# Patient Record
Sex: Male | Born: 1955
Health system: Southern US, Community
[De-identification: ages and names within clinical notes are randomized; demographics above are authoritative.]

## PROBLEM LIST (undated history)

## (undated) DIAGNOSIS — M1990 Unspecified osteoarthritis, unspecified site: Secondary | ICD-10-CM

## (undated) DIAGNOSIS — I10 Essential (primary) hypertension: Secondary | ICD-10-CM

## (undated) DIAGNOSIS — N200 Calculus of kidney: Secondary | ICD-10-CM

## (undated) DIAGNOSIS — J449 Chronic obstructive pulmonary disease, unspecified: Secondary | ICD-10-CM

## (undated) DIAGNOSIS — G4730 Sleep apnea, unspecified: Secondary | ICD-10-CM

## (undated) DIAGNOSIS — E0590 Thyrotoxicosis, unspecified without thyrotoxic crisis or storm: Secondary | ICD-10-CM

## (undated) DIAGNOSIS — E785 Hyperlipidemia, unspecified: Secondary | ICD-10-CM

## (undated) DIAGNOSIS — E039 Hypothyroidism, unspecified: Secondary | ICD-10-CM

## (undated) DIAGNOSIS — F419 Anxiety disorder, unspecified: Secondary | ICD-10-CM

## (undated) DIAGNOSIS — B1910 Unspecified viral hepatitis B without hepatic coma: Secondary | ICD-10-CM

## (undated) DIAGNOSIS — B1920 Unspecified viral hepatitis C without hepatic coma: Secondary | ICD-10-CM

## (undated) DIAGNOSIS — R0600 Dyspnea, unspecified: Secondary | ICD-10-CM

## (undated) DIAGNOSIS — K219 Gastro-esophageal reflux disease without esophagitis: Secondary | ICD-10-CM

## (undated) DIAGNOSIS — Z87442 Personal history of urinary calculi: Secondary | ICD-10-CM

## (undated) DIAGNOSIS — T24001A Burn of unspecified degree of unspecified site of right lower limb, except ankle and foot, initial encounter: Secondary | ICD-10-CM

## (undated) DIAGNOSIS — G709 Myoneural disorder, unspecified: Secondary | ICD-10-CM

## (undated) DIAGNOSIS — E119 Type 2 diabetes mellitus without complications: Secondary | ICD-10-CM

## (undated) HISTORY — DX: Hyperlipidemia, unspecified: E78.5

## (undated) HISTORY — DX: Burn of unspecified degree of unspecified site of right lower limb, except ankle and foot, initial encounter: T24.001A

## (undated) HISTORY — DX: Sleep apnea, unspecified: G47.30

## (undated) HISTORY — DX: Gastro-esophageal reflux disease without esophagitis: K21.9

## (undated) HISTORY — DX: Unspecified viral hepatitis B without hepatic coma: B19.10

## (undated) HISTORY — DX: Unspecified osteoarthritis, unspecified site: M19.90

## (undated) HISTORY — DX: Unspecified viral hepatitis C without hepatic coma: B19.20

---

## 2002-08-02 ENCOUNTER — Inpatient Hospital Stay (HOSPITAL_COMMUNITY): Admission: EM | Admit: 2002-08-02 | Discharge: 2002-08-04 | Payer: Self-pay | Admitting: Emergency Medicine

## 2002-08-02 ENCOUNTER — Encounter: Payer: Self-pay | Admitting: *Deleted

## 2002-08-02 ENCOUNTER — Encounter: Payer: Self-pay | Admitting: Emergency Medicine

## 2002-11-03 ENCOUNTER — Emergency Department (HOSPITAL_COMMUNITY): Admission: EM | Admit: 2002-11-03 | Discharge: 2002-11-03 | Payer: Self-pay | Admitting: Emergency Medicine

## 2004-01-11 ENCOUNTER — Emergency Department (HOSPITAL_COMMUNITY): Admission: EM | Admit: 2004-01-11 | Discharge: 2004-01-11 | Payer: Self-pay | Admitting: Family Medicine

## 2004-02-03 ENCOUNTER — Emergency Department (HOSPITAL_COMMUNITY): Admission: EM | Admit: 2004-02-03 | Discharge: 2004-02-03 | Payer: Self-pay | Admitting: Family Medicine

## 2005-02-24 ENCOUNTER — Emergency Department (HOSPITAL_COMMUNITY): Admission: EM | Admit: 2005-02-24 | Discharge: 2005-02-24 | Payer: Self-pay | Admitting: Emergency Medicine

## 2005-07-16 ENCOUNTER — Emergency Department (HOSPITAL_COMMUNITY): Admission: EM | Admit: 2005-07-16 | Discharge: 2005-07-16 | Payer: Self-pay | Admitting: Family Medicine

## 2010-05-14 ENCOUNTER — Inpatient Hospital Stay (INDEPENDENT_AMBULATORY_CARE_PROVIDER_SITE_OTHER)
Admission: RE | Admit: 2010-05-14 | Discharge: 2010-05-14 | Disposition: A | Discharge status: Home / Self Care | Payer: Self-pay | Source: Ambulatory Visit | Attending: Emergency Medicine | Admitting: Emergency Medicine

## 2010-05-14 DIAGNOSIS — J40 Bronchitis, not specified as acute or chronic: Secondary | ICD-10-CM

## 2010-05-14 DIAGNOSIS — I10 Essential (primary) hypertension: Secondary | ICD-10-CM

## 2010-05-14 DIAGNOSIS — F17200 Nicotine dependence, unspecified, uncomplicated: Secondary | ICD-10-CM

## 2012-11-15 ENCOUNTER — Encounter (HOSPITAL_COMMUNITY): Payer: Self-pay | Admitting: Emergency Medicine

## 2012-11-15 ENCOUNTER — Emergency Department (HOSPITAL_COMMUNITY)
Admission: EM | Admit: 2012-11-15 | Discharge: 2012-11-15 | Disposition: A | Discharge status: Home / Self Care | Payer: 59 | Attending: Emergency Medicine | Admitting: Emergency Medicine

## 2012-11-15 ENCOUNTER — Emergency Department (HOSPITAL_COMMUNITY): Payer: 59

## 2012-11-15 DIAGNOSIS — R42 Dizziness and giddiness: Secondary | ICD-10-CM | POA: Insufficient documentation

## 2012-11-15 DIAGNOSIS — I10 Essential (primary) hypertension: Secondary | ICD-10-CM

## 2012-11-15 DIAGNOSIS — F17200 Nicotine dependence, unspecified, uncomplicated: Secondary | ICD-10-CM | POA: Insufficient documentation

## 2012-11-15 DIAGNOSIS — R110 Nausea: Secondary | ICD-10-CM | POA: Insufficient documentation

## 2012-11-15 LAB — PROTIME-INR: Prothrombin Time: 12.4 seconds (ref 11.6–15.2)

## 2012-11-15 LAB — CBC
Hemoglobin: 16.6 g/dL (ref 13.0–17.0)
MCH: 31.5 pg (ref 26.0–34.0)
MCHC: 36.2 g/dL — ABNORMAL HIGH (ref 30.0–36.0)
MCV: 87.1 fL (ref 78.0–100.0)
Platelets: 170 10*3/uL (ref 150–400)
RBC: 5.27 MIL/uL (ref 4.22–5.81)
WBC: 7.2 10*3/uL (ref 4.0–10.5)

## 2012-11-15 LAB — COMPREHENSIVE METABOLIC PANEL
ALT: 100 U/L — ABNORMAL HIGH (ref 0–53)
Alkaline Phosphatase: 63 U/L (ref 39–117)
BUN: 14 mg/dL (ref 6–23)
CO2: 23 mEq/L (ref 19–32)
Calcium: 9.1 mg/dL (ref 8.4–10.5)
GFR calc Af Amer: >90 mL/min (ref >90)
GFR calc non Af Amer: >90 mL/min (ref >90)
Glucose, Bld: 111 mg/dL — ABNORMAL HIGH (ref 70–99)
Potassium: 4.1 mEq/L (ref 3.5–5.1)
Total Protein: 7.7 g/dL (ref 6.0–8.3)

## 2012-11-15 LAB — POCT I-STAT, CHEM 8
Chloride: 106 mEq/L (ref 96–112)
Creatinine, Ser: 1.1 mg/dL (ref 0.50–1.35)
Glucose, Bld: 110 mg/dL — ABNORMAL HIGH (ref 70–99)
HCT: 51 % (ref 39.0–52.0)
Hemoglobin: 17.3 g/dL — ABNORMAL HIGH (ref 13.0–17.0)
Potassium: 4.1 mEq/L (ref 3.5–5.1)
TCO2: 24 mmol/L (ref 0–100)

## 2012-11-15 LAB — TROPONIN I: Troponin I: <0.3 ng/mL (ref <0.30)

## 2012-11-15 LAB — DIFFERENTIAL
Eosinophils Absolute: 0 10*3/uL (ref 0.0–0.7)
Eosinophils Relative: 1 % (ref 0–5)
Lymphocytes Relative: 31 % (ref 12–46)
Lymphs Abs: 2.2 10*3/uL (ref 0.7–4.0)
Monocytes Relative: 12 % (ref 3–12)

## 2012-11-15 MED ORDER — MECLIZINE HCL 25 MG PO TABS
25.0000 mg | ORAL_TABLET | Freq: Once | ORAL | Status: AC
  Administered 2012-11-15: 25 mg via ORAL
  Filled 2012-11-15: qty 1

## 2012-11-15 MED ORDER — MECLIZINE HCL 25 MG PO TABS: 25.0000 mg | ORAL_TABLET | Freq: Three times a day (TID) | ORAL | Status: DC | PRN

## 2012-11-15 NOTE — ED Notes (Signed)
Pt reports having dizziness "feels like room is spinning" since Friday. Reports bp 230/110 at home, bp 166/90 at triage. Denies any slurred speech, n/v, or weakness. ekg done at triage.

## 2012-11-15 NOTE — ED Notes (Signed)
Pt taken to CT.

## 2012-11-15 NOTE — ED Provider Notes (Signed)
CSN: [id number]     Arrival date & time 11/15/12  1732 History   First MD Initiated Contact with Patient 11/15/12 1803     Chief Complaint  Patient presents with  . Dizziness  . Hypertension   (Consider location/radiation/quality/duration/timing/severity/associated sxs/prior Treatment) HPI Comments: Patient is a 57 year old male otherwise healthy presents to the emergency department with complaints of dizziness that started this morning after he woke up. States that he feels as if the room is spinning and the sensation is associated with nausea. He has had this in the past however this episode is worse. His wife checked his blood pressure with a portable machine at home and got 230/110. She is very concerned about this and insisted he come to be evaluated. Patient denies to me he is having any chest pain or shortness of breath. He denies any headache. He reports his symptoms are worse with position and movement and are improved with lying still. He denies any ringing in the ears or hearing loss.  Patient is a 57 y.o. male presenting with hypertension. The history is provided by the patient.  Hypertension This is a new problem. The problem occurs constantly. The problem has been gradually worsening. Pertinent negatives include no chest pain. Nothing aggravates the symptoms. Nothing relieves the symptoms. He has tried nothing for the symptoms. The treatment provided no relief.    History reviewed. No pertinent past medical history. History reviewed. No pertinent past surgical history. History reviewed. No pertinent family history. History  Substance Use Topics  . Smoking status: Current Every Day Smoker    Types: Cigarettes  . Smokeless tobacco: Not on file  . Alcohol Use: Yes     Comment: occ    Review of Systems  Cardiovascular: Negative for chest pain.  All other systems reviewed and are negative.    Allergies  Chocolate  Home Medications  No current outpatient prescriptions  on file. BP 166/90  Pulse 94  Temp(Src) 98.1 F (36.7 C) (Oral)  Resp 18  SpO2 94% Physical Exam  Nursing note and vitals reviewed. Constitutional: He is oriented to person, place, and time. He appears well-developed and well-nourished. No distress.  HENT:  Head: Normocephalic and atraumatic.  Mouth/Throat: Oropharynx is clear and moist.  Eyes: EOM are normal. Pupils are equal, round, and reactive to light.  Neck: Normal range of motion.  Cardiovascular: Normal rate, regular rhythm and normal heart sounds.   No murmur heard. Pulmonary/Chest: Effort normal. No respiratory distress. He has no wheezes.  Abdominal: Soft. Bowel sounds are normal. He exhibits no distension. There is no tenderness.  Musculoskeletal: Normal range of motion. He exhibits no edema.  Lymphadenopathy:    He has no cervical adenopathy.  Neurological: He is alert and oriented to person, place, and time. No cranial nerve deficit. He exhibits normal muscle tone. Coordination normal.  Skin: Skin is warm and dry. He is not diaphoretic.    ED Course  Procedures (including critical care time) Labs Review Labs Reviewed  PROTIME-INR  APTT  CBC  DIFFERENTIAL  COMPREHENSIVE METABOLIC PANEL  TROPONIN I   Imaging Review No results found.  EKG Interpretation     Ventricular Rate:  91 PR Interval:  142 QRS Duration: 96 QT Interval:  364 QTC Calculation: 447 R Axis:   50 Text Interpretation:  Normal sinus rhythm Incomplete right bundle branch block Nonspecific ST abnormality Abnormal ECG            MDM  No diagnosis found. Patient is a  58 year old male presents with complaints of dizziness that he describes as a spinning sensation. His blood pressure than running high at home however I think this is more the product of him not feeling well rather than the cause of him not feeling well. Workup today reveals a normal head CT and laboratory studies are all essentially unremarkable. He is feeling better  with meclizine and I feel as though he is stable for discharge. His blood pressure when I reexamined him was 158/98 and I do not feel is in need of any emergent intervention. He will be discharged with meclizine and instructions to followup with his primary Dr. and keep a record of his blood pressures for him to see. He is advised to return if he develops any new or bothersome symptoms.    Geoffery Lyons, MD 11/15/12 2044

## 2012-11-15 NOTE — ED Notes (Signed)
Dr. Delo at bedside. 

## 2012-12-13 ENCOUNTER — Ambulatory Visit (HOSPITAL_COMMUNITY)
Admission: RE | Admit: 2012-12-13 | Discharge: 2012-12-13 | Disposition: A | Discharge status: Home / Self Care | Payer: 59 | Source: Ambulatory Visit | Attending: Internal Medicine | Admitting: Internal Medicine

## 2012-12-13 ENCOUNTER — Other Ambulatory Visit: Payer: Self-pay | Admitting: Internal Medicine

## 2012-12-13 DIAGNOSIS — J40 Bronchitis, not specified as acute or chronic: Secondary | ICD-10-CM

## 2012-12-13 DIAGNOSIS — R059 Cough, unspecified: Secondary | ICD-10-CM | POA: Insufficient documentation

## 2012-12-13 DIAGNOSIS — R05 Cough: Secondary | ICD-10-CM | POA: Insufficient documentation

## 2013-02-02 ENCOUNTER — Emergency Department (INDEPENDENT_AMBULATORY_CARE_PROVIDER_SITE_OTHER): Payer: 59

## 2013-02-02 ENCOUNTER — Encounter (HOSPITAL_COMMUNITY): Payer: Self-pay | Admitting: Emergency Medicine

## 2013-02-02 ENCOUNTER — Emergency Department (INDEPENDENT_AMBULATORY_CARE_PROVIDER_SITE_OTHER)
Admission: EM | Admit: 2013-02-02 | Discharge: 2013-02-02 | Disposition: A | Discharge status: Home / Self Care | Payer: BC Managed Care – PPO | Source: Home / Self Care | Attending: Family Medicine | Admitting: Family Medicine

## 2013-02-02 DIAGNOSIS — M659 Synovitis and tenosynovitis, unspecified: Secondary | ICD-10-CM

## 2013-02-02 DIAGNOSIS — M65839 Other synovitis and tenosynovitis, unspecified forearm: Secondary | ICD-10-CM

## 2013-02-02 HISTORY — DX: Essential (primary) hypertension: I10

## 2013-02-02 MED ORDER — PREDNISONE (PAK) 10 MG PO TABS: ORAL_TABLET | Freq: Every day | ORAL | Status: DC

## 2013-02-02 MED ORDER — HYDROCODONE-ACETAMINOPHEN 5-325 MG PO TABS: 1.0000 | ORAL_TABLET | Freq: Four times a day (QID) | ORAL | Status: DC | PRN

## 2013-02-02 NOTE — ED Provider Notes (Signed)
CSN: [id number]     Arrival date & time 02/02/13  1130 History   First MD Initiated Contact with Patient 02/02/13 1439     No chief complaint on file.  (Consider location/radiation/quality/duration/timing/severity/associated sxs/prior Treatment) Patient is a 57 y.o. male presenting with wrist pain. The history is provided by the patient.  Wrist Pain This is a new problem. The current episode started yesterday. The problem occurs constantly. The problem has been gradually worsening. Exacerbated by: movement, pressure. Nothing relieves the symptoms. He has tried nothing for the symptoms.   Gabriel Coleman is a 57 y.o. male who presents with left wrist pain that started yesterday. The pain is worse today with redness and swelling. The pain and redness extends to the hand. No know injury to the area, no history of gout.   No past medical history on file. No past surgical history on file. No family history on file. History  Substance Use Topics  . Smoking status: Current Every Day Smoker    Types: Cigarettes  . Smokeless tobacco: Not on file  . Alcohol Use: Yes     Comment: occ    Review of Systems Negative except as stated in HPI  Allergies  Chocolate  Home Medications   Current Outpatient Rx  Name  Route  Sig  Dispense  Refill  . meclizine (ANTIVERT) 25 MG tablet   Oral   Take 1 tablet (25 mg total) by mouth 3 (three) times daily as needed.   15 tablet   0    BP 145/90  Pulse 65  Temp(Src) 97.9 F (36.6 C) (Oral)  Resp 16  SpO2 98% Physical Exam  Nursing note and vitals reviewed. Constitutional: He is oriented to person, place, and time. He appears well-developed and well-nourished. No distress.  HENT:  Head: Normocephalic and atraumatic.  Eyes: Conjunctivae and EOM are normal.  Neck: Normal range of motion. Neck supple.  Cardiovascular: Normal rate.   Pulmonary/Chest: Effort normal.  Abdominal: Soft. There is no tenderness.  Musculoskeletal: Normal range of  motion.       Left wrist: He exhibits tenderness and swelling. He exhibits normal range of motion, no deformity and no laceration.       Arms: There is swelling, erythema, increased warmth and tenderness to the left wrist extending to the dorsum of the left hand. There is point tenderness as well to the radial aspect of the left wrist. Pain with range of motion. Radial pulse strong, adequate circulation, good touch sensation.  Neurological: He is alert and oriented to person, place, and time. No cranial nerve deficit.  Skin: Skin is warm and dry.  Psychiatric: He has a normal mood and affect. His behavior is normal.    ED Course  Procedures Dg Wrist Complete Left  02/02/2013   EXAM: LEFT WRIST - COMPLETE 3+ VIEW  COMPARISON:  None.  FINDINGS: Bony density is noted adjacent to the radial styloid and trapezium. This could represent a tiny fracture chip. It appears corticated, this is most likely old. Degenerative changes are present. No acute fracture or evidence of dislocation.  IMPRESSION: Degenerative changes. Old fracture arising from the radial styloid or effusion. No acute abnormality .   Electronically Signed   By: Maisie Fus  Register   On: 02/02/2013 15:08     MDM  Dr. Denyse Coleman in to examine the patient and review x-rays. Patient with swollen, tender, warm left wrist. Consider gout, arthritis, tenosynovitis. Will treat with steroids and pain management. Placed in thumb spica  splint. He is to follow up with Dr. Mina Coleman for further evaluation. I have reviewed this patient's vital signs, nurses notes, appropriate imaging and discussed findings and plan of care with the patient. He voices understanding.         [facility] Gabriel Och, NP 02/02/13 1540

## 2013-02-02 NOTE — ED Provider Notes (Signed)
Medical screening examination/treatment/procedure(s) were performed by a resident physician or non-physician practitioner and as the supervising physician I was immediately available for consultation/collaboration.  Evan Corey, MD    Evan S Corey, MD 02/02/13 2126 

## 2013-02-02 NOTE — ED Notes (Signed)
Wrist pain onset yesterday, no known injury.  Has taken advil for pain

## 2013-06-07 ENCOUNTER — Emergency Department (INDEPENDENT_AMBULATORY_CARE_PROVIDER_SITE_OTHER)
Admission: EM | Admit: 2013-06-07 | Discharge: 2013-06-07 | Disposition: A | Discharge status: Home / Self Care | Payer: BC Managed Care – PPO | Source: Home / Self Care | Attending: Emergency Medicine | Admitting: Emergency Medicine

## 2013-06-07 ENCOUNTER — Emergency Department (INDEPENDENT_AMBULATORY_CARE_PROVIDER_SITE_OTHER): Payer: BC Managed Care – PPO

## 2013-06-07 ENCOUNTER — Encounter (HOSPITAL_COMMUNITY): Payer: Self-pay | Admitting: Emergency Medicine

## 2013-06-07 DIAGNOSIS — R42 Dizziness and giddiness: Secondary | ICD-10-CM

## 2013-06-07 LAB — POCT I-STAT, CHEM 8
BUN: 13 mg/dL (ref 6–23)
CALCIUM ION: 1.23 mmol/L (ref 1.12–1.23)
Chloride: 106 mEq/L (ref 96–112)
Creatinine, Ser: 0.9 mg/dL (ref 0.50–1.35)
Glucose, Bld: 104 mg/dL — ABNORMAL HIGH (ref 70–99)
HEMATOCRIT: 52 % (ref 39.0–52.0)
Hemoglobin: 17.7 g/dL — ABNORMAL HIGH (ref 13.0–17.0)
POTASSIUM: 4.1 meq/L (ref 3.7–5.3)
Sodium: 139 mEq/L (ref 137–147)
TCO2: 22 mmol/L (ref 0–100)

## 2013-06-07 MED ORDER — MECLIZINE HCL 25 MG PO TABS: 25.0000 mg | ORAL_TABLET | Freq: Three times a day (TID) | ORAL | Status: DC | PRN

## 2013-06-07 NOTE — Discharge Instructions (Signed)

## 2013-06-07 NOTE — ED Notes (Signed)
C/o  Feeling dizzy with sitting, standing, and lying since Saturday.  Hx of vertigo.  Denies n/v

## 2013-06-07 NOTE — ED Provider Notes (Signed)
Chief Complaint   Chief Complaint  Patient presents with  . Dizziness    History of Present Illness   Gabriel Coleman is a 58 year old male who woke up this past Saturday which was 3 days ago with dizziness. He denies any true whirling vertigo or pre-syncope. He describes a dull headache, like "my head is in a vice." He felt off-balance and lightheaded. He denied any presyncope. The dizziness was worse if he would sit, stand, rollover, move, or cough. He checked his blood pressure at home and found it to be elevated as high as 230/116. He is on blood pressure medicines, namely lisinopril. He has a history of vertigo in the past. He feels tired and run down. There is no recent head trauma although he did hit his head about 6 years ago. He's short of breath with exertion, but he is a cigarette smoker. He's had some loose stools but no vomiting. He has coughed up some blood. He denies any fever, chills, diplopia, blurry vision, or hearing problems. He has had no chest pain, palpitations, or abdominal pain. He denies any paresthesias, weakness, or difficulty with speech, swelling, or ambulation.  Review of Systems   Other than noted above, the patient denies any of the following symptoms: Systemic:  No fever, chills, fatigue, or weight loss. Eye:  No blurred vision, visual change or diplopia. ENT:  No ear pain, tinnitus, hearing loss, nasal congestion, or rhinorrhea. Cardiac:  No chest pain, dyspnea, palpitations or syncope. Neuro:  No headache, paresthesias, weakness, trouble with speech, coordination or ambulation.  [place]   Past medical history, family history, social history, meds, and allergies were reviewed.  His only medications are aspirin and lisinopril for high blood pressure.  Physical Examination     Vital signs:  BP 149/104  Pulse 67  Temp(Src) 98.6 F (37 C) (Oral)  Resp 16  SpO2 97% Filed Vitals:   06/07/13 1349 Supine 06/07/13 1351 Sitting 06/07/13 1353 Standing  BP:  140/94 150/126 149/104  Pulse: 69 71 67  Temp: 98.6 F (37 C)    TempSrc: Oral    Resp: 16    SpO2: 97%     General:  Alert, oriented times 3, in no distress. Eye:  PERRL, full EOM, no nystagmus. ENT:  TMs and canals normal.  Nasal mucosa normal.  Pharynx clear. Neck:  No adenopathy, tenderness, or mass.  Thyroid normal.  No carotid bruit. Lungs:  Breath sounds clear and equal bilaterally.  No wheezes, rales or rhonchi. Heart:  Regular rhythm.  No gallops, murmers, or rubs. Neuro:  Alert and oriented times 3.  Cranial nerves intact.  No pronator drift.  Finger to nose normal.  No focal weakness.  Sensation intact to light touch.  Romberg's sign negative, gait normal.  Able to do tandem gait well.  Dix-Hallpike maneuver was negative.   Labs   Results for orders placed during the hospital encounter of 06/07/13  POCT I-STAT, CHEM 8      Result Value Ref Range   Sodium 139  137 - 147 mEq/L   Potassium 4.1  3.7 - 5.3 mEq/L   Chloride 106  96 - 112 mEq/L   BUN 13  6 - 23 mg/dL   Creatinine, Ser 0.90  0.50 - 1.35 mg/dL   Glucose, Bld 104 (*) 70 - 99 mg/dL   Calcium, Ion 1.23  1.12 - 1.23 mmol/L   TCO2 22  0 - 100 mmol/L   Hemoglobin 17.7 (*) 13.0 - 17.0  g/dL   HCT 52.0  39.0 - 52.0 %     EKG Results:  Date: 06/07/2013  Rate: 75  Rhythm: normal sinus rhythm  QRS Axis: normal  Intervals: normal  ST/T Wave abnormalities: normal  Conduction Disutrbances:none  Narrative Interpretation: Normal sinus rhythm, normal EKG.  Old EKG Reviewed: none available   Assessment   The encounter diagnosis was Dizziness.  Dizziness of uncertain cause. May be due to vertigo such as benign positional vertigo or acute labyrinthitis. Is no definite evidence of this. He's not pre-syncopal. There is no evidence of the CVA or cardiac cause for the dizziness. Will treat with Antivert, and have them follow up with his primary care physician for the elevated blood pressure.  Plan     1.  Meds:  The  following meds were prescribed:   Discharge Medication List as of 06/07/2013  3:16 PM    START taking these medications   Details  !! meclizine (ANTIVERT) 25 MG tablet Take 1 tablet (25 mg total) by mouth 3 (three) times daily as needed for dizziness., Starting 06/07/2013, Until Discontinued, Normal     !! - Potential duplicate medications found. Please discuss with provider.      2.  Patient Education/Counseling:  The patient was given appropriate handouts, self care instructions, and instructed in symptomatic relief.  Continue on with current meds and follow up with his primary care physician with regard to his blood pressure within the next few days.  3.  Follow up:  The patient was told to follow up here if no better in 3 to 4 days, or sooner if becoming worse in any way, and given some red flag symptoms such as new neurological symptoms, chest pain, or syncope which would prompt immediate return.       Harden Mo, MD 06/07/13 [phone]

## 2013-07-06 ENCOUNTER — Other Ambulatory Visit: Payer: Self-pay | Admitting: Nurse Practitioner

## 2013-07-06 DIAGNOSIS — C220 Liver cell carcinoma: Secondary | ICD-10-CM

## 2013-08-04 ENCOUNTER — Encounter (INDEPENDENT_AMBULATORY_CARE_PROVIDER_SITE_OTHER): Payer: Self-pay

## 2013-08-04 ENCOUNTER — Ambulatory Visit
Admission: RE | Admit: 2013-08-04 | Discharge: 2013-08-04 | Disposition: A | Discharge status: Home / Self Care | Payer: BC Managed Care – PPO | Source: Ambulatory Visit | Attending: Nurse Practitioner | Admitting: Nurse Practitioner

## 2013-08-04 DIAGNOSIS — C220 Liver cell carcinoma: Secondary | ICD-10-CM

## 2013-09-20 ENCOUNTER — Other Ambulatory Visit (HOSPITAL_COMMUNITY): Payer: Self-pay | Admitting: Internal Medicine

## 2013-09-20 ENCOUNTER — Ambulatory Visit (HOSPITAL_COMMUNITY)
Admission: RE | Admit: 2013-09-20 | Discharge: 2013-09-20 | Disposition: A | Discharge status: Home / Self Care | Payer: BC Managed Care – PPO | Source: Ambulatory Visit | Attending: Internal Medicine | Admitting: Internal Medicine

## 2013-09-20 DIAGNOSIS — J449 Chronic obstructive pulmonary disease, unspecified: Secondary | ICD-10-CM

## 2013-09-20 DIAGNOSIS — R0789 Other chest pain: Secondary | ICD-10-CM | POA: Diagnosis present

## 2013-09-21 ENCOUNTER — Other Ambulatory Visit (HOSPITAL_COMMUNITY): Payer: Self-pay | Admitting: Internal Medicine

## 2013-09-21 DIAGNOSIS — I739 Peripheral vascular disease, unspecified: Secondary | ICD-10-CM

## 2013-09-22 ENCOUNTER — Ambulatory Visit (HOSPITAL_COMMUNITY): Payer: BC Managed Care – PPO | Attending: Internal Medicine

## 2014-11-24 ENCOUNTER — Other Ambulatory Visit (HOSPITAL_COMMUNITY): Payer: Self-pay | Admitting: Nurse Practitioner

## 2014-11-24 DIAGNOSIS — B182 Chronic viral hepatitis C: Secondary | ICD-10-CM

## 2014-12-14 ENCOUNTER — Ambulatory Visit (HOSPITAL_COMMUNITY)
Admission: RE | Admit: 2014-12-14 | Discharge: 2014-12-14 | Disposition: A | Discharge status: Home / Self Care | Payer: BLUE CROSS/BLUE SHIELD | Source: Ambulatory Visit | Attending: Nurse Practitioner | Admitting: Nurse Practitioner

## 2014-12-14 DIAGNOSIS — B182 Chronic viral hepatitis C: Secondary | ICD-10-CM | POA: Insufficient documentation

## 2015-04-03 ENCOUNTER — Emergency Department (INDEPENDENT_AMBULATORY_CARE_PROVIDER_SITE_OTHER): Payer: BLUE CROSS/BLUE SHIELD

## 2015-04-03 ENCOUNTER — Encounter (HOSPITAL_COMMUNITY): Payer: Self-pay | Admitting: Emergency Medicine

## 2015-04-03 ENCOUNTER — Emergency Department (INDEPENDENT_AMBULATORY_CARE_PROVIDER_SITE_OTHER)
Admission: EM | Admit: 2015-04-03 | Discharge: 2015-04-03 | Disposition: A | Discharge status: Home / Self Care | Payer: BLUE CROSS/BLUE SHIELD | Source: Home / Self Care | Attending: Family Medicine | Admitting: Family Medicine

## 2015-04-03 DIAGNOSIS — J9801 Acute bronchospasm: Secondary | ICD-10-CM

## 2015-04-03 DIAGNOSIS — J441 Chronic obstructive pulmonary disease with (acute) exacerbation: Secondary | ICD-10-CM

## 2015-04-03 DIAGNOSIS — J111 Influenza due to unidentified influenza virus with other respiratory manifestations: Secondary | ICD-10-CM

## 2015-04-03 HISTORY — DX: Chronic obstructive pulmonary disease, unspecified: J44.9

## 2015-04-03 MED ORDER — IBUPROFEN 800 MG PO TABS
800.0000 mg | ORAL_TABLET | Freq: Once | ORAL | Status: AC
  Administered 2015-04-03: 800 mg via ORAL

## 2015-04-03 MED ORDER — IPRATROPIUM-ALBUTEROL 0.5-2.5 (3) MG/3ML IN SOLN
RESPIRATORY_TRACT | Status: AC
  Filled 2015-04-03: qty 3

## 2015-04-03 MED ORDER — IPRATROPIUM-ALBUTEROL 0.5-2.5 (3) MG/3ML IN SOLN
3.0000 mL | Freq: Once | RESPIRATORY_TRACT | Status: AC
  Administered 2015-04-03: 3 mL via RESPIRATORY_TRACT

## 2015-04-03 MED ORDER — PREDNISONE 20 MG PO TABS: ORAL_TABLET | ORAL | Status: DC

## 2015-04-03 MED ORDER — IBUPROFEN 800 MG PO TABS
ORAL_TABLET | ORAL | Status: AC
  Filled 2015-04-03: qty 1

## 2015-04-03 MED ORDER — ALBUTEROL SULFATE HFA 108 (90 BASE) MCG/ACT IN AERS: 2.0000 | INHALATION_SPRAY | RESPIRATORY_TRACT | Status: DC | PRN

## 2015-04-03 MED ORDER — ALBUTEROL SULFATE (2.5 MG/3ML) 0.083% IN NEBU
2.5000 mg | INHALATION_SOLUTION | Freq: Once | RESPIRATORY_TRACT | Status: AC
  Administered 2015-04-03: 2.5 mg via RESPIRATORY_TRACT

## 2015-04-03 MED ORDER — ALBUTEROL SULFATE (2.5 MG/3ML) 0.083% IN NEBU
INHALATION_SOLUTION | RESPIRATORY_TRACT | Status: AC
  Filled 2015-04-03: qty 3

## 2015-04-03 NOTE — ED Notes (Signed)
Onset Saturday night of chills, cough, fever.  Cough production has slowed, but frequently coughing.  Reports runny nose, not stuffy nose.  No ear pain.  Complains of sore throat.  Diarrhea episodes x 2

## 2015-04-03 NOTE — Discharge Instructions (Signed)
Influenza, Adult For nasal and head congestion may take Sudafed PE 10 mg every 4 hours as needed. Saline nasal spray used frequently. For drainage may use Allegra, Claritin or Zyrtec. If you need stronger medicine to stop drainage may take Chlor-Trimeton 2-4 mg every 4 hours. This may cause drowsiness. Ibuprofen 600 mg every 6 hours as needed for pain, discomfort or fever. Drink plenty of fluids and stay well-hydrated.  Influenza ("the flu") is a viral infection of the respiratory tract. It occurs more often in winter months because people spend more time in close contact with one another. Influenza can make you feel very sick. Influenza easily spreads from person to person (contagious). CAUSES  Influenza is caused by a virus that infects the respiratory tract. You can catch the virus by breathing in droplets from an infected person's cough or sneeze. You can also catch the virus by touching something that was recently contaminated with the virus and then touching your mouth, nose, or eyes. RISKS AND COMPLICATIONS You may be at risk for a more severe case of influenza if you smoke cigarettes, have diabetes, have chronic heart disease (such as heart failure) or lung disease (such as asthma), or if you have a weakened immune system. Elderly people and pregnant women are also at risk for more serious infections. The most common problem of influenza is a lung infection (pneumonia). Sometimes, this problem can require emergency medical care and may be life threatening. SIGNS AND SYMPTOMS  Symptoms typically last 4 to 10 days and may include:  Fever.  Chills.  Headache, body aches, and muscle aches.  Sore throat.  Chest discomfort and cough.  Poor appetite.  Weakness or feeling tired.  Dizziness.  Nausea or vomiting. DIAGNOSIS  Diagnosis of influenza is often made based on your history and a physical exam. A nose or throat swab test can be done to confirm the diagnosis. TREATMENT  In  mild cases, influenza goes away on its own. Treatment is directed at relieving symptoms. For more severe cases, your health care provider may prescribe antiviral medicines to shorten the sickness. Antibiotic medicines are not effective because the infection is caused by a virus, not by bacteria. HOME CARE INSTRUCTIONS  Take medicines only as directed by your health care provider.  Use a cool mist humidifier to make breathing easier.  Get plenty of rest until your temperature returns to normal. This usually takes 3 to 4 days.  Drink enough fluid to keep your urine clear or pale yellow.  Cover yourmouth and nosewhen coughing or sneezing,and wash your handswellto prevent thevirusfrom spreading.  Stay homefromwork orschool untilthe fever is gonefor at least 59full day. PREVENTION  An annual influenza vaccination (flu shot) is the best way to avoid getting influenza. An annual flu shot is now routinely recommended for all adults in the [place] IF:  You experiencechest pain, yourcough worsens,or you producemore mucus.  Youhave nausea,vomiting, ordiarrhea.  Your fever returns or gets worse. SEEK IMMEDIATE MEDICAL CARE IF:  You havetrouble breathing, you become short of breath,or your skin ornails becomebluish.  You have severe painor stiffnessin the neck.  You develop a sudden headache, or pain in the face or ear.  You have nausea or vomiting that you cannot control. MAKE SURE YOU:   Understand these instructions.  Will watch your condition.  Will get help right away if you are not doing well or get worse.   This information is not intended to replace advice given to you by  your health care provider. Make sure you discuss any questions you have with your health care provider.   Document Released: 01/19/2000 Document Revised: 02/11/2014 Document Reviewed: 04/22/2011 Elsevier Interactive Patient Education 2016 Elsevier  Inc[employer]     Bronchospasm, Adult Albuterol HFA inhaler 2 puffs every 4 to 6 hours for cough and wheeze Prednisone taper dose as directed. A bronchospasm is a spasm or tightening of the airways going into the lungs. During a bronchospasm breathing becomes more difficult because the airways get smaller. When this happens there can be coughing, a whistling sound when breathing (wheezing), and difficulty breathing. Bronchospasm is often associated with asthma, but not all patients who experience a bronchospasm have asthma. CAUSES  A bronchospasm is caused by inflammation or irritation of the airways. The inflammation or irritation may be triggered by:   Allergies (such as to animals, pollen, food, or mold). Allergens that cause bronchospasm may cause wheezing immediately after exposure or many hours later.   Infection. Viral infections are believed to be the most common cause of bronchospasm.   Exercise.   Irritants (such as pollution, cigarette smoke, strong odors, aerosol sprays, and paint fumes).   Weather changes. Winds increase molds and pollens in the air. Rain refreshes the air by washing irritants out. Cold air may cause inflammation.   Stress and emotional upset.  SIGNS AND SYMPTOMS   Wheezing.   Excessive nighttime coughing.   Frequent or severe coughing with a simple cold.   Chest tightness.   Shortness of breath.  DIAGNOSIS  Bronchospasm is usually diagnosed through a history and physical exam. Tests, such as chest X-rays, are sometimes done to look for other conditions. TREATMENT   Inhaled medicines can be given to open up your airways and help you breathe. The medicines can be given using either an inhaler or a nebulizer machine.  Corticosteroid medicines may be given for severe bronchospasm, usually when it is associated with asthma. HOME CARE INSTRUCTIONS   Always have a plan prepared for seeking medical care. Know when to call your health care provider  and local emergency services (911 in the [place] Know where you can access local emergency care.  Only take medicines as directed by your health care provider.  If you were prescribed an inhaler or nebulizer machine, ask your health care provider to explain how to use it correctly. Always use a spacer with your inhaler if you were given one.  It is necessary to remain calm during an attack. Try to relax and breathe more slowly.  Control your home environment in the following ways:   Change your heating and air conditioning filter at least once a month.   Limit your use of fireplaces and wood stoves.  Do not smoke and do not allow smoking in your home.   Avoid exposure to perfumes and fragrances.   Get rid of pests (such as roaches and mice) and their droppings.   Throw away plants if you see mold on them.   Keep your house clean and dust free.   Replace carpet with wood, tile, or vinyl flooring. Carpet can trap dander and dust.   Use allergy-proof pillows, mattress covers, and box spring covers.   Wash bed sheets and blankets every week in hot water and dry them in a dryer.   Use blankets that are made of polyester or cotton.   Wash hands frequently. SEEK MEDICAL CARE IF:   You have muscle aches.   You have chest pain.   The  sputum changes from clear or white to yellow, green, gray, or bloody.   The sputum you cough up gets thicker.   There are problems that may be related to the medicine you are given, such as a rash, itching, swelling, or trouble breathing.  SEEK IMMEDIATE MEDICAL CARE IF:   You have worsening wheezing and coughing even after taking your prescribed medicines.   You have increased difficulty breathing.   You develop severe chest pain. MAKE SURE YOU:   Understand these instructions.  Will watch your condition.  Will get help right away if you are not doing well or get worse.   This information is not intended to replace  advice given to you by your health care provider. Make sure you discuss any questions you have with your health care provider.   Document Released: 01/24/2003 Document Revised: 02/11/2014 Document Reviewed: 07/13/2012 Elsevier Interactive Patient Education 2016 Elsevier Inc.  Chronic Obstructive Pulmonary Disease Exacerbation Chronic obstructive pulmonary disease (COPD) is a common lung condition in which airflow from the lungs is limited. COPD is a general term that can be used to describe many different lung problems that limit airflow, including chronic bronchitis and emphysema. COPD exacerbations are episodes when breathing symptoms become much worse and require extra treatment. Without treatment, COPD exacerbations can be life threatening, and frequent COPD exacerbations can cause further damage to your lungs. CAUSES  Respiratory infections.  Exposure to smoke.  Exposure to air pollution, chemical fumes, or dust. Sometimes there is no apparent cause or trigger. RISK FACTORS  Smoking cigarettes.  Older age.  Frequent prior COPD exacerbations. SIGNS AND SYMPTOMS  Increased coughing.  Increased thick spit (sputum) production.  Increased wheezing.  Increased shortness of breath.  Rapid breathing.  Chest tightness. DIAGNOSIS Your medical history, a physical exam, and tests will help your health care provider make a diagnosis. Tests may include:  A chest X-ray.  Basic lab tests.  Sputum testing.  An arterial blood gas test. TREATMENT Depending on the severity of your COPD exacerbation, you may need to be admitted to a hospital for treatment. Some of the treatments commonly used to treat COPD exacerbations are:   Antibiotic medicines.  Bronchodilators. These are drugs that expand the air passages. They may be given with an inhaler or nebulizer. Spacer devices may be needed to help improve drug delivery.  Corticosteroid medicines.  Supplemental oxygen  therapy.  Airway clearing techniques, such as noninvasive ventilation (NIV) and positive expiratory pressure (PEP). These provide respiratory support through a mask or other noninvasive device. HOME CARE INSTRUCTIONS  Do not smoke. Quitting smoking is very important to prevent COPD from getting worse and exacerbations from happening as often.  Avoid exposure to all substances that irritate the airway, especially to tobacco smoke.  If you were prescribed an antibiotic medicine, finish it all even if you start to feel better.  Take all medicines as directed by your health care provider.It is important to use correct technique with inhaled medicines.  Drink enough fluids to keep your urine clear or pale yellow (unless you have a medical condition that requires fluid restriction).  Use a cool mist vaporizer. This makes it easier to clear your chest when you cough.  If you have a home nebulizer and oxygen, continue to use them as directed.  Maintain all necessary vaccinations to prevent infections.  Exercise regularly.  Eat a healthy diet.  Keep all follow-up appointments as directed by your health care provider. SEEK IMMEDIATE MEDICAL CARE IF:  You have worsening shortness of breath.  You have trouble talking.  You have severe chest pain.  You have blood in your sputum.  You have a fever.  You have weakness, vomit repeatedly, or faint.  You feel confused.  You continue to get worse. MAKE SURE YOU:  Understand these instructions.  Will watch your condition.  Will get help right away if you are not doing well or get worse.   This information is not intended to replace advice given to you by your health care provider. Make sure you discuss any questions you have with your health care provider.   Document Released: 11/18/2006 Document Revised: 02/11/2014 Document Reviewed: 09/25/2012 Elsevier Interactive Patient Education [employer].

## 2015-04-03 NOTE — ED Provider Notes (Signed)
CSN: [id number]     Arrival date & time 04/03/15  1522 History   First MD Initiated Contact with Patient 04/03/15 1653     Chief Complaint  Patient presents with  . Influenza   (Consider location/radiation/quality/duration/timing/severity/associated sxs/prior Treatment) HPI Comments: 60 year old male is wife states that he got sick Saturday which was 2 days ago. His complaining of mild sore throat, upper respiratory congestion, nasal stuffiness, runny nose, PND, cough and fever. Temperature currently 102.4. He states that he has a history of COPD and current smoking but has not noticed any increase in shortness of breath. Although he wheezes on a regular basis, according to his wife, he does not self treat with a nebulizer or inhaler. Does not have the meds.  Denies abdominal pain, vomiting or nausea. He has had 2 episodes of loose stools today. Onset rather sudden 48 h ago. Sx's progressive. Myalgias, low back pains, described as achy.    Past Medical History  Diagnosis Date  . Hypertension   . COPD (chronic obstructive pulmonary disease) ([place])    History reviewed. No pertinent past surgical history. No family history on file. Social History  Substance Use Topics  . Smoking status: Current Every Day Smoker    Types: Cigarettes  . Smokeless tobacco: None  . Alcohol Use: Yes     Comment: occ    Review of Systems  Constitutional: Positive for fever, activity change and fatigue. Negative for diaphoresis.  HENT: Positive for congestion, postnasal drip, rhinorrhea and sore throat. Negative for ear pain, facial swelling and trouble swallowing.   Eyes: Negative for pain, discharge and redness.  Respiratory: Positive for cough and wheezing. Negative for chest tightness and shortness of breath.   Cardiovascular: Negative.   Gastrointestinal: Positive for diarrhea. Negative for nausea, vomiting, abdominal pain and constipation.  Musculoskeletal: Positive for myalgias and back pain.  Negative for neck pain and neck stiffness.  Skin: Negative.   Neurological: Negative.   All other systems reviewed and are negative.   Allergies  Chocolate  Home Medications   Prior to Admission medications   Medication Sig Start Date End Date Taking? Authorizing Provider  Pseudoeph-Doxylamine-DM-APAP (NYQUIL PO) Take by mouth.   Yes Historical Provider, MD  albuterol (PROVENTIL HFA;VENTOLIN HFA) 108 (90 Base) MCG/ACT inhaler Inhale 2 puffs into the lungs every 4 (four) hours as needed for wheezing or shortness of breath. 04/03/15   Janne Napoleon, NP  aspirin 81 MG tablet Take 81 mg by mouth daily.    Historical Provider, MD  HYDROcodone-acetaminophen (NORCO/VICODIN) 5-325 MG per tablet Take 1 tablet by mouth every 6 (six) hours as needed for moderate pain. 02/02/13   Hope Bunnie Pion, NP  LISINOPRIL PO Take by mouth.    Historical Provider, MD  LYCOPENE PO Take by mouth.    Historical Provider, MD  meclizine (ANTIVERT) 25 MG tablet Take 1 tablet (25 mg total) by mouth 3 (three) times daily as needed. 11/15/12   Veryl Speak, MD  meclizine (ANTIVERT) 25 MG tablet Take 1 tablet (25 mg total) by mouth 3 (three) times daily as needed for dizziness. 06/07/13   Harden Mo, MD  predniSONE (DELTASONE) 20 MG tablet Take 3 tabs po on first day, 2 tabs second day, 2 tabs third day, 1 tab fourth day, 1 tab 5th day. Take with food. 04/03/15   Janne Napoleon, NP   Meds Ordered and Administered this Visit   Medications  ibuprofen (ADVIL,MOTRIN) tablet 800 mg (800 mg Oral Given 04/03/15 1707)  ipratropium-albuterol (DUONEB) 0.5-2.5 (3) MG/3ML nebulizer solution 3 mL (3 mLs Nebulization Given 04/03/15 1743)  albuterol (PROVENTIL) (2.5 MG/3ML) 0.083% nebulizer solution 2.5 mg (2.5 mg Nebulization Given 04/03/15 1743)    BP 103/66 mmHg  Pulse 86  Temp(Src) 102.4 F (39.1 C) (Oral)  Resp 16  SpO2 96% No data found.   Physical Exam  Constitutional: He is oriented to person, place, and time. He appears  well-developed and well-nourished. No distress.  HENT:  Mouth/Throat: No oropharyngeal exudate.  Bilateral TMs normal Oropharynx with minor erythema and clear PND.  Eyes: Conjunctivae and EOM are normal.  Neck: Normal range of motion. Neck supple.  Cardiovascular: Normal rate, regular rhythm and normal heart sounds.   Pulmonary/Chest: Effort normal. No respiratory distress. He has wheezes.  Prolonged expiratory phase Bilateral coarseness with forced expiration.  Musculoskeletal: Normal range of motion. He exhibits no edema.  Lymphadenopathy:    He has no cervical adenopathy.  Neurological: He is alert and oriented to person, place, and time.  Skin: Skin is warm and dry. No rash noted.  Psychiatric: He has a normal mood and affect.  Nursing note and vitals reviewed.   ED Course  Procedures (including critical care time)  Labs Review Labs Reviewed - No data to display  Imaging Review Dg Chest 2 View  04/03/2015  CLINICAL DATA:  Pt here with cough, chest pain, bodyaches, since Saturday night, no asthma, hx of chronic bronchitis, smoker, no surgeries, fever today here was 102.0 EXAM: CHEST  2 VIEW COMPARISON:  09/20/2013 FINDINGS: The heart size and mediastinal contours are within normal limits. Both lungs are clear. No pleural effusion or pneumothorax. The visualized skeletal structures are unremarkable. IMPRESSION: No active cardiopulmonary disease. Electronically Signed   By: Lajean Manes M.D.   On: 04/03/2015 17:42     Visual Acuity Review  Right Eye Distance:   Left Eye Distance:   Bilateral Distance:    Right Eye Near:   Left Eye Near:    Bilateral Near:         MDM   1. Influenza   2. COPD with exacerbation ([place])   3. Bronchospasm    post DuoNeb 2.5/5 mg patient states he is breathing better and feels better. Substantial decrease in wheezing and increase in air movement.  For nasal and head congestion may take Sudafed PE 10 mg every 4 hours as needed. Saline  nasal spray used frequently. For drainage may use Allegra, Claritin or Zyrtec. If you need stronger medicine to stop drainage may take Chlor-Trimeton 2-4 mg every 4 hours. This may cause drowsiness. Ibuprofen 600 mg every 6 hours as needed for pain, discomfort or fever. Drink plenty of fluids and stay well-hydrated. Albuterol HFA inhaler 2 puffs every 4 to 6 hours for cough and wheeze Prednisone taper dose as directed.    Janne Napoleon, NP 04/03/15 1807

## 2015-04-03 NOTE — ED Notes (Signed)
Patient transported to X-ray 

## 2016-01-04 ENCOUNTER — Ambulatory Visit (INDEPENDENT_AMBULATORY_CARE_PROVIDER_SITE_OTHER): Payer: BLUE CROSS/BLUE SHIELD | Admitting: Family

## 2016-01-04 ENCOUNTER — Encounter: Payer: Self-pay | Admitting: Family

## 2016-01-04 ENCOUNTER — Other Ambulatory Visit (INDEPENDENT_AMBULATORY_CARE_PROVIDER_SITE_OTHER): Payer: BLUE CROSS/BLUE SHIELD

## 2016-01-04 VITALS — BP 170/100 | HR 64 | Temp 98.5°F | Resp 16 | Ht 72.0 in | Wt 229.0 lb

## 2016-01-04 DIAGNOSIS — M109 Gout, unspecified: Secondary | ICD-10-CM | POA: Diagnosis not present

## 2016-01-04 DIAGNOSIS — I10 Essential (primary) hypertension: Secondary | ICD-10-CM | POA: Diagnosis not present

## 2016-01-04 DIAGNOSIS — N521 Erectile dysfunction due to diseases classified elsewhere: Secondary | ICD-10-CM | POA: Diagnosis not present

## 2016-01-04 LAB — COMPREHENSIVE METABOLIC PANEL
ALBUMIN: 4.4 g/dL (ref 3.5–5.2)
ALK PHOS: 73 U/L (ref 39–117)
ALT: 24 U/L (ref 0–53)
AST: 22 U/L (ref 0–37)
BILIRUBIN TOTAL: 0.6 mg/dL (ref 0.2–1.2)
BUN: 16 mg/dL (ref 6–23)
CO2: 28 mEq/L (ref 19–32)
Calcium: 10.4 mg/dL (ref 8.4–10.5)
Chloride: 104 mEq/L (ref 96–112)
Creatinine, Ser: 1.15 mg/dL (ref 0.40–1.50)
GFR: 83.44 mL/min (ref >60.00)
Glucose, Bld: 115 mg/dL — ABNORMAL HIGH (ref 70–99)
POTASSIUM: 4.6 meq/L (ref 3.5–5.1)
Sodium: 139 mEq/L (ref 135–145)
Total Protein: 8.1 g/dL (ref 6.0–8.3)

## 2016-01-04 LAB — HEMOGLOBIN A1C: HEMOGLOBIN A1C: 6.5 % (ref 4.6–6.5)

## 2016-01-04 LAB — URIC ACID: URIC ACID, SERUM: 7.3 mg/dL (ref 4.0–7.8)

## 2016-01-04 MED ORDER — SILDENAFIL CITRATE 20 MG PO TABS: 20.0000 mg | ORAL_TABLET | Freq: Three times a day (TID) | ORAL | 0 refills | Status: DC

## 2016-01-04 MED ORDER — SILDENAFIL CITRATE 20 MG PO TABS: 20.0000 mg | ORAL_TABLET | Freq: Every day | ORAL | 0 refills | Status: DC | PRN

## 2016-01-04 MED ORDER — AMLODIPINE BESYLATE 5 MG PO TABS: 5.0000 mg | ORAL_TABLET | Freq: Every day | ORAL | 3 refills | Status: DC

## 2016-01-04 NOTE — Assessment & Plan Note (Signed)
Blood pressure remains significantly elevated above goal 140/90 with current regimen. Continue current dosage of lisinopril. Start amlodipine. Encouraged to monitor blood pressures at home and follow low-sodium diet. Denies worse headache of life and no new symptoms of end organ damage noted upon exam today. Continue to monitor.

## 2016-01-04 NOTE — Assessment & Plan Note (Signed)
Continues to experience erectile dysfunction with difficulty maintaining an erection and softened erections that have been refractory to Cialis. This is most likely multifactorial including hypertension and hypertensive medications. Start sildenafil. If symptoms worsen or do not improve consider referral to urology for further assessment and treatment.

## 2016-01-04 NOTE — Assessment & Plan Note (Signed)
Questionable gout with occasional flares. Obtain uric acid. Considered daily medications including allopurinol if symptoms do not improve or uric acid levels remain high.

## 2016-01-04 NOTE — Progress Notes (Signed)
Subjective:    Patient ID: Gabriel Coleman, male    DOB: May 18, 1955, 60 y.o.   MRN: [id number]  Chief Complaint  Patient presents with  . Establish Care    diabetes check, personal issues    HPI:  Gabriel Coleman is a 60 y.o. male who  has a past medical history of COPD (chronic obstructive pulmonary disease) ([place]); Hepatitis B; Hepatitis C; and Hypertension. and presents today for an office visit to establish care.   1.) Hypertension - Currently maintained on lisinopril and reports taking the medications as prescribed and denies adverse side effects. Does not check his blood pressure at home on a regular basis. Denies any changes in vision, worst headache of life or symptoms of end organ damage.  2.) Erectile dysfunction - This is a new problem. Associated symptom of erectile dysfunction has been going on for a couple of years. Able to to obtain but not maintain an erection. Modifying factors include Cialis which did not help. Has not tried sildenafil.  3.) Gout - Continues to experience the associated symptom of gout flares that occur in his left great toe which he reports is frequently. May also occur in his left wrist and right knee. Indicates he is taking magnesium because he read that magnesium is good for gout.   Allergies  Allergen Reactions  . Chocolate Rash      Outpatient Medications Prior to Visit  Medication Sig Dispense Refill  . albuterol (PROVENTIL HFA;VENTOLIN HFA) 108 (90 Base) MCG/ACT inhaler Inhale 2 puffs into the lungs every 4 (four) hours as needed for wheezing or shortness of breath. 1 Inhaler 0  . aspirin 81 MG tablet Take 81 mg by mouth daily.    Marland Kitchen HYDROcodone-acetaminophen (NORCO/VICODIN) 5-325 MG per tablet Take 1 tablet by mouth every 6 (six) hours as needed for moderate pain. 20 tablet 0  . LISINOPRIL PO Take by mouth.    Marland Kitchen LYCOPENE PO Take by mouth.    . meclizine (ANTIVERT) 25 MG tablet Take 1 tablet (25 mg total) by mouth 3 (three) times daily as  needed. 15 tablet 0  . meclizine (ANTIVERT) 25 MG tablet Take 1 tablet (25 mg total) by mouth 3 (three) times daily as needed for dizziness. 30 tablet 0  . predniSONE (DELTASONE) 20 MG tablet Take 3 tabs po on first day, 2 tabs second day, 2 tabs third day, 1 tab fourth day, 1 tab 5th day. Take with food. 9 tablet 0  . Pseudoeph-Doxylamine-DM-APAP (NYQUIL PO) Take by mouth.     No facility-administered medications prior to visit.      Past Medical History:  Diagnosis Date  . COPD (chronic obstructive pulmonary disease) ([place])   . Hepatitis B   . Hepatitis C   . Hypertension     History reviewed. No pertinent surgical history.   Family History  Problem Relation Age of Onset  . Diabetes Mother   . Heart disease Mother   . Alcohol abuse Father   . Esophageal cancer Father     Social History   Social History  . Marital status: Married    Spouse name: N/A  . Number of children: 0  . Years of education: 58   Occupational History  . Finisher    Social History Main Topics  . Smoking status: Former Smoker    Packs/day: 1.00    Years: 43.00    Types: Cigarettes  . Smokeless tobacco: Never Used  . Alcohol use Yes  Comment: occasional   . Drug use: No  . Sexual activity: Yes   Other Topics Concern  . Not on file   Social History Narrative  . No narrative on file      Review of Systems  Constitutional: Negative for chills and fever.  Eyes:       Negative for changes in vision  Respiratory: Negative for cough, chest tightness and wheezing.   Cardiovascular: Negative for chest pain, palpitations and leg swelling.  Musculoskeletal: Negative for arthralgias.  Neurological: Negative for dizziness, weakness and light-headedness.       Objective:    BP (!) 170/100 (BP Location: Left Arm, Patient Position: Sitting, Cuff Size: Large)   Pulse 64   Temp 98.5 F (36.9 C) (Oral)   Resp 16   Ht 6' (1.829 m)   Wt 229 lb (103.9 kg)   SpO2 93%   BMI 31.06 kg/m    Nursing note and vital signs reviewed.  Physical Exam  Constitutional: He is oriented to person, place, and time. He appears well-developed and well-nourished. No distress.  Cardiovascular: Normal rate, regular rhythm, normal heart sounds and intact distal pulses.   Pulmonary/Chest: Effort normal and breath sounds normal.  Neurological: He is alert and oriented to person, place, and time.  Skin: Skin is warm and dry.  Psychiatric: He has a normal mood and affect. His behavior is normal. Judgment and thought content normal.       Assessment & Plan:   Problem List Items Addressed This Visit      Cardiovascular and Mediastinum   Essential hypertension    Blood pressure remains significantly elevated above goal 140/90 with current regimen. Continue current dosage of lisinopril. Start amlodipine. Encouraged to monitor blood pressures at home and follow low-sodium diet. Denies worse headache of life and no new symptoms of end organ damage noted upon exam today. Continue to monitor.      Relevant Medications   lisinopril (PRINIVIL,ZESTRIL) 10 MG tablet   amLODipine (NORVASC) 5 MG tablet   sildenafil (REVATIO) 20 MG tablet   Other Relevant Orders   Comprehensive metabolic panel (Completed)   Hemoglobin A1c (Completed)     Musculoskeletal and Integument   Gout involving toe of left foot - Primary    Questionable gout with occasional flares. Obtain uric acid. Considered daily medications including allopurinol if symptoms do not improve or uric acid levels remain high.      Relevant Orders   Uric acid (Completed)     Other   Erectile dysfunction due to diseases classified elsewhere    Continues to experience erectile dysfunction with difficulty maintaining an erection and softened erections that have been refractory to Cialis. This is most likely multifactorial including hypertension and hypertensive medications. Start sildenafil. If symptoms worsen or do not improve consider referral  to urology for further assessment and treatment.      Relevant Medications   sildenafil (REVATIO) 20 MG tablet       I have discontinued Mr. Mcglothlin's meclizine, LYCOPENE PO, HYDROcodone-acetaminophen, LISINOPRIL PO, meclizine, Pseudoeph-Doxylamine-DM-APAP (NYQUIL PO), predniSONE, and dextromethorphan-guaiFENesin. I have also changed his sildenafil. Additionally, I am having him start on amLODipine. Lastly, I am having him maintain his aspirin, albuterol, lisinopril, triamcinolone, Calcium Carbonate Antacid (TUMS CHEWY BITES PO), and Magnesium.   Meds ordered this encounter  Medications  . lisinopril (PRINIVIL,ZESTRIL) 10 MG tablet    Sig: Take 10 mg by mouth daily. Take 1/2 a tablet daily  . DISCONTD: dextromethorphan-guaiFENesin (MUCINEX DM) 30-600 MG  12hr tablet    Sig: Take 1 tablet by mouth daily.  Marland Kitchen triamcinolone (KENALOG) 0.1 % paste    Sig: Use as directed 1 application in the mouth or throat 3 (three) times daily.  . Calcium Carbonate Antacid (TUMS CHEWY BITES PO)    Sig: Take by mouth.  . Magnesium 250 MG TABS    Sig: Take by mouth.  Marland Kitchen amLODipine (NORVASC) 5 MG tablet    Sig: Take 1 tablet (5 mg total) by mouth daily.    Dispense:  90 tablet    Refill:  3    Order Specific Question:   Supervising Provider    Answer:   Pricilla Holm A [id number]  . DISCONTD: sildenafil (REVATIO) 20 MG tablet    Sig: Take 1 tablet (20 mg total) by mouth 3 (three) times daily.    Dispense:  10 tablet    Refill:  0    Order Specific Question:   Supervising Provider    Answer:   Pricilla Holm A [id number]  . sildenafil (REVATIO) 20 MG tablet    Sig: Take 1-5 tablets (20-100 mg total) by mouth daily as needed.    Dispense:  50 tablet    Refill:  0    Order Specific Question:   Supervising Provider    Answer:   Pricilla Holm A [id number]     Follow-up: Return in about 1 month (around 02/03/2016), or if symptoms worsen or fail to improve.  Mauricio Po, FNP

## 2016-01-04 NOTE — Patient Instructions (Signed)
Thank you for choosing [facility].  SUMMARY AND INSTRUCTIONS:  Please continue to take the lisinopril and start taking the amlodipine daily.  Monitor you blood pressure at home.  Start the sildenafil and you will receive a call from [facility].  Follow a low sodium diet.  Medication:  Your prescription(s) have been submitted to your pharmacy or been printed and provided for you. Please take as directed and contact our office if you believe you are having problem(s) with the medication(s) or have any questions.  Labs:  Please stop by the lab on the lower level of the building for your blood work. Your results will be released to [place] (or called to you) after review, usually within 72 hours after test completion. If any changes need to be made, you will be notified at that same time.  1.) The lab is open from 7:30am to 5:30 pm Monday-Friday 2.) No appointment is necessary 3.) Fasting (if needed) is 6-8 hours after food and drink; black coffee and water are okay   Follow up:  If your symptoms worsen or fail to improve, please contact our office for further instruction, or in case of emergency go directly to the emergency room at the closest medical facility.   DASH Eating Plan DASH stands for "Dietary Approaches to Stop Hypertension." The DASH eating plan is a healthy eating plan that has been shown to reduce high blood pressure (hypertension). Additional health benefits may include reducing the risk of type 2 diabetes mellitus, heart disease, and stroke. The DASH eating plan may also help with weight loss. What do I need to know about the DASH eating plan? For the DASH eating plan, you will follow these general guidelines:  Choose foods with less than 150 milligrams of sodium per serving (as listed on the food label).  Use salt-free seasonings or herbs instead of table salt or sea salt.  Check with your health care provider or pharmacist before using salt  substitutes.  Eat lower-sodium products. These are often labeled as "low-sodium" or "no salt added."  Eat fresh foods. Avoid eating a lot of canned foods.  Eat more vegetables, fruits, and low-fat dairy products.  Choose whole grains. Look for the word "whole" as the first word in the ingredient list.  Choose fish and skinless chicken or [place] more often than red meat. Limit fish, poultry, and meat to 6 oz (170 g) each day.  Limit sweets, desserts, sugars, and sugary drinks.  Choose heart-healthy fats.  Eat more home-cooked food and less restaurant, buffet, and fast food.  Limit fried foods.  Do not fry foods. Cook foods using methods such as baking, boiling, grilling, and broiling instead.  When eating at a restaurant, ask that your food be prepared with less salt, or no salt if possible. What foods can I eat? Seek help from a dietitian for individual calorie needs. Grains  Whole grain or whole wheat bread. Brown rice. Whole grain or whole wheat pasta. Quinoa, bulgur, and whole grain cereals. Low-sodium cereals. Corn or whole wheat flour tortillas. Whole grain cornbread. Whole grain crackers. Low-sodium crackers. Vegetables  Fresh or frozen vegetables (raw, steamed, roasted, or grilled). Low-sodium or reduced-sodium tomato and vegetable juices. Low-sodium or reduced-sodium tomato sauce and paste. Low-sodium or reduced-sodium canned vegetables. Fruits  All fresh, canned (in natural juice), or frozen fruits. Meat and Other Protein Products  Ground beef (85% or leaner), grass-fed beef, or beef trimmed of fat. Skinless chicken or [place]. Ground chicken or [place]. Pork  trimmed of fat. All fish and seafood. Eggs. Dried beans, peas, or lentils. Unsalted nuts and seeds. Unsalted canned beans. Dairy  Low-fat dairy products, such as skim or 1% milk, 2% or reduced-fat cheeses, low-fat ricotta or cottage cheese, or plain low-fat yogurt. Low-sodium or reduced-sodium cheeses. Fats and Oils   Tub margarines without trans fats. Light or reduced-fat mayonnaise and salad dressings (reduced sodium). Avocado. Safflower, olive, or canola oils. Natural peanut or almond butter. Other  Unsalted popcorn and pretzels. The items listed above may not be a complete list of recommended foods or beverages. Contact your dietitian for more options.  What foods are not recommended? Grains  White bread. White pasta. White rice. Refined cornbread. Bagels and croissants. Crackers that contain trans fat. Vegetables  Creamed or fried vegetables. Vegetables in a cheese sauce. Regular canned vegetables. Regular canned tomato sauce and paste. Regular tomato and vegetable juices. Fruits  Canned fruit in light or heavy syrup. Fruit juice. Meat and Other Protein Products  Fatty cuts of meat. Ribs, chicken wings, bacon, sausage, bologna, salami, chitterlings, fatback, hot dogs, bratwurst, and packaged luncheon meats. Salted nuts and seeds. Canned beans with salt. Dairy  Whole or 2% milk, cream, half-and-half, and cream cheese. Whole-fat or sweetened yogurt. Full-fat cheeses or blue cheese. Nondairy creamers and whipped toppings. Processed cheese, cheese spreads, or cheese curds. Condiments  Onion and garlic salt, seasoned salt, table salt, and sea salt. Canned and packaged gravies. Worcestershire sauce. Tartar sauce. Barbecue sauce. Teriyaki sauce. Soy sauce, including reduced sodium. Steak sauce. Fish sauce. Oyster sauce. Cocktail sauce. Horseradish. Ketchup and mustard. Meat flavorings and tenderizers. Bouillon cubes. Hot sauce. Tabasco sauce. Marinades. Taco seasonings. Relishes. Fats and Oils  Butter, stick margarine, lard, shortening, ghee, and bacon fat. Coconut, palm kernel, or palm oils. Regular salad dressings. Other  Pickles and olives. Salted popcorn and pretzels. The items listed above may not be a complete list of foods and beverages to avoid. Contact your dietitian for more information.  Where can  I find more information? National Heart, Lung, and Blood Institute: travelstabloid.com This information is not intended to replace advice given to you by your health care provider. Make sure you discuss any questions you have with your health care provider. Document Released: 01/10/2011 Document Revised: 06/29/2015 Document Reviewed: 11/25/2012   Gout Gout is painful swelling that can occur in some of your joints. Gout is a type of arthritis. This condition is caused by having too much uric acid in your body. Uric acid is a chemical that forms when your body breaks down substances called purines. Purines are important for building body proteins. When your body has too much uric acid, sharp crystals can form and build up inside your joints. This causes pain and swelling. Gout attacks can happen quickly and be very painful (acute gout). Over time, the attacks can affect more joints and become more frequent (chronic gout). Gout can also cause uric acid to build up under your skin and inside your kidneys. What are the causes? This condition is caused by too much uric acid in your blood. This can occur because:  Your kidneys do not remove enough uric acid from your blood. This is the most common cause.  Your body makes too much uric acid. This can occur with some cancers and cancer treatments. It can also occur if your body is breaking down too many red blood cells (hemolytic anemia).  You eat too many foods that are high in purines. These foods include organ  meats and some seafood. Alcohol, especially beer, is also high in purines. A gout attack may be triggered by trauma or stress. What increases the risk? This condition is more likely to develop in people who:  Have a family history of gout.  Are male and middle-aged.  Are male and have gone through menopause.  Are obese.  Frequently drink alcohol, especially beer.  Are dehydrated.  Lose weight too  quickly.  Have an organ transplant.  Have lead poisoning.  Take certain medicines, including aspirin, cyclosporine, diuretics, levodopa, and niacin.  Have kidney disease or psoriasis. What are the signs or symptoms? An attack of acute gout happens quickly. It usually occurs in just one joint. The most common place is the big toe. Attacks often start at night. Other joints that may be affected include joints of the feet, ankle, knee, fingers, wrist, or elbow. Symptoms may include:  Severe pain.  Warmth.  Swelling.  Stiffness.  Tenderness. The affected joint may be very painful to touch.  Shiny, red, or purple skin.  Chills and fever. Chronic gout may cause symptoms more frequently. More joints may be involved. You may also have white or yellow lumps (tophi) on your hands or feet or in other areas near your joints. How is this diagnosed? This condition is diagnosed based on your symptoms, medical history, and physical exam. You may have tests, such as:  Blood tests to measure uric acid levels.  Removal of joint fluid with a needle (aspiration) to look for uric acid crystals.  X-rays to look for joint damage. How is this treated? Treatment for this condition has two phases: treating an acute attack and preventing future attacks. Acute gout treatment may include medicines to reduce pain and swelling, including:  NSAIDs.  Steroids. These are strong anti-inflammatory medicines that can be taken by mouth (orally) or injected into a joint.  Colchicine. This medicine relieves pain and swelling when it is taken soon after an attack. It can be given orally or through an IV tube. Preventive treatment may include:  Daily use of smaller doses of NSAIDs or colchicine.  Use of a medicine that reduces uric acid levels in your blood.  Changes to your diet. You may need to see a specialist about healthy eating (dietitian). Follow these instructions at home: During a Gout Attack  If  directed, apply ice to the affected area:  Put ice in a plastic bag.  Place a towel between your skin and the bag.  Leave the ice on for 20 minutes, 2-3 times a day.  Rest the joint as much as possible. If the affected joint is in your leg, you may be given crutches to use.  Raise (elevate) the affected joint above the level of your heart as often as possible.  Drink enough fluids to keep your urine clear or pale yellow.  Take over-the-counter and prescription medicines only as told by your health care provider.  Do not drive or operate heavy machinery while taking prescription pain medicine.  Follow instructions from your health care provider about eating or drinking restrictions.  Return to your normal activities as told by your health care provider. Ask your health care provider what activities are safe for you. Avoiding Future Gout Attacks  Follow a low-purine diet as told by your dietitian or health care provider. Avoid foods and drinks that are high in purines, including liver, kidney, anchovies, asparagus, herring, mushrooms, mussels, and beer.  Limit alcohol intake to no more than 1 drink  a day for nonpregnant women and 2 drinks a day for men. One drink equals 12 oz of beer, 5 oz of wine, or 1 oz of hard liquor.  Maintain a healthy weight or lose weight if you are overweight. If you want to lose weight, talk with your health care provider. It is important that you do not lose weight too quickly.  Start or maintain an exercise program as told by your health care provider.  Drink enough fluids to keep your urine clear or pale yellow.  Take over-the-counter and prescription medicines only as told by your health care provider.  Keep all follow-up visits as told by your health care provider. This is important. Contact a health care provider if:  You have another gout attack.  You continue to have symptoms of a gout attack after10 days of treatment.  You have side effects  from your medicines.  You have chills or a fever.  You have burning pain when you urinate.  You have pain in your lower back or belly. Get help right away if:  You have severe or uncontrolled pain.  You cannot urinate. This information is not intended to replace advice given to you by your health care provider. Make sure you discuss any questions you have with your health care provider. Document Released: 01/19/2000 Document Revised: 06/29/2015 Document Reviewed: 11/03/2014 Elsevier Interactive Patient Education  2017 [employer].  Chartered certified accountant Patient Education  [employer].

## 2016-01-16 ENCOUNTER — Telehealth: Payer: Self-pay | Admitting: General Practice

## 2016-01-16 NOTE — Telephone Encounter (Signed)
Pt called in and said that the med that was given to him is not working and would like someone to give him a call

## 2016-01-16 NOTE — Telephone Encounter (Signed)
Returned pts call. Was not there. Will try back later.

## 2016-02-19 ENCOUNTER — Telehealth: Payer: Self-pay | Admitting: Emergency Medicine

## 2016-02-19 DIAGNOSIS — N521 Erectile dysfunction due to diseases classified elsewhere: Secondary | ICD-10-CM

## 2016-02-19 NOTE — Telephone Encounter (Signed)
Pt stated the prescription for Gabriel Coleman is not working and wants to know what to try next. He is also asking for a referral to Urology. Pt states [name] knows why he needs this referral. Please advise thanks.

## 2016-02-20 NOTE — Telephone Encounter (Signed)
Referral sent 

## 2016-03-18 ENCOUNTER — Ambulatory Visit (INDEPENDENT_AMBULATORY_CARE_PROVIDER_SITE_OTHER): Payer: BLUE CROSS/BLUE SHIELD | Admitting: Family

## 2016-03-18 ENCOUNTER — Encounter: Payer: Self-pay | Admitting: Family

## 2016-03-18 VITALS — BP 124/80 | HR 81 | Temp 98.9°F | Resp 16 | Ht 72.0 in | Wt 233.0 lb

## 2016-03-18 DIAGNOSIS — H00011 Hordeolum externum right upper eyelid: Secondary | ICD-10-CM | POA: Diagnosis not present

## 2016-03-18 DIAGNOSIS — R200 Anesthesia of skin: Secondary | ICD-10-CM | POA: Insufficient documentation

## 2016-03-18 DIAGNOSIS — H00019 Hordeolum externum unspecified eye, unspecified eyelid: Secondary | ICD-10-CM | POA: Insufficient documentation

## 2016-03-18 MED ORDER — GABAPENTIN 300 MG PO CAPS: 300.0000 mg | ORAL_CAPSULE | Freq: Every day | ORAL | 0 refills | Status: DC
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
## 2016-03-18 NOTE — Assessment & Plan Note (Signed)
Symptoms and exam consistent with hordeolum of the right upper eyelid. Appears healing with no evidence of inflammation or cellulitis. Continue warm compresses and wash with soap. Follow-up if symptoms worsen or do not improve.
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
## 2016-03-18 NOTE — Patient Instructions (Addendum)
Thank you for choosing [facility].  SUMMARY AND INSTRUCTIONS:  Consider washing your eyes with a [place] baby soap that is tear free.   Medication:  Your prescription(s) have been submitted to your pharmacy or been printed and provided for you. Please take as directed and contact our office if you believe you are having problem(s) with the medication(s) or have any questions.  Follow up:  If your symptoms worsen or fail to improve, please contact our office for further instruction, or in case of emergency go directly to the emergency room at the closest medical facility.   Stye A stye is a bump on your eyelid caused by a bacterial infection. A stye can form inside the eyelid (internal stye) or outside the eyelid (external stye). An internal stye may be caused by an infected oil-producing gland inside your eyelid. An external stye may be caused by an infection at the base of your eyelash (hair follicle). Styes are very common. Anyone can get them at any age. They usually occur in just one eye, but you may have more than one in either eye. What are the causes? The infection is almost always caused by bacteria called Staphylococcus aureus. This is a common type of bacteria that lives on your skin. What increases the risk? You may be at higher risk for a stye if you have had one before. You may also be at higher risk if you have:  Diabetes.  Long-term illness.  Long-term eye redness.  A skin condition called seborrhea.  High fat levels in your blood (lipids). What are the signs or symptoms? Eyelid pain is the most common symptom of a stye. Internal styes are more painful than external styes. Other signs and symptoms may include:  Painful swelling of your eyelid.  A scratchy feeling in your eye.  Tearing and redness of your eye.  Pus draining from the stye. How is this diagnosed? Your health care provider may be able to diagnose a stye just by examining your  eye. The health care provider may also check to make sure:  You do not have a fever or other signs of a more serious infection.  The infection has not spread to other parts of your eye or areas around your eye. How is this treated? Most styes will clear up in a few days without treatment. In some cases, you may need to use antibiotic drops or ointment to prevent infection. Your health care provider may have to drain the stye surgically if your stye is:  Large.  Causing a lot of pain.  Interfering with your vision. This can be done using a thin blade or a needle. Follow these instructions at home:  Take medicines only as directed by your health care provider.  Apply a clean, warm compress to your eye for 10 minutes, 4 times a day.  Do not wear contact lenses or eye makeup until your stye has healed.  Do not try to pop or drain the stye. Contact a health care provider if:  You have chills or a fever.  Your stye does not go away after several days.  Your stye affects your vision.  Your eyeball becomes swollen, red, or painful. This information is not intended to replace advice given to you by your health care provider. Make sure you discuss any questions you have with your health care provider. Document Released: 10/31/2004 Document Revised: 09/17/2015 Document Reviewed: 05/07/2013 Elsevier Interactive Patient Education  2017 [employer].

## 2016-04-02 DIAGNOSIS — R350 Frequency of micturition: Secondary | ICD-10-CM | POA: Diagnosis not present

## 2016-04-02 DIAGNOSIS — N5201 Erectile dysfunction due to arterial insufficiency: Secondary | ICD-10-CM | POA: Diagnosis not present

## 2016-04-02 DIAGNOSIS — N401 Enlarged prostate with lower urinary tract symptoms: Secondary | ICD-10-CM | POA: Diagnosis not present

## 2016-04-14 ENCOUNTER — Other Ambulatory Visit: Payer: Self-pay | Admitting: Family

## 2016-04-14 DIAGNOSIS — R200 Anesthesia of skin: Secondary | ICD-10-CM

## 2016-05-01 DIAGNOSIS — N5201 Erectile dysfunction due to arterial insufficiency: Secondary | ICD-10-CM | POA: Diagnosis not present

## 2016-05-12 ENCOUNTER — Other Ambulatory Visit: Payer: Self-pay | Admitting: Family

## 2016-05-12 DIAGNOSIS — R200 Anesthesia of skin: Secondary | ICD-10-CM

## 2016-05-20 ENCOUNTER — Other Ambulatory Visit: Payer: Self-pay | Admitting: Family

## 2016-06-07 DIAGNOSIS — N401 Enlarged prostate with lower urinary tract symptoms: Secondary | ICD-10-CM | POA: Diagnosis not present

## 2016-06-07 DIAGNOSIS — R350 Frequency of micturition: Secondary | ICD-10-CM | POA: Diagnosis not present

## 2016-06-07 DIAGNOSIS — N5201 Erectile dysfunction due to arterial insufficiency: Secondary | ICD-10-CM | POA: Diagnosis not present

## 2016-07-11 ENCOUNTER — Other Ambulatory Visit: Payer: Self-pay | Admitting: Family

## 2016-07-16 DIAGNOSIS — N5201 Erectile dysfunction due to arterial insufficiency: Secondary | ICD-10-CM | POA: Diagnosis not present

## 2016-10-03 ENCOUNTER — Emergency Department (HOSPITAL_COMMUNITY): Payer: BLUE CROSS/BLUE SHIELD

## 2016-10-03 ENCOUNTER — Encounter (HOSPITAL_COMMUNITY): Payer: Self-pay

## 2016-10-03 ENCOUNTER — Emergency Department (HOSPITAL_COMMUNITY)
Admission: EM | Admit: 2016-10-03 | Discharge: 2016-10-03 | Disposition: A | Discharge status: Home / Self Care | Payer: BLUE CROSS/BLUE SHIELD | Attending: Emergency Medicine | Admitting: Emergency Medicine

## 2016-10-03 DIAGNOSIS — Z7982 Long term (current) use of aspirin: Secondary | ICD-10-CM | POA: Diagnosis not present

## 2016-10-03 DIAGNOSIS — J449 Chronic obstructive pulmonary disease, unspecified: Secondary | ICD-10-CM | POA: Diagnosis not present

## 2016-10-03 DIAGNOSIS — M542 Cervicalgia: Secondary | ICD-10-CM | POA: Diagnosis not present

## 2016-10-03 DIAGNOSIS — Z87891 Personal history of nicotine dependence: Secondary | ICD-10-CM | POA: Insufficient documentation

## 2016-10-03 DIAGNOSIS — E049 Nontoxic goiter, unspecified: Secondary | ICD-10-CM | POA: Diagnosis not present

## 2016-10-03 DIAGNOSIS — I10 Essential (primary) hypertension: Secondary | ICD-10-CM | POA: Insufficient documentation

## 2016-10-03 DIAGNOSIS — R070 Pain in throat: Secondary | ICD-10-CM | POA: Diagnosis not present

## 2016-10-03 DIAGNOSIS — Z79899 Other long term (current) drug therapy: Secondary | ICD-10-CM | POA: Insufficient documentation

## 2016-10-03 LAB — BASIC METABOLIC PANEL
Anion gap: 9 (ref 5–15)
BUN: 11 mg/dL (ref 6–20)
CO2: 24 mmol/L (ref 22–32)
Calcium: 10 mg/dL (ref 8.9–10.3)
Chloride: 105 mmol/L (ref 101–111)
Creatinine, Ser: 1.1 mg/dL (ref 0.61–1.24)
GFR calc Af Amer: >60 mL/min (ref >60)
GFR calc non Af Amer: >60 mL/min (ref >60)
Glucose, Bld: 92 mg/dL (ref 65–99)
Potassium: 4.5 mmol/L (ref 3.5–5.1)
Sodium: 138 mmol/L (ref 135–145)

## 2016-10-03 LAB — CBC WITH DIFFERENTIAL/PLATELET
Basophils Absolute: 0 10*3/uL (ref 0.0–0.1)
Basophils Relative: 0 %
Eosinophils Absolute: 0.2 10*3/uL (ref 0.0–0.7)
Eosinophils Relative: 3 %
HCT: 46.1 % (ref 39.0–52.0)
Hemoglobin: 16 g/dL (ref 13.0–17.0)
Lymphocytes Relative: 45 %
Lymphs Abs: 3.1 10*3/uL (ref 0.7–4.0)
MCH: 30.4 pg (ref 26.0–34.0)
MCHC: 34.7 g/dL (ref 30.0–36.0)
MCV: 87.6 fL (ref 78.0–100.0)
Monocytes Absolute: 0.7 10*3/uL (ref 0.1–1.0)
Monocytes Relative: 10 %
Neutro Abs: 2.9 10*3/uL (ref 1.7–7.7)
Neutrophils Relative %: 42 %
Platelets: 196 10*3/uL (ref 150–400)
RBC: 5.26 MIL/uL (ref 4.22–5.81)
RDW: 12.7 % (ref 11.5–15.5)
WBC: 6.9 10*3/uL (ref 4.0–10.5)

## 2016-10-03 MED ORDER — MORPHINE SULFATE (PF) 4 MG/ML IV SOLN
6.0000 mg | Freq: Once | INTRAVENOUS | Status: AC
  Administered 2016-10-03: 6 mg via INTRAVENOUS
  Filled 2016-10-03: qty 2

## 2016-10-03 MED ORDER — OXYCODONE-ACETAMINOPHEN 5-325 MG PO TABS: 1.0000 | ORAL_TABLET | ORAL | 0 refills | Status: DC | PRN

## 2016-10-03 MED ORDER — IOPAMIDOL (ISOVUE-370) INJECTION 76%
INTRAVENOUS | Status: AC
  Administered 2016-10-03: 50 mL via INTRAVENOUS
  Filled 2016-10-03: qty 50

## 2016-10-03 MED ORDER — SODIUM CHLORIDE 0.9 % IV SOLN
INTRAVENOUS | Status: DC
  Administered 2016-10-03: 12:00:00 via INTRAVENOUS

## 2016-10-03 MED ORDER — IBUPROFEN 400 MG PO TABS: 400.0000 mg | ORAL_TABLET | Freq: Four times a day (QID) | ORAL | 0 refills | Status: DC | PRN

## 2016-10-03 MED ORDER — KETOROLAC TROMETHAMINE 15 MG/ML IJ SOLN
15.0000 mg | Freq: Once | INTRAMUSCULAR | Status: AC
  Administered 2016-10-03: 15 mg via INTRAVENOUS
  Filled 2016-10-03: qty 1

## 2016-10-03 MED ORDER — MELOXICAM 15 MG PO TABS
15.0000 mg | ORAL_TABLET | Freq: Every day | ORAL | 0 refills | Status: DC | PRN
Start: 2016-10-03 — End: 2017-10-02

## 2016-10-03 NOTE — ED Notes (Signed)
Pt approved pt's discharge with oxygen saturation

## 2016-10-03 NOTE — ED Notes (Signed)
Pt taken off of oxygen to see how saturation stays while on RA.

## 2016-10-03 NOTE — ED Provider Notes (Signed)
[facility] DEPT Provider Note   CSN: [id number] Arrival date & time: 10/03/16  0929     History   Chief Complaint Chief Complaint  Patient presents with  . Sore Throat    HPI Gabriel Coleman is a 61 y.o. male.  HPI   [name] with acute onset of severe neck/throat pain. Constant since onset. Happened while turning his head to check blind spot and swallowing at the same time. Severe pain since then. Worse with some movements. No difficulty swallowing. No dyspnea, drooling or change in voice. No visual changes, tinnitus or other neuro complaints.    Past Medical History:  Diagnosis Date  . COPD (chronic obstructive pulmonary disease) ([place])   . Hepatitis B   . Hepatitis C   . Hypertension     Patient Active Problem List   Diagnosis Date Noted  . Hordeolum 03/18/2016  . Numbness of anterior thigh 03/18/2016  . Essential hypertension 01/04/2016  . Gout involving toe of left foot 01/04/2016  . Erectile dysfunction due to diseases classified elsewhere 01/04/2016    History reviewed. No pertinent surgical history.     Home Medications    Prior to Admission medications   Medication Sig Start Date End Date Taking? Authorizing Provider  albuterol (PROVENTIL HFA;VENTOLIN HFA) 108 (90 Base) MCG/ACT inhaler Inhale 2 puffs into the lungs every 4 (four) hours as needed for wheezing or shortness of breath. 04/03/15   Janne Napoleon, NP  amLODipine (NORVASC) 5 MG tablet Take 1 tablet (5 mg total) by mouth daily. 01/04/16   Golden Circle, FNP  aspirin 81 MG tablet Take 81 mg by mouth daily.    [provider]  Calcium Carbonate Antacid (TUMS CHEWY BITES PO) Take by mouth.    [provider]  gabapentin (NEURONTIN) 300 MG capsule TAKE 1 CAPSULE(300 MG) BY MOUTH AT BEDTIME 05/13/16   Golden Circle, FNP  lisinopril (PRINIVIL,ZESTRIL) 10 MG tablet Take 0.5 tablets (5 mg total) by mouth daily. Overdue for follow-up APPT MUST SEE MD FOR REFILLS 07/11/16   Golden Circle, FNP  Magnesium 250 MG TABS Take by mouth.    [provider]  sildenafil (REVATIO) 20 MG tablet Take 1-5 tablets (20-100 mg total) by mouth daily as needed. 01/04/16   Golden Circle, FNP    Family History Family History  Problem Relation Age of Onset  . Diabetes Mother   . Heart disease Mother   . Alcohol abuse Father   . Esophageal cancer Father     Social History Social History  Substance Use Topics  . Smoking status: Former Smoker    Packs/day: 1.00    Years: 43.00    Types: Cigarettes  . Smokeless tobacco: Never Used  . Alcohol use Yes     Comment: occasional      Allergies   Chocolate   Review of Systems Review of Systems  All systems reviewed and negative, other than as noted in HPI.  Physical Exam Updated Vital Signs BP (!) 144/90   Pulse 67   Temp 98.7 F (37.1 C) (Oral)   Resp 19   SpO2 91%   Physical Exam  Constitutional: He appears well-developed and well-nourished. No distress.  HENT:  Head: Normocephalic and atraumatic.  Mild TTP along L lateral neck and anteriorly. No nodes. Supple. Normal in appearance externally. Oropharynx clear. Normal sounds voice. Handling secretions. No stridor.   Eyes: Conjunctivae are normal. Right eye exhibits no discharge. Left eye exhibits no discharge.  Neck: Neck supple.  Cardiovascular: Normal rate, regular rhythm and normal heart sounds.  Exam reveals no gallop and no friction rub.   No murmur heard. Pulmonary/Chest: Effort normal and breath sounds normal. No respiratory distress.  Abdominal: Soft. He exhibits no distension. There is no tenderness.  Musculoskeletal: He exhibits no edema or tenderness.  Neurological: He is alert.  Skin: Skin is warm and dry.  Psychiatric: He has a normal mood and affect. His behavior is normal. Thought content normal.  Nursing note and vitals reviewed.    ED Treatments / Results  Labs (all labs ordered are listed, but only abnormal results are  displayed) Labs Reviewed  CBC WITH DIFFERENTIAL/PLATELET  BASIC METABOLIC PANEL    EKG  EKG Interpretation None       Radiology No results found.  Procedures Procedures (including critical care time)  Medications Ordered in ED Medications  0.9 %  sodium chloride infusion (not administered)  morphine 4 MG/ML injection 6 mg (not administered)     Initial Impression / Assessment and Plan / ED Course  I have reviewed the triage vital signs and the nursing notes.  Pertinent labs & imaging results that were available during my care of the patient were reviewed by me and considered in my medical decision making (see chart for details).      Acute onset of L neck pain while simultaneously turning head to R to check blind spot and swallowing. Carotid/vertebral dissection? Airway ok on exam. Will CTa.  Ct neg for explanatory pathology. Pulled muscle? Pt noted to be mildly hypoxic on RA. No distress though and denies dyspnea. Still no stridor or increased WOB.   Final Clinical Impressions(s) / ED Diagnoses   Final diagnoses:  Acute neck pain    New Prescriptions New Prescriptions   No medications on file     Virgel Manifold, MD 10/25/16 1008

## 2016-10-03 NOTE — ED Triage Notes (Signed)
Pt reports he was at work and felt a sudden "pop"  In his throat. Denies prior sore throat. Reports hx of chronic bronchitis. Airway intact. No uvular deviation noted. Speech is clear but states hs can feel pain constantly.

## 2016-10-03 NOTE — ED Notes (Signed)
MD made aware and to see patient.

## 2016-10-03 NOTE — ED Notes (Signed)
Pt continues to be gone to CT

## 2016-10-03 NOTE — ED Notes (Signed)
MD at the bedside  

## 2016-10-03 NOTE — ED Notes (Signed)
Pt taken to CT.

## 2016-10-03 NOTE — ED Notes (Signed)
MD Wilson Singer made aware of patient's complaints and reports he will assess shortly

## 2016-10-08 ENCOUNTER — Other Ambulatory Visit: Payer: Self-pay | Admitting: Family

## 2016-12-06 ENCOUNTER — Other Ambulatory Visit: Payer: Self-pay | Admitting: *Deleted

## 2016-12-06 MED ORDER — LISINOPRIL 10 MG PO TABS: 5.0000 mg | ORAL_TABLET | Freq: Every day | ORAL | 0 refills | Status: DC

## 2017-01-07 ENCOUNTER — Other Ambulatory Visit: Payer: Self-pay | Admitting: *Deleted

## 2017-01-07 DIAGNOSIS — I10 Essential (primary) hypertension: Secondary | ICD-10-CM

## 2017-03-24 ENCOUNTER — Ambulatory Visit (INDEPENDENT_AMBULATORY_CARE_PROVIDER_SITE_OTHER): Payer: BLUE CROSS/BLUE SHIELD | Admitting: Internal Medicine

## 2017-03-24 ENCOUNTER — Ambulatory Visit (INDEPENDENT_AMBULATORY_CARE_PROVIDER_SITE_OTHER)
Admission: RE | Admit: 2017-03-24 | Discharge: 2017-03-24 | Disposition: A | Discharge status: Home / Self Care | Payer: BLUE CROSS/BLUE SHIELD | Source: Ambulatory Visit | Attending: Internal Medicine | Admitting: Internal Medicine

## 2017-03-24 ENCOUNTER — Encounter: Payer: Self-pay | Admitting: Internal Medicine

## 2017-03-24 ENCOUNTER — Other Ambulatory Visit (INDEPENDENT_AMBULATORY_CARE_PROVIDER_SITE_OTHER): Payer: BLUE CROSS/BLUE SHIELD

## 2017-03-24 VITALS — BP 146/90 | HR 89 | Temp 98.0°F | Ht 72.0 in | Wt 241.0 lb

## 2017-03-24 DIAGNOSIS — Z0000 Encounter for general adult medical examination without abnormal findings: Secondary | ICD-10-CM | POA: Insufficient documentation

## 2017-03-24 DIAGNOSIS — T24001A Burn of unspecified degree of unspecified site of right lower limb, except ankle and foot, initial encounter: Secondary | ICD-10-CM

## 2017-03-24 DIAGNOSIS — M79671 Pain in right foot: Secondary | ICD-10-CM

## 2017-03-24 DIAGNOSIS — J42 Unspecified chronic bronchitis: Secondary | ICD-10-CM

## 2017-03-24 DIAGNOSIS — R221 Localized swelling, mass and lump, neck: Secondary | ICD-10-CM

## 2017-03-24 DIAGNOSIS — G4700 Insomnia, unspecified: Secondary | ICD-10-CM | POA: Diagnosis not present

## 2017-03-24 DIAGNOSIS — R739 Hyperglycemia, unspecified: Secondary | ICD-10-CM

## 2017-03-24 DIAGNOSIS — M79672 Pain in left foot: Secondary | ICD-10-CM

## 2017-03-24 DIAGNOSIS — E559 Vitamin D deficiency, unspecified: Secondary | ICD-10-CM | POA: Diagnosis not present

## 2017-03-24 DIAGNOSIS — E538 Deficiency of other specified B group vitamins: Secondary | ICD-10-CM

## 2017-03-24 DIAGNOSIS — J449 Chronic obstructive pulmonary disease, unspecified: Secondary | ICD-10-CM | POA: Insufficient documentation

## 2017-03-24 DIAGNOSIS — R200 Anesthesia of skin: Secondary | ICD-10-CM

## 2017-03-24 DIAGNOSIS — B1920 Unspecified viral hepatitis C without hepatic coma: Secondary | ICD-10-CM | POA: Insufficient documentation

## 2017-03-24 DIAGNOSIS — Z114 Encounter for screening for human immunodeficiency virus [HIV]: Secondary | ICD-10-CM

## 2017-03-24 DIAGNOSIS — Z0001 Encounter for general adult medical examination with abnormal findings: Secondary | ICD-10-CM | POA: Diagnosis not present

## 2017-03-24 DIAGNOSIS — I10 Essential (primary) hypertension: Secondary | ICD-10-CM

## 2017-03-24 DIAGNOSIS — N320 Bladder-neck obstruction: Secondary | ICD-10-CM | POA: Diagnosis not present

## 2017-03-24 DIAGNOSIS — B1910 Unspecified viral hepatitis B without hepatic coma: Secondary | ICD-10-CM | POA: Insufficient documentation

## 2017-03-24 DIAGNOSIS — R069 Unspecified abnormalities of breathing: Secondary | ICD-10-CM | POA: Diagnosis not present

## 2017-03-24 DIAGNOSIS — E119 Type 2 diabetes mellitus without complications: Secondary | ICD-10-CM | POA: Insufficient documentation

## 2017-03-24 HISTORY — DX: Burn of unspecified degree of unspecified site of right lower limb, except ankle and foot, initial encounter: T24.001A

## 2017-03-24 LAB — CBC WITH DIFFERENTIAL/PLATELET
Basophils Absolute: 0.1 10*3/uL (ref 0.0–0.1)
Basophils Relative: 0.7 % (ref 0.0–3.0)
EOS PCT: 2.1 % (ref 0.0–5.0)
Eosinophils Absolute: 0.2 10*3/uL (ref 0.0–0.7)
HCT: 45.3 % (ref 39.0–52.0)
Hemoglobin: 15.4 g/dL (ref 13.0–17.0)
LYMPHS ABS: 3.3 10*3/uL (ref 0.7–4.0)
Lymphocytes Relative: 41.7 % (ref 12.0–46.0)
MCHC: 34 g/dL (ref 30.0–36.0)
MCV: 88.2 fl (ref 78.0–100.0)
MONO ABS: 1 10*3/uL (ref 0.1–1.0)
Monocytes Relative: 12.1 % — ABNORMAL HIGH (ref 3.0–12.0)
NEUTROS PCT: 43.4 % (ref 43.0–77.0)
Neutro Abs: 3.4 10*3/uL (ref 1.4–7.7)
PLATELETS: 215 10*3/uL (ref 150.0–400.0)
RBC: 5.14 Mil/uL (ref 4.22–5.81)
RDW: 13.2 % (ref 11.5–15.5)
WBC: 7.9 10*3/uL (ref 4.0–10.5)

## 2017-03-24 LAB — URINALYSIS, ROUTINE W REFLEX MICROSCOPIC
Bilirubin Urine: NEGATIVE
KETONES UR: NEGATIVE
Leukocytes, UA: NEGATIVE
NITRITE: NEGATIVE
Total Protein, Urine: NEGATIVE
URINE GLUCOSE: NEGATIVE
Urobilinogen, UA: 0.2 (ref 0.0–1.0)
pH: 5.5 (ref 5.0–8.0)

## 2017-03-24 LAB — HEMOGLOBIN A1C: HEMOGLOBIN A1C: 7.5 % — AB (ref 4.6–6.5)

## 2017-03-24 MED ORDER — AMLODIPINE BESYLATE 5 MG PO TABS: 5.0000 mg | ORAL_TABLET | Freq: Every day | ORAL | 3 refills | Status: DC

## 2017-03-24 MED ORDER — LISINOPRIL 10 MG PO TABS: 5.0000 mg | ORAL_TABLET | Freq: Every day | ORAL | 3 refills | Status: DC

## 2017-03-24 MED ORDER — TRAZODONE HCL 50 MG PO TABS: 25.0000 mg | ORAL_TABLET | Freq: Every evening | ORAL | 1 refills | Status: DC | PRN

## 2017-03-24 MED ORDER — ALBUTEROL SULFATE HFA 108 (90 BASE) MCG/ACT IN AERS: 2.0000 | INHALATION_SPRAY | RESPIRATORY_TRACT | 11 refills | Status: DC | PRN

## 2017-03-24 MED ORDER — GABAPENTIN 300 MG PO CAPS: ORAL_CAPSULE | ORAL | 1 refills | Status: DC

## 2017-03-24 NOTE — Patient Instructions (Addendum)
Please take all new medication as prescribed - the trazodone for sleep  Please continue all other medications as before, and refills have been done if requested - all other meds restarted  Please have the pharmacy call with any other refills you may need.  Please continue your efforts at being more active, low cholesterol diet, and weight control.  You are otherwise up to date with prevention measures today.  Please keep your appointments with your specialists as you may have planned  You will be contacted regarding the referral for: Lung testing (pulmonary functions tests), pulmonary,  and CT scan of the neck  Please go to the XRAY Department in the Basement (go straight as you get off the elevator) for the x-ray testing  Please go to the LAB in the Basement (turn left off the elevator) for the tests to be done today  You will be contacted by phone if any changes need to be made immediately.  Otherwise, you will receive a letter about your results with an explanation, but please check with MyChart first.  Please remember to sign up for MyChart if you have not done so, as this will be important to you in the future with finding out test results, communicating by private email, and scheduling acute appointments online when needed.  Please return in 6 months, or sooner if needed
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
## 2017-03-24 NOTE — Assessment & Plan Note (Addendum)
No evidence for recent infection, is former smoker, will be for CT neck w CM  In addition to the time spent performing CPE, I spent an additional 40 minutes face to face,in which greater than 50% of this time was spent in counseling and coordination of care for patient's acute illness as documented, including the differential dx, treatment, further evaluation and other management of bilat neck mass, copd, bilat foot pain, right great toe pain, left wrist pain, abnormal breathing with sleep, HTN, hyperglycemia, insomnia, and chronic right upper leg pain and numbness

## 2017-03-24 NOTE — Assessment & Plan Note (Signed)
BP Readings from Last 3 Encounters:  03/24/17 (!) 146/90  10/03/16 (!) 131/96  03/18/16 124/80  to restart meds, f/u BP at home and next visit

## 2017-03-24 NOTE — Assessment & Plan Note (Signed)
C/w bilat plantar fasciitis, for wt loss, mobic prn; also same for left wrist and right great toe MTP DJD
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
## 2017-03-24 NOTE — Assessment & Plan Note (Addendum)
To cont inhaler prn, for PFT's to further assess

## 2017-03-24 NOTE — Assessment & Plan Note (Signed)

## 2017-03-24 NOTE — Assessment & Plan Note (Signed)
Per pt states wife says he stops breathing at night ; for pulm referral, work on wt loss

## 2017-03-24 NOTE — Assessment & Plan Note (Addendum)
Asympt, for a1c with labs today

## 2017-03-24 NOTE — Assessment & Plan Note (Signed)
[place] for trazodone qhs to help hold off early AM awakenings

## 2017-03-25 ENCOUNTER — Telehealth: Payer: Self-pay

## 2017-03-25 ENCOUNTER — Encounter: Payer: Self-pay | Admitting: Internal Medicine

## 2017-03-25 ENCOUNTER — Other Ambulatory Visit: Payer: Self-pay | Admitting: Internal Medicine

## 2017-03-25 DIAGNOSIS — R7989 Other specified abnormal findings of blood chemistry: Secondary | ICD-10-CM

## 2017-03-25 LAB — VITAMIN B12: Vitamin B-12: 611 pg/mL (ref 211–911)

## 2017-03-25 LAB — BASIC METABOLIC PANEL
BUN: 12 mg/dL (ref 6–23)
CALCIUM: 9.9 mg/dL (ref 8.4–10.5)
CHLORIDE: 104 meq/L (ref 96–112)
CO2: 30 meq/L (ref 19–32)
Creatinine, Ser: 1.03 mg/dL (ref 0.40–1.50)
GFR: 94.37 mL/min (ref >60.00)
GLUCOSE: 125 mg/dL — AB (ref 70–99)
POTASSIUM: 4.6 meq/L (ref 3.5–5.1)
SODIUM: 139 meq/L (ref 135–145)

## 2017-03-25 LAB — LIPID PANEL
CHOL/HDL RATIO: 6
Cholesterol: 220 mg/dL — ABNORMAL HIGH (ref 0–200)
HDL: 34.9 mg/dL — AB (ref >39.00)
NONHDL: 185.57
Triglycerides: 290 mg/dL — ABNORMAL HIGH (ref 0.0–149.0)
VLDL: 58 mg/dL — AB (ref 0.0–40.0)

## 2017-03-25 LAB — HEPATIC FUNCTION PANEL
ALT: 23 U/L (ref 0–53)
AST: 19 U/L (ref 0–37)
Albumin: 4.3 g/dL (ref 3.5–5.2)
Alkaline Phosphatase: 63 U/L (ref 39–117)
BILIRUBIN TOTAL: 0.4 mg/dL (ref 0.2–1.2)
Bilirubin, Direct: 0.1 mg/dL (ref 0.0–0.3)
Total Protein: 7.5 g/dL (ref 6.0–8.3)

## 2017-03-25 LAB — URIC ACID: Uric Acid, Serum: 5.6 mg/dL (ref 4.0–7.8)

## 2017-03-25 LAB — VITAMIN D 25 HYDROXY (VIT D DEFICIENCY, FRACTURES): VITD: 26.34 ng/mL — ABNORMAL LOW (ref 30.00–100.00)

## 2017-03-25 LAB — PSA: PSA: 1.61 ng/mL (ref 0.10–4.00)

## 2017-03-25 LAB — TSH: TSH: 0.12 u[IU]/mL — AB (ref 0.35–4.50)

## 2017-03-25 LAB — HIV ANTIBODY (ROUTINE TESTING W REFLEX): HIV 1&2 Ab, 4th Generation: NONREACTIVE

## 2017-03-25 LAB — LDL CHOLESTEROL, DIRECT: Direct LDL: 144 mg/dL

## 2017-03-25 MED ORDER — ROSUVASTATIN CALCIUM 20 MG PO TABS: 20.0000 mg | ORAL_TABLET | Freq: Every day | ORAL | 3 refills | Status: DC

## 2017-03-25 MED ORDER — VITAMIN D (ERGOCALCIFEROL) 1.25 MG (50000 UNIT) PO CAPS: 50000.0000 [IU] | ORAL_CAPSULE | ORAL | 0 refills | Status: DC

## 2017-03-25 MED ORDER — METFORMIN HCL ER 500 MG PO TB24: 500.0000 mg | ORAL_TABLET | Freq: Every day | ORAL | 3 refills | Status: DC

## 2017-03-25 NOTE — Telephone Encounter (Signed)
Pt's wife has been informed and expressed understanding. She will let the pt know when he gets off of work.

## 2017-03-25 NOTE — Telephone Encounter (Signed)
-----   Message from Biagio Borg, MD sent at 03/25/2017 12:29 PM EST ----- Letter sent, cont same tx except  he test results show that your current treatment is OK, except for several things, including low Vitamin D, mildly elevated A1c (sugar), moderately elevated LDL cholesterol, and a surprising low TSH, indicating a possible overactive thyroid.   At this time, we need to treat the Vit D with 50000 units weekly for 12 wks, then take 2000 units per day OTC after that (indefinitely).    Please also start metformin ER 500 mg per day for sugar.  Please also start generic for Crestor 20 mg per day for cholesterol.    We will also need to refer you to Endocrinology for possible overactive thyroid.    Shirron to please inform pt, I will do referral, and rx x 3

## 2017-04-04 ENCOUNTER — Encounter: Payer: Self-pay | Admitting: Internal Medicine

## 2017-04-04 ENCOUNTER — Ambulatory Visit (INDEPENDENT_AMBULATORY_CARE_PROVIDER_SITE_OTHER)
Admission: RE | Admit: 2017-04-04 | Discharge: 2017-04-04 | Disposition: A | Discharge status: Home / Self Care | Payer: BLUE CROSS/BLUE SHIELD | Source: Ambulatory Visit | Attending: Internal Medicine | Admitting: Internal Medicine

## 2017-04-04 DIAGNOSIS — R221 Localized swelling, mass and lump, neck: Secondary | ICD-10-CM

## 2017-04-04 MED ORDER — IOPAMIDOL (ISOVUE-300) INJECTION 61%
75.0000 mL | Freq: Once | INTRAVENOUS | Status: AC | PRN
  Administered 2017-04-04: 75 mL via INTRAVENOUS

## 2017-04-11 ENCOUNTER — Encounter: Payer: Self-pay | Admitting: Internal Medicine

## 2017-04-18 ENCOUNTER — Institutional Professional Consult (permissible substitution): Payer: BLUE CROSS/BLUE SHIELD | Admitting: Internal Medicine

## 2017-04-21 ENCOUNTER — Ambulatory Visit: Payer: BLUE CROSS/BLUE SHIELD | Admitting: Internal Medicine

## 2017-04-21 LAB — PULMONARY FUNCTION TEST
DL/VA % PRED: 54 %
DL/VA: 2.5 ml/min/mmHg/L
DLCO COR % PRED: 39 %
DLCO COR: 12.9 ml/min/mmHg
DLCO UNC % PRED: 40 %
DLCO unc: 13.18 ml/min/mmHg
FEF 25-75 PRE: 1.4 L/s
FEF 25-75 Post: 1.53 L/sec
FEF2575-%Change-Post: 9 %
FEF2575-%PRED-POST: 53 %
FEF2575-%Pred-Pre: 48 %
FEV1-%Change-Post: 4 %
FEV1-%Pred-Post: 68 %
FEV1-%Pred-Pre: 66 %
FEV1-PRE: 2.05 L
FEV1-Post: 2.13 L
FEV1FVC-%Change-Post: -2 %
FEV1FVC-%Pred-Pre: 89 %
FEV6-%CHANGE-POST: 6 %
FEV6-%PRED-POST: 78 %
FEV6-%Pred-Pre: 74 %
FEV6-POST: 3.03 L
FEV6-Pre: 2.85 L
FEV6FVC-%CHANGE-POST: 1 %
FEV6FVC-%PRED-POST: 102 %
FEV6FVC-%PRED-PRE: 101 %
FVC-%Change-Post: 6 %
FVC-%Pred-Post: 78 %
FVC-%Pred-Pre: 73 %
FVC-Post: 3.13 L
FVC-Pre: 2.92 L
POST FEV1/FVC RATIO: 68 %
Post FEV6/FVC ratio: 99 %
Pre FEV1/FVC ratio: 70 %
Pre FEV6/FVC Ratio: 98 %

## 2017-04-21 NOTE — Progress Notes (Signed)
Patient completed full PFT today. 

## 2017-04-23 ENCOUNTER — Other Ambulatory Visit: Payer: Self-pay | Admitting: Internal Medicine

## 2017-04-23 DIAGNOSIS — R942 Abnormal results of pulmonary function studies: Secondary | ICD-10-CM

## 2017-04-24 ENCOUNTER — Telehealth: Payer: Self-pay

## 2017-04-24 NOTE — Telephone Encounter (Signed)
Called pt, LVM.   CRM created.  

## 2017-04-24 NOTE — Telephone Encounter (Signed)
-----   Message from Biagio Borg, MD sent at 04/23/2017  5:41 PM EDT ----- Left message on MyChart, pt to cont same tx except  The test results show that your current treatment is OK, except the lung testing did show a severe "diffusion abnormality" The etiology for this is not clear, so we should refer you to Pulmonary for further evaluation.Redmond Baseman to please inform pt, I will do referral

## 2017-04-25 ENCOUNTER — Telehealth: Payer: Self-pay | Admitting: Internal Medicine

## 2017-04-25 NOTE — Telephone Encounter (Signed)
We do not treat this patient- he is a Dr. Jenny Reichmann pt.  It appears that a referral has been placed to our office based on PFT.    lmtcb for pt- will need to contact Dr. Jenny Reichmann for results.

## 2017-04-28 ENCOUNTER — Ambulatory Visit: Payer: BLUE CROSS/BLUE SHIELD

## 2017-04-28 NOTE — Telephone Encounter (Signed)
Spoke with patient's wife. She stated that the patient was currently at work. Advised her that he or she will need to contact Dr. Gwynn Burly office in regards to results. Per patient's chart, Dr. Gwynn Burly office attempted to call him last week but was unable to reach him.   Patient's wife verbalized understanding. Nothing else needed at time of call.

## 2017-04-30 ENCOUNTER — Ambulatory Visit (INDEPENDENT_AMBULATORY_CARE_PROVIDER_SITE_OTHER)
Admission: RE | Admit: 2017-04-30 | Discharge: 2017-04-30 | Disposition: A | Discharge status: Home / Self Care | Payer: BLUE CROSS/BLUE SHIELD | Source: Ambulatory Visit | Attending: Internal Medicine | Admitting: Internal Medicine

## 2017-04-30 ENCOUNTER — Other Ambulatory Visit: Payer: Self-pay | Admitting: Internal Medicine

## 2017-04-30 ENCOUNTER — Encounter: Payer: Self-pay | Admitting: Internal Medicine

## 2017-04-30 ENCOUNTER — Ambulatory Visit (INDEPENDENT_AMBULATORY_CARE_PROVIDER_SITE_OTHER): Payer: BLUE CROSS/BLUE SHIELD | Admitting: Internal Medicine

## 2017-04-30 VITALS — BP 138/76 | HR 81 | Temp 98.9°F | Ht 72.0 in | Wt 238.0 lb

## 2017-04-30 DIAGNOSIS — M79604 Pain in right leg: Secondary | ICD-10-CM | POA: Diagnosis not present

## 2017-04-30 DIAGNOSIS — M25561 Pain in right knee: Secondary | ICD-10-CM | POA: Diagnosis not present

## 2017-04-30 DIAGNOSIS — J42 Unspecified chronic bronchitis: Secondary | ICD-10-CM

## 2017-04-30 DIAGNOSIS — M25532 Pain in left wrist: Secondary | ICD-10-CM

## 2017-04-30 DIAGNOSIS — M1611 Unilateral primary osteoarthritis, right hip: Secondary | ICD-10-CM | POA: Diagnosis not present

## 2017-04-30 DIAGNOSIS — R200 Anesthesia of skin: Secondary | ICD-10-CM

## 2017-04-30 DIAGNOSIS — I10 Essential (primary) hypertension: Secondary | ICD-10-CM

## 2017-04-30 DIAGNOSIS — M25432 Effusion, left wrist: Secondary | ICD-10-CM | POA: Diagnosis not present

## 2017-04-30 DIAGNOSIS — R942 Abnormal results of pulmonary function studies: Secondary | ICD-10-CM

## 2017-04-30 DIAGNOSIS — M19032 Primary osteoarthritis, left wrist: Secondary | ICD-10-CM | POA: Diagnosis not present

## 2017-04-30 DIAGNOSIS — H1010 Acute atopic conjunctivitis, unspecified eye: Secondary | ICD-10-CM | POA: Insufficient documentation

## 2017-04-30 DIAGNOSIS — H1013 Acute atopic conjunctivitis, bilateral: Secondary | ICD-10-CM

## 2017-04-30 DIAGNOSIS — M79651 Pain in right thigh: Secondary | ICD-10-CM | POA: Diagnosis not present

## 2017-04-30 MED ORDER — BUDESONIDE-FORMOTEROL FUMARATE 160-4.5 MCG/ACT IN AERO: 2.0000 | INHALATION_SPRAY | Freq: Two times a day (BID) | RESPIRATORY_TRACT | 5 refills | Status: DC

## 2017-04-30 MED ORDER — GABAPENTIN 300 MG PO CAPS: ORAL_CAPSULE | ORAL | 1 refills | Status: DC

## 2017-04-30 MED ORDER — TRIAMCINOLONE ACETONIDE 55 MCG/ACT NA AERO: 2.0000 | INHALATION_SPRAY | Freq: Every day | NASAL | 12 refills | Status: DC

## 2017-04-30 MED ORDER — AZELASTINE HCL 0.05 % OP SOLN: 1.0000 [drp] | Freq: Two times a day (BID) | OPHTHALMIC | 12 refills | Status: DC

## 2017-04-30 MED ORDER — HYDROCODONE-ACETAMINOPHEN 7.5-325 MG PO TABS: 1.0000 | ORAL_TABLET | Freq: Four times a day (QID) | ORAL | 0 refills | Status: DC | PRN

## 2017-04-30 NOTE — Assessment & Plan Note (Signed)
stable overall by history and exam, recent data reviewed with pt, and pt to continue medical treatment as before,  to f/u any worsening symptoms or concerns BP Readings from Last 3 Encounters:  04/30/17 138/76  03/24/17 (!) 146/90  10/03/16 (!) 131/96

## 2017-04-30 NOTE — Assessment & Plan Note (Signed)
As above.

## 2017-04-30 NOTE — Patient Instructions (Addendum)
Please take all new medication as prescribed - the symbicort, and the pain medication  Please take all new medication as prescribed - the optivar eye drops and nasacort for allergies  OK to increase the gabapentin to three times per day  Please continue all other medications as before, and refills have been done if requested.  Please have the pharmacy call with any other refills you may need.  Please continue your efforts at being more active, low cholesterol diet, and weight control.  You are otherwise up to date with prevention measures today.  You will be contacted regarding the referral for: Neurology and Hand Surgury  Please keep your appointments with your specialists as you may have planned - Pulmonlary on [address]  If you do not need a sleep study per pulmonary, please ask for ENT referral for the snoring  Please go to the XRAY Department in the Basement (go straight as you get off the elevator) for the x-ray testing  You will be contacted by phone if any changes need to be made immediately.  Otherwise, you will receive a letter about your results with an explanation, but please check with MyChart first.  Please remember to sign up for MyChart if you have not done so, as this will be important to you in the future with finding out test results, communicating by private email, and scheduling acute appointments online when needed.

## 2017-04-30 NOTE — Assessment & Plan Note (Signed)
Ok to add symbicort asd, cont proair prn,  to f/u any worsening symptoms or concerns
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
## 2017-04-30 NOTE — Assessment & Plan Note (Addendum)
?   Meralgia paresthetica vs other, for plain films as ordered, increase gabapentin 300 tid, hydrocodone prn limited rx, refer neurology  Note:  Total time for pt hx, exam, review of record with pt in the room, determination of diagnoses and plan for further eval and tx is > 40 min, with over 50% spent in coordination and counseling of patient including the differential dx, tx, further evaluation and other management of leg pain and numbness, COPD, allergic conjunctivitis, abnormal PFTs, HTN and left wrist pain

## 2017-04-30 NOTE — Assessment & Plan Note (Signed)
Pt plans to f/u pulm apr 10

## 2017-04-30 NOTE — Assessment & Plan Note (Signed)
Most c/w localized wrist DJD related swelling/pain with use; for hand surgury referral

## 2017-04-30 NOTE — Assessment & Plan Note (Signed)
Mild to mod, for optivar and nasacort asd,  to f/u any worsening symptoms or concerns

## 2017-05-01 ENCOUNTER — Telehealth: Payer: Self-pay

## 2017-05-01 ENCOUNTER — Other Ambulatory Visit: Payer: Self-pay | Admitting: Internal Medicine

## 2017-05-01 DIAGNOSIS — I739 Peripheral vascular disease, unspecified: Secondary | ICD-10-CM

## 2017-05-01 NOTE — Telephone Encounter (Signed)
-----   Message from Biagio Borg, MD sent at 05/01/2017  8:49 AM EDT ----- Left message on MyChart, pt to cont same tx except  The test results show that your current treatment is OK, except the xray does show right hip arthritis, which could be causing some of your pain.  If the gabapentin is not helping, you may want to see orthopedic about the right hip.    Also, there was an incidental findings of calcifications in the arteries in the right hip area.  This could represent some artery blockage and slower blood flow.  Given your symptoms,  We should also check a circulation test (called arterial dopplers) to check the leg circulation as well  Shirron to please inform pt, I will do order for the arterial dopplers

## 2017-05-01 NOTE — Telephone Encounter (Signed)
Called pt. LVM  CRM created

## 2017-05-02 ENCOUNTER — Encounter: Payer: Self-pay | Admitting: Neurology

## 2017-05-06 NOTE — Telephone Encounter (Signed)
Called pt, gave wife the results.

## 2017-05-07 ENCOUNTER — Inpatient Hospital Stay (HOSPITAL_COMMUNITY): Admission: RE | Admit: 2017-05-07 | Payer: BLUE CROSS/BLUE SHIELD | Source: Ambulatory Visit

## 2017-05-09 ENCOUNTER — Ambulatory Visit (HOSPITAL_COMMUNITY)
Admission: RE | Admit: 2017-05-09 | Discharge: 2017-05-09 | Disposition: A | Discharge status: Home / Self Care | Payer: BLUE CROSS/BLUE SHIELD | Source: Ambulatory Visit | Attending: Cardiovascular Disease | Admitting: Cardiovascular Disease

## 2017-05-09 DIAGNOSIS — I739 Peripheral vascular disease, unspecified: Secondary | ICD-10-CM

## 2017-05-12 DIAGNOSIS — M25332 Other instability, left wrist: Secondary | ICD-10-CM | POA: Diagnosis not present

## 2017-05-12 DIAGNOSIS — M25532 Pain in left wrist: Secondary | ICD-10-CM | POA: Diagnosis not present

## 2017-05-14 ENCOUNTER — Encounter: Payer: Self-pay | Admitting: Internal Medicine

## 2017-05-14 ENCOUNTER — Ambulatory Visit (INDEPENDENT_AMBULATORY_CARE_PROVIDER_SITE_OTHER): Payer: BLUE CROSS/BLUE SHIELD | Admitting: Internal Medicine

## 2017-05-14 VITALS — BP 130/82 | HR 89 | Ht 72.0 in | Wt 239.0 lb

## 2017-05-14 DIAGNOSIS — I10 Essential (primary) hypertension: Secondary | ICD-10-CM

## 2017-05-14 DIAGNOSIS — J449 Chronic obstructive pulmonary disease, unspecified: Secondary | ICD-10-CM | POA: Diagnosis not present

## 2017-05-14 MED ORDER — BUDESONIDE-FORMOTEROL FUMARATE 80-4.5 MCG/ACT IN AERO: 2.0000 | INHALATION_SPRAY | Freq: Two times a day (BID) | RESPIRATORY_TRACT | 11 refills | Status: DC

## 2017-05-14 MED ORDER — LOSARTAN POTASSIUM 50 MG PO TABS: 50.0000 mg | ORAL_TABLET | Freq: Every day | ORAL | 11 refills | Status: DC

## 2017-05-14 MED ORDER — FAMOTIDINE 20 MG PO TABS: ORAL_TABLET | ORAL | 11 refills | Status: DC

## 2017-05-14 MED ORDER — BUDESONIDE-FORMOTEROL FUMARATE 80-4.5 MCG/ACT IN AERO: 2.0000 | INHALATION_SPRAY | Freq: Two times a day (BID) | RESPIRATORY_TRACT | 0 refills | Status: DC

## 2017-05-14 MED ORDER — METHYLPREDNISOLONE ACETATE 80 MG/ML IJ SUSP
120.0000 mg | Freq: Once | INTRAMUSCULAR | Status: AC
  Administered 2017-05-14: 120 mg via INTRAMUSCULAR

## 2017-05-14 NOTE — Progress Notes (Signed)
Subjective:     Patient ID: Gabriel Coleman, male   DOB: 29-Nov-1955,   MRN: [id number]  HPI  41 yobm from [place] with tendency to uri's as child but good ex tol/ able join [employer] and good healthy despite smoking assoc cough which improved p quit 2016 band got worse p stopped downhill since with cough and doe  Some better with saba but could not afford symbicort so referred to pulmonary clinic 05/14/2017 by Dr   Cathlean Cower   05/14/2017 1st [facility] office visit/ Darleen Moffitt   GOLD II copd/ on ACEi  Chief Complaint  Patient presents with  . Pulmonary Consult    Referred by Dr. Cathlean Cower.  Pt states that he was dxed with COPD 2 yrs ago. He has been coughing with brown sputum.    producing up to sev tsp of mucus each am variable severity, color, quantity s pattern with freq awakening  Can no longer sing due to poor voice quality  symbicort works the best, uses wife's many mornings not sure what strength Mostly sob when coughing    No obvious day to day or daytime variability or assoc excess/ purulent sputum or mucus plugs or hemoptysis or cp or chest tightness, subjective wheeze or overt sinus or hb symptoms. No unusual exposure hx or h/o childhood pna/ asthma or knowledge of premature birth.    Also denies any obvious fluctuation of symptoms with weather or environmental changes or other aggravating or alleviating factors except as outlined above   Current Allergies, Complete Past Medical History, Past Surgical History, Family History, and Social History were reviewed in [employer] [employer] record.  ROS  The following are not active complaints unless bolded Hoarseness, sore throat, dysphagia, dental problems, itching, sneezing,  nasal congestion or discharge of excess mucus or purulent secretions, ear ache,   fever, chills, sweats, unintended wt loss or wt gain, classically pleuritic or exertional cp,  orthopnea pnd or arm/hand swelling  or leg swelling,  presyncope, palpitations, abdominal pain, anorexia, nausea, vomiting, diarrhea  or change in bowel habits or change in bladder habits, change in stools or change in urine, dysuria, hematuria,  rash, arthralgias, visual complaints, headache, numbness, weakness or ataxia or problems with walking or coordination,  change in mood or  memory.        Current Meds - unable to verify, very poor on recall of meds  Medication Sig  . albuterol (PROVENTIL HFA;VENTOLIN HFA) 108 (90 Base) MCG/ACT inhaler Inhale 2 puffs into the lungs every 4 (four) hours as needed for wheezing or shortness of breath.  Marland Kitchen amLODipine (NORVASC) 5 MG tablet Take 1 tablet (5 mg total) by mouth daily.  Marland Kitchen aspirin 81 MG tablet Take 81 mg by mouth daily.  Marland Kitchen azelastine (OPTIVAR) 0.05 % ophthalmic solution Place 1 drop into both eyes 2 (two) times daily.  . Calcium Carbonate Antacid (TUMS CHEWY BITES PO) Take 1 tablet by mouth daily as needed (for indigestion/reflux).   . gabapentin (NEURONTIN) 300 MG capsule TAKE 1 CAPSULE(300 MG) BY MOUTH three times daily  . HYDROcodone-acetaminophen (NORCO) 7.5-325 MG tablet Take 1 tablet by mouth every 6 (six) hours as needed for moderate pain.  Marland Kitchen ibuprofen (ADVIL,MOTRIN) 400 MG tablet Take 1 tablet (400 mg total) by mouth every 6 (six) hours as needed.  . meloxicam (MOBIC) 15 MG tablet Take 1 tablet (15 mg total) by mouth daily as needed for pain.  . metFORMIN (GLUCOPHAGE-XR) 500 MG 24 hr tablet Take  1 tablet (500 mg total) by mouth daily with breakfast.  . rosuvastatin (CRESTOR) 20 MG tablet Take 1 tablet (20 mg total) by mouth daily.  . sildenafil (REVATIO) 20 MG tablet Take 1-5 tablets (20-100 mg total) by mouth daily as needed.  Marland Kitchen tetrahydrozoline-zinc (VISINE-AC) 0.05-0.25 % ophthalmic solution Place 1-2 drops into both eyes 3 (three) times daily as needed (for redness).  . traZODone (DESYREL) 50 MG tablet Take 0.5-1 tablets (25-50 mg total) by mouth at bedtime as needed for sleep.  . Vitamin D,  Ergocalciferol, (DRISDOL) 50000 units CAPS capsule Take 1 capsule (50,000 Units total) by mouth every 7 (seven) days.  .     . [  lisinopril (PRINIVIL,ZESTRIL) 10 MG tablet Take 0.5 tablets (5 mg total) by mouth daily.         Review of Systems     Objective:   Physical Exam    Amb hoarse bm harsh upper airway cough   Wt Readings from Last 3 Encounters:  05/14/17 239 lb (108.4 kg)  04/30/17 238 lb (108 kg)  03/24/17 241 lb (109.3 kg)     Vital signs reviewed - Note on arrival 02 sats  94% on RA   HEENT: nl dentition, turbinates bilaterally, and oropharynx. Nl external ear canals without cough reflex   NECK :  without JVD/Nodes/TM/ nl carotid upstrokes bilaterally   LUNGS: no acc muscle use,  Nl contour chest  With predominantly pseudowheeze/ resolves with plm  maneuvers   CV:  RRR  no s3 or murmur or increase in P2, and no edema   ABD:  Quite obese soft and nontender with nl inspiratory excursion in the supine position. No bruits or organomegaly appreciated, bowel sounds nl  MS:  Nl gait/ ext warm without deformities, calf tenderness, cyanosis or clubbing No obvious joint restrictions   SKIN: warm and dry without lesions    NEURO:  alert, approp, nl sensorium with  no motor or cerebellar deficits apparent.         I personally reviewed images and agree with radiology impression as follows:  CXR:   03/24/17 No acute cardiopulmonary disease.     Assessment:

## 2017-05-14 NOTE — Patient Instructions (Addendum)
Stop lisinopril and start losartan 50 mg daily instead   Plan A = Automatic = Symbicort 80  Take 2 puffs first thing in am and then another 2 puffs about 12 hours later.   Work on inhaler technique:  relax and gently blow all the way out then take a nice smooth deep breath back in, triggering the inhaler at same time you start breathing in.  Hold for up to 5 seconds if you can. Blow out thru nose. Rinse and gargle with water when done      Plan B = Backup Only use your albuterol as a rescue medication to be used if you can't catch your breath by resting or doing a relaxed purse lip breathing pattern.  - The less you use it, the better it will work when you need it. - Ok to use the inhaler up to 2 puffs  every 4 hours if you must but call for appointment if use goes up over your usual need - Don't leave home without it !!  (think of it like the spare tire for your car)   Until cough goes away I recommend you add pepcid 20 mg after supper   GERD (REFLUX)  is an extremely common cause of respiratory symptoms just like yours , many times with no obvious heartburn at all.    It can be treated with medication, but also with lifestyle changes including elevation of the head of your bed (ideally with 6 inch  bed blocks),  Smoking cessation, avoidance of late meals, excessive alcohol, and avoid fatty foods, chocolate, peppermint, colas, red wine, and acidic juices such as orange juice.  NO MINT OR MENTHOL PRODUCTS SO NO COUGH DROPS   USE SUGARLESS CANDY INSTEAD (Jolley ranchers or Stover's or Life Savers) or even ice chips will also do - the key is to swallow to prevent all throat clearing. NO OIL BASED VITAMINS - use powdered substitutes.  Please schedule a follow up office visit in 4 weeks, sooner if needed  with all medications /inhalers/ solutions in hand so we can verify exactly what you are taking. This includes all medications from all doctors and over the counters

## 2017-05-15 ENCOUNTER — Encounter: Payer: Self-pay | Admitting: Internal Medicine

## 2017-05-15 NOTE — Assessment & Plan Note (Addendum)
Changed ace to arb 05/14/2017 due to cough and copd symptoms out of proportion to severity of copd  In the best review of chronic cough to date ( NEJM 2016 375 1544-1551) ,  ACEi are now felt to cause cough in up to  20% of pts which is a 4 fold increase from previous reports and does not include the variety of non-specific complaints we see in pulmonary clinic in pts on ACEi but previously attributed to another dx like  Copd/asthma and  include PNDS, throat and chest congestion, "bronchitis", unexplained dyspnea and noct "strangling" sensations, and hoarseness, but also  atypical /refractory GERD symptoms like dysphagia and "bad heartburn"   The only way I know  to prove this is not an "ACEi Case" is a trial off ACEi x a minimum of 6 weeks then regroup.    Try losartan 50 mg daily then f/u with ov in 4 weeks to see if he's improving

## 2017-05-15 NOTE — Assessment & Plan Note (Addendum)
-   PFT's  04/21/17  FEV1 2.13 (68 % ) ratio 68  p 4 % improvement from saba p nothing prior to study with DLCO  40/34 % corrects to 54  % for alv volume   -05/14/2017  After extensive coaching inhaler device  effectiveness =    75% > try symbicort 80 2 bid   DDX of  difficult airways management almost all start with A and  include Adherence, Ace Inhibitors, Acid Reflux, Active Sinus Disease, Alpha 1 Antitripsin deficiency, Anxiety masquerading as Airways dz,  ABPA,  Allergy(esp in young), Aspiration (esp in elderly), Adverse effects of meds,  Active smokers, A bunch of PE's (a small clot burden can't cause this syndrome unless there is already severe underlying pulm or vascular dz with poor reserve) plus two Bs  = Bronchiectasis and Beta blocker use..and one C= CHF  Adherence is always the initial "prime suspect" and is a multilayered concern that requires a "trust but verify" approach in every patient - starting with knowing how to use medications, especially inhalers, correctly, keeping up with refills and understanding the fundamental difference between maintenance and prns vs those medications only taken for a very short course and then stopped and not refilled.  - see hfa teaching  - very shaky on details of care >> return with all meds in hand using a trust but verify approach to confirm accurate Medication  Reconciliation The principal here is that until we are certain that the  patients are doing what we've asked, it makes no sense to ask them to do more.    ACEi adverse effects at the  top of the usual list of suspects and the only way to rule it out is a trial off > see a/p    ? Acid reflux > add pepcid at hs plus diet/ hold off on ppi for now until off acei if not better    ? Active sinus dz > sinus ct next step if not better / continue nasal for now   ? Active smoking > denies, says all his problems worsened p he quit which is a red flag that this is not just the usual copd case    Total  time devoted to counseling  > 50 % of initial 60 min office visit:  review case with pt/ discussion of options/alternatives/ personally creating written customized instructions  in presence of pt  then going over those specific  Instructions directly with the pt including how to use all of the meds but in particular covering each new medication in detail and the difference between the maintenance= "automatic" meds and the prns using an action plan format for the latter (If this problem/symptom => do that organization reading Left to right).  Please see AVS from this visit for a full list of these instructions which I personally wrote for this pt and  are unique to this visit.

## 2017-06-04 ENCOUNTER — Other Ambulatory Visit: Payer: Self-pay | Admitting: Orthopedic Surgery

## 2017-06-04 DIAGNOSIS — S63512A Sprain of carpal joint of left wrist, initial encounter: Secondary | ICD-10-CM

## 2017-06-06 ENCOUNTER — Encounter: Payer: Self-pay | Admitting: Endocrinology

## 2017-06-06 ENCOUNTER — Ambulatory Visit (INDEPENDENT_AMBULATORY_CARE_PROVIDER_SITE_OTHER): Payer: BLUE CROSS/BLUE SHIELD | Admitting: Endocrinology

## 2017-06-06 DIAGNOSIS — E0590 Thyrotoxicosis, unspecified without thyrotoxic crisis or storm: Secondary | ICD-10-CM | POA: Diagnosis not present

## 2017-06-06 NOTE — Patient Instructions (Addendum)
blood tests are requested for you today.  We'll let you know about the results. If it is overactive again, I'll prescribe you a once a day medication for this. If ever you have fever while taking this, stop it and call us, even if the reason is obvious, because of the risk of a rare side-effect. Please come back for a follow-up appointment in 1 month.       Hyperthyroidism Hyperthyroidism is when the thyroid is too active (overactive). Your thyroid is a large gland that is located in your neck. The thyroid helps to control how your body uses food (metabolism). When your thyroid is overactive, it produces too much of a hormone called thyroxine. What are the causes? Causes of hyperthyroidism may include:  Graves disease. This is when your immune system attacks the thyroid gland. This is the most common cause.  Inflammation of the thyroid gland.  Tumor in the thyroid gland or somewhere else.  Excessive use of thyroid medicines, including: ? Prescription thyroid supplement. ? Herbal supplements that mimic thyroid hormones.  Solid or fluid-filled lumps within your thyroid gland (thyroid nodules).  Excessive ingestion of iodine.  What increases the risk?  Being male.  Having a family history of thyroid conditions. What are the signs or symptoms? Signs and symptoms of hyperthyroidism may include:  Nervousness.  Inability to tolerate heat.  Unexplained weight loss.  Diarrhea.  Change in the texture of hair or skin.  Heart skipping beats or making extra beats.  Rapid heart rate.  Loss of menstruation.  Shaky hands.  Fatigue.  Restlessness.  Increased appetite.  Sleep problems.  Enlarged thyroid gland or nodules.  How is this diagnosed? Diagnosis of hyperthyroidism may include:  Medical history and physical exam.  Blood tests.  Ultrasound tests.  How is this treated? Treatment may include:  Medicines to control your thyroid.  Surgery to remove  your thyroid.  Radiation therapy.  Follow these instructions at home:  Take medicines only as directed by your health care provider.  Do not use any tobacco products, including cigarettes, chewing tobacco, or electronic cigarettes. If you need help quitting, ask your health care provider.  Do not exercise or do physical activity until your health care provider approves.  Keep all follow-up appointments as directed by your health care provider. This is important. Contact a health care provider if:  Your symptoms do not get better with treatment.  You have fever.  You are taking thyroid replacement medicine and you: ? Have depression. ? Feel mentally and physically slow. ? Have weight gain. Get help right away if:  You have decreased alertness or a change in your awareness.  You have abdominal pain.  You feel dizzy.  You have a rapid heartbeat.  You have an irregular heartbeat. This information is not intended to replace advice given to you by your health care provider. Make sure you discuss any questions you have with your health care provider. Document Released: 01/21/2005 Document Revised: 06/22/2015 Document Reviewed: 06/08/2013 Elsevier Interactive Patient Education  2018 [employer].
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
## 2017-06-07 LAB — T4, FREE: FREE T4: 1.38 ng/dL (ref 0.82–1.77)

## 2017-06-07 LAB — TSH: TSH: 0.139 u[IU]/mL — ABNORMAL LOW (ref 0.450–4.500)

## 2017-06-07 MED ORDER — METHIMAZOLE 5 MG PO TABS: 5.0000 mg | ORAL_TABLET | Freq: Every day | ORAL | 5 refills | Status: DC

## 2017-06-11 ENCOUNTER — Encounter: Payer: Self-pay | Admitting: Internal Medicine

## 2017-06-11 ENCOUNTER — Ambulatory Visit (INDEPENDENT_AMBULATORY_CARE_PROVIDER_SITE_OTHER): Payer: BLUE CROSS/BLUE SHIELD | Admitting: Internal Medicine

## 2017-06-11 VITALS — BP 114/74 | HR 74 | Ht 72.0 in | Wt 236.0 lb

## 2017-06-11 DIAGNOSIS — J449 Chronic obstructive pulmonary disease, unspecified: Secondary | ICD-10-CM

## 2017-06-11 DIAGNOSIS — R05 Cough: Secondary | ICD-10-CM | POA: Diagnosis not present

## 2017-06-11 DIAGNOSIS — R058 Other specified cough: Secondary | ICD-10-CM

## 2017-06-11 MED ORDER — PREDNISONE 10 MG PO TABS: ORAL_TABLET | ORAL | 0 refills | Status: DC

## 2017-06-11 MED ORDER — PANTOPRAZOLE SODIUM 40 MG PO TBEC: 40.0000 mg | DELAYED_RELEASE_TABLET | Freq: Every day | ORAL | 2 refills | Status: DC

## 2017-06-11 MED ORDER — ACETAMINOPHEN-CODEINE #3 300-30 MG PO TABS
1.0000 | ORAL_TABLET | ORAL | 0 refills | Status: DC | PRN
Start: 2017-06-11 — End: 2017-08-22

## 2017-06-11 NOTE — Progress Notes (Signed)
Subjective:     Patient ID: Gabriel Coleman, male   DOB: 1955-08-28,   MRN: [id number]    Brief patient profile:  78 yobm from [place] with tendency to uri's as child but good ex tol/ able join [employer] and good healthy despite smoking assoc cough which improved p quit 2016 then  got worse p stopped downhill since with cough and doe  some better with saba but could not afford symbicort so referred to pulmonary clinic 05/14/2017 by Dr   Gabriel Coleman   History of Present Illness  05/14/2017 1st [facility] office visit/ Gabriel Coleman   GOLD II copd/ on ACEi  Chief Complaint  Patient presents with  . Pulmonary Consult    Referred by Dr. Cathlean Coleman.  Pt states that he was dxed with COPD 2 yrs ago. He has been coughing with brown sputum.    producing up to sev tsp of mucus each am variable severity, color, quantity s pattern with freq awakening  Can no longer sing due to poor voice quality  symbicort works the best, uses wife's many mornings not sure what strength Mostly sob when coughing  rec Stop lisinopril and start losartan 50 mg daily instead  Plan A = Automatic = Symbicort 80  Take 2 puffs first thing in am and then another 2 puffs about 12 hours later.  Work on inhaler technique:  relax and gently blow all the way out then take a nice smooth deep breath back in, triggering the inhaler at same time you start breathing in.  Hold for up to 5 seconds if you can. Blow out thru nose. Rinse and gargle with water when done Plan B = Backup Only use your albuterol as a rescue medication Until cough goes away I recommend you add pepcid 20 mg after supper  GERD (REFLUX) diet  Please schedule a follow up office visit in 4 weeks, sooner if needed  with all medications /inhalers/ solutions in hand so we can verify exactly what you are taking. This includes all medications from all doctors and over the counters    06/11/2017  f/u ov/Gabriel Coleman re:  GOLD II copd/ uacs off acei x 4 weeks - did not bring meds  as req Chief Complaint  Patient presents with  . Follow-up    "Overall feeling better but the cough is unchanged".  He is coughing up large amounts of yellow tinged sputum. He states he sometimes coughs until he feels faint. He is using his albuterol inhaler 2 x daily on average.    Dyspnea:  MMRC1 = can walk nl pace, flat grade, can't hurry or go uphills or steps s sob   Cough: same as prior ov / worse at hs / some better with saba/ recurs at work really not all that productive to for the amt and severity of his coughing which nears presyncope on occasions  Sleep: on side on pillow coughs 10 -15 min a 2 tbsp slt yellow  p hs SABA use:  Bid    No obvious patterns in day to day or daytime variability or assoc   mucus plugs or hemoptysis or cp or chest tightness, subjective wheeze or overt sinus or hb symptoms. No unusual exposure hx or h/o childhood pna/ asthma or knowledge of premature birth.    Also denies any obvious fluctuation of symptoms with weather or environmental changes or other aggravating or alleviating factors except as outlined above   Current Allergies, Complete Past Medical History, Past  Surgical History, Family History, and Social History were reviewed in [facility] record.  ROS  The following are not active complaints unless bolded Hoarseness, sore throat, dysphagia, dental problems, itching, sneezing,  nasal congestion or discharge of excess mucus or purulent secretions, ear ache,   fever, chills, sweats, unintended wt loss or wt gain, classically pleuritic or exertional cp,  orthopnea pnd or arm/hand swelling  or leg swelling, presyncope with coughing fits, palpitations, abdominal pain, anorexia, nausea, vomiting, diarrhea  or change in bowel habits or change in bladder habits, change in stools or change in urine, dysuria, hematuria,  rash, arthralgias, visual complaints, headache, numbness, weakness or ataxia or problems with walking or coordination,   change in mood or  memory.        Current Meds  Medication Sig  . albuterol (PROVENTIL HFA;VENTOLIN HFA) 108 (90 Base) MCG/ACT inhaler Inhale 2 puffs into the lungs every 4 (four) hours as needed for wheezing or shortness of breath.  Marland Kitchen amLODipine (NORVASC) 5 MG tablet Take 1 tablet (5 mg total) by mouth daily.  Marland Kitchen aspirin 81 MG tablet Take 81 mg by mouth daily.  Marland Kitchen azelastine (OPTIVAR) 0.05 % ophthalmic solution Place 1 drop into both eyes 2 (two) times daily.  . budesonide-formoterol (SYMBICORT) 80-4.5 MCG/ACT inhaler Inhale 2 puffs into the lungs 2 (two) times daily.  . Calcium Carbonate Antacid (TUMS CHEWY BITES PO) Take 1 tablet by mouth daily as needed (for indigestion/reflux).   . famotidine (PEPCID) 20 MG tablet One at bedtime  . gabapentin (NEURONTIN) 300 MG capsule TAKE 1 CAPSULE(300 MG) BY MOUTH three times daily  . HYDROcodone-acetaminophen (NORCO) 7.5-325 MG tablet Take 1 tablet by mouth every 6 (six) hours as needed for moderate pain.  Marland Kitchen ibuprofen (ADVIL,MOTRIN) 400 MG tablet Take 1 tablet (400 mg total) by mouth every 6 (six) hours as needed.  Marland Kitchen losartan (COZAAR) 50 MG tablet Take 1 tablet (50 mg total) by mouth daily.  . Magnesium 250 MG TABS Take 250 mg by mouth daily.   . meloxicam (MOBIC) 15 MG tablet Take 1 tablet (15 mg total) by mouth daily as needed for pain.  . metFORMIN (GLUCOPHAGE-XR) 500 MG 24 hr tablet Take 1 tablet (500 mg total) by mouth daily with breakfast.  . methimazole (TAPAZOLE) 5 MG tablet Take 1 tablet (5 mg total) by mouth daily.  . rosuvastatin (CRESTOR) 20 MG tablet Take 1 tablet (20 mg total) by mouth daily.  . sildenafil (REVATIO) 20 MG tablet Take 1-5 tablets (20-100 mg total) by mouth daily as needed.  Marland Kitchen tetrahydrozoline-zinc (VISINE-AC) 0.05-0.25 % ophthalmic solution Place 1-2 drops into both eyes 3 (three) times daily as needed (for redness).  . traZODone (DESYREL) 50 MG tablet Take 0.5-1 tablets (25-50 mg total) by mouth at bedtime as needed for  sleep.  Marland Kitchen triamcinolone (NASACORT) 55 MCG/ACT AERO nasal inhaler Place 2 sprays into the nose daily.  . Vitamin D, Ergocalciferol, (DRISDOL) 50000 units CAPS capsule Take 1 capsule (50,000 Units total) by mouth every 7 (seven) days.                      Objective:   Physical Exam    .amb hoarse bm nad very harsh forced upper airway pattern cough / slt cushingnoid?     06/11/2017          236   05/14/17 239 lb (108.4 kg)  04/30/17 238 lb (108 kg)  03/24/17 241 lb (109.3 kg)  Vital signs reviewed - Note on arrival 02 sats  95% on RA      HEENT: nl dentition , and oropharynx. Nl external ear canals without cough reflex - moderately severe  bilateral non-specific turbinate edema     NECK :  without JVD/Nodes/TM/ nl carotid upstrokes bilaterally   LUNGS: no acc muscle use,  Nl contour chest which is clear to A and P bilaterally without cough on insp or exp maneuvers   CV:  RRR  no s3 or murmur or increase in P2, and no edema   ABD:  soft and nontender with nl inspiratory excursion in the supine position. No bruits or organomegaly appreciated, bowel sounds nl  MS:  Nl gait/ ext warm without deformities, calf tenderness, cyanosis or clubbing No obvious joint restrictions   SKIN: warm and dry without lesions    NEURO:  alert, approp, nl sensorium with  no motor or cerebellar deficits apparent.            Assessment:

## 2017-06-11 NOTE — Patient Instructions (Addendum)
Take delsym two tsp every 12 hours and supplement if needed with  Tylenol #3 up to 1-2 every 4 hours to suppress the urge to cough. Swallowing water and/or using ice chips/non mint and menthol containing candies (such as lifesavers or sugarless jolly ranchers) are also effective.  You should rest your voice and avoid activities that you know make you cough.  Once you have eliminated the cough for 3 straight days try reducing the tylenol #3  first,  then the mucinex dm  as tolerated.   Protonix 40 mg Take 30-60 min before first meal of the day   GERD (REFLUX)  is an extremely common cause of respiratory symptoms just like yours , many times with no obvious heartburn at all.    It can be treated with medication, but also with lifestyle changes including elevation of the head of your bed (ideally with 6 inch  bed blocks),  Smoking cessation, avoidance of late meals, excessive alcohol, and avoid fatty foods, chocolate, peppermint, colas, red wine, and acidic juices such as orange juice.  NO MINT OR MENTHOL PRODUCTS SO NO COUGH DROPS  USE SUGARLESS CANDY INSTEAD (Jolley ranchers or Stover's or Life Savers) or even ice chips will also do - the key is to swallow to prevent all throat clearing. NO OIL BASED VITAMINS - use powdered substitutes.   Prednisone 10 mg take  4 each am x 2 days,   2 each am x 2 days,  1 each am x 2 days and stop   We will set you up for a sinus CT  asap add : sinus ct not needed as viz 04/04/17   Please schedule a follow up office visit in 2 weeks, sooner if needed  With your wife  all medications /inhalers/ solutions in hand so we can verify exactly what you are taking. This includes all medications from all doctors and over the counters -

## 2017-06-12 ENCOUNTER — Encounter: Payer: Self-pay | Admitting: Internal Medicine

## 2017-06-12 NOTE — Assessment & Plan Note (Addendum)
Sinuses neg on ct head 04/04/17  D/c acei 1/61/09  - cyclical cough regimen 6/0/4540    Upper airway cough syndrome (previously labeled PNDS),  is so named because it's frequently impossible to sort out how much is  CR/sinusitis with freq throat clearing (which can be related to primary GERD)   vs  causing  secondary (" extra esophageal")  GERD from wide swings in gastric pressure that occur with throat clearing, often  promoting self use of mint and menthol lozenges that reduce the lower esophageal sphincter tone and exacerbate the problem further in a cyclical fashion.   These are the same pts (now being labeled as having "irritable larynx syndrome" by some cough centers) who not infrequently have a history of having failed to tolerate ace inhibitors,  dry powder inhalers or biphosphonates or report having atypical/extraesophageal reflux symptoms that don't respond to standard doses of PPI  and are easily confused as having aecopd or asthma flares by even experienced allergists/ pulmonologists (myself included).  Of the three most common causes of  Sub-acute / recurrent or chronic cough, only one (GERD)  can actually contribute to/ trigger  the other two (asthma and post nasal drip syndrome)  and perpetuate the cylce of cough.  While not intuitively obvious, many patients with chronic low grade reflux do not cough until there is a primary insult that disturbs the protective epithelial barrier and exposes sensitive nerve endings.   This is typically viral but can due to PNDS and  either may apply here.   The point is that once this occurs, it is difficult to eliminate the cycle  using anything but a maximally effective acid suppression regimen at least in the short run, accompanied by an appropriate diet to address non acid GERD and control / eliminate the cough itself for at least 3 days with tyl #3   Return in 2 weeks with all meds in hand using a trust but verify approach to confirm accurate Medication   Reconciliation The principal here is that until we are certain that the  patients are doing what we've asked, it makes no sense to ask them to do more.    I had an extended discussion with the patient reviewing all relevant studies completed to date and  lasting 15 to 20 minutes of a 25 minute visit    Each maintenance medication was reviewed in detail including most importantly the difference between maintenance and prns and under what circumstances the prns are to be triggered using an action plan format that is not reflected in the computer generated alphabetically organized AVS.    Please see AVS for specific instructions unique to this visit that I personally wrote and verbalized to the the pt in detail and then reviewed with pt  by my nurse highlighting any  changes in therapy recommended at today's visit to their plan of care.

## 2017-06-12 NOTE — Assessment & Plan Note (Addendum)
-   PFT's  04/21/17  FEV1 2.13 (68 % ) ratio 68  p 4 % improvement from saba p nothing prior to study with DLCO  40/34 % corrects to 54  % for alv volume   -05/14/2017   try symbicort 80 2 bid   - 06/11/2017  After extensive coaching inhaler device  effectiveness =    90%   Relatively well componensated on low dose symbicort - using it here due to severe cough (see separate a/p)   See device teaching which extended face to face time for this visit

## 2017-06-13 ENCOUNTER — Other Ambulatory Visit: Payer: Self-pay | Admitting: Internal Medicine

## 2017-06-20 ENCOUNTER — Ambulatory Visit
Admission: RE | Admit: 2017-06-20 | Discharge: 2017-06-20 | Disposition: A | Discharge status: Home / Self Care | Payer: BLUE CROSS/BLUE SHIELD | Source: Ambulatory Visit | Attending: Orthopedic Surgery | Admitting: Orthopedic Surgery

## 2017-06-20 DIAGNOSIS — S63512A Sprain of carpal joint of left wrist, initial encounter: Secondary | ICD-10-CM

## 2017-06-20 DIAGNOSIS — M25532 Pain in left wrist: Secondary | ICD-10-CM | POA: Diagnosis not present

## 2017-06-20 MED ORDER — IOPAMIDOL (ISOVUE-M 200) INJECTION 41%
1.2000 mL | Freq: Once | INTRAMUSCULAR | Status: AC
  Administered 2017-06-20: 1.2 mL via INTRA_ARTICULAR

## 2017-06-23 ENCOUNTER — Telehealth: Payer: Self-pay | Admitting: Endocrinology

## 2017-06-23 ENCOUNTER — Telehealth: Payer: Self-pay | Admitting: Internal Medicine

## 2017-06-23 NOTE — Telephone Encounter (Signed)
Patient has been feeling weak and experiencing diahrea. Please call patient at ph# [phone] to advise. Patient has been taking thyroid medication daily.

## 2017-06-23 NOTE — Telephone Encounter (Signed)
Try stopping the methimazole for a few days, to see if the diarrhea gets better.  If it does get better, try resuming to see if the diarrhea recurs.

## 2017-06-23 NOTE — Telephone Encounter (Signed)
Patient is waiting for return call. Ph# [phone] Needs to be called today before he takes thyroid medication due to possible side effects or symptoms.

## 2017-06-23 NOTE — Telephone Encounter (Signed)
Called and spoke to pt. Pt states this morning he developed nausea, diarrhea & weakness.  Pt is concerned that these symptoms are related to methimazole 5mg .  It appears that Dr. Vira Agar prescribed this medication.  I have advised pt to contact Dr. Loanne Drilling regarding this concern.  Nothing further is needed.

## 2017-06-23 NOTE — Telephone Encounter (Signed)
I called & asked patient to d/c methimazole then resume to see if it was the cause if diarrhea. He stated that he would do so.

## 2017-06-25 ENCOUNTER — Ambulatory Visit: Payer: BLUE CROSS/BLUE SHIELD | Admitting: Internal Medicine

## 2017-06-27 ENCOUNTER — Other Ambulatory Visit: Payer: Self-pay | Admitting: Orthopedic Surgery

## 2017-06-27 DIAGNOSIS — M25332 Other instability, left wrist: Secondary | ICD-10-CM | POA: Diagnosis not present

## 2017-07-01 ENCOUNTER — Ambulatory Visit: Payer: BLUE CROSS/BLUE SHIELD | Admitting: Internal Medicine

## 2017-07-08 ENCOUNTER — Other Ambulatory Visit (INDEPENDENT_AMBULATORY_CARE_PROVIDER_SITE_OTHER): Payer: BLUE CROSS/BLUE SHIELD

## 2017-07-08 ENCOUNTER — Encounter: Payer: Self-pay | Admitting: Endocrinology

## 2017-07-08 ENCOUNTER — Ambulatory Visit (INDEPENDENT_AMBULATORY_CARE_PROVIDER_SITE_OTHER): Payer: BLUE CROSS/BLUE SHIELD | Admitting: Endocrinology

## 2017-07-08 VITALS — BP 138/78 | HR 78 | Wt 240.8 lb

## 2017-07-08 DIAGNOSIS — E0590 Thyrotoxicosis, unspecified without thyrotoxic crisis or storm: Secondary | ICD-10-CM

## 2017-07-08 LAB — T4, FREE: Free T4: 0.87 ng/dL (ref 0.60–1.60)

## 2017-07-08 LAB — TSH: TSH: 0.38 u[IU]/mL (ref 0.35–4.50)

## 2017-07-08 NOTE — Patient Instructions (Signed)
blood tests are requested for you today.  We'll let you know about the results. If ever you have fever while taking methimazole, stop it and call us, even if the reason is obvious, because of the risk of a rare side-effect.  Please come back for a follow-up appointment in 3 months.   
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
## 2017-07-11 ENCOUNTER — Ambulatory Visit (INDEPENDENT_AMBULATORY_CARE_PROVIDER_SITE_OTHER): Payer: BLUE CROSS/BLUE SHIELD | Admitting: Internal Medicine

## 2017-07-11 ENCOUNTER — Encounter: Payer: Self-pay | Admitting: Internal Medicine

## 2017-07-11 VITALS — BP 114/78 | HR 78 | Ht 72.0 in | Wt 239.0 lb

## 2017-07-11 DIAGNOSIS — J309 Allergic rhinitis, unspecified: Secondary | ICD-10-CM | POA: Insufficient documentation

## 2017-07-11 DIAGNOSIS — R079 Chest pain, unspecified: Secondary | ICD-10-CM | POA: Diagnosis not present

## 2017-07-11 DIAGNOSIS — K219 Gastro-esophageal reflux disease without esophagitis: Secondary | ICD-10-CM | POA: Insufficient documentation

## 2017-07-11 DIAGNOSIS — J449 Chronic obstructive pulmonary disease, unspecified: Secondary | ICD-10-CM

## 2017-07-11 HISTORY — DX: Gastro-esophageal reflux disease without esophagitis: K21.9
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
## 2017-07-11 NOTE — Assessment & Plan Note (Signed)
Atypical, etiology unclear, possibly related to GI source such as esophageal spasm, consider ntg prn, for o/w same tx, for stress test

## 2017-07-11 NOTE — Patient Instructions (Addendum)
Your EKG was Ok today  OK to take the Protonix for acid in the AM regardless of when you eat  You will be contacted regarding the referral for: Heart Stress test  Please continue all other medications as before, and refills have been done if requested.  Please have the pharmacy call with any other refills you may need.  Please keep your appointments with your specialists as you may have planned

## 2017-07-11 NOTE — Assessment & Plan Note (Signed)
Ok to take the PPI regardless of diet timing, cont pepcid,  to f/u any worsening symptoms or concerns

## 2017-07-11 NOTE — Assessment & Plan Note (Signed)
Mild, to restart the nasacort asd,  to f/u any worsening symptoms or concerns

## 2017-07-11 NOTE — Assessment & Plan Note (Signed)
stable overall by history and exam, and pt to continue medical treatment as before,  to f/u any worsening symptoms or concerns 

## 2017-07-16 ENCOUNTER — Telehealth (HOSPITAL_COMMUNITY): Payer: Self-pay | Admitting: Internal Medicine

## 2017-07-17 ENCOUNTER — Encounter (HOSPITAL_BASED_OUTPATIENT_CLINIC_OR_DEPARTMENT_OTHER): Payer: Self-pay | Admitting: *Deleted

## 2017-07-18 ENCOUNTER — Encounter (HOSPITAL_BASED_OUTPATIENT_CLINIC_OR_DEPARTMENT_OTHER)
Admission: RE | Admit: 2017-07-18 | Discharge: 2017-07-18 | Disposition: A | Discharge status: Home / Self Care | Payer: BLUE CROSS/BLUE SHIELD | Source: Ambulatory Visit | Attending: Orthopedic Surgery | Admitting: Orthopedic Surgery

## 2017-07-18 DIAGNOSIS — Z01812 Encounter for preprocedural laboratory examination: Secondary | ICD-10-CM | POA: Insufficient documentation

## 2017-07-18 LAB — BASIC METABOLIC PANEL
Anion gap: 8 (ref 5–15)
BUN: 15 mg/dL (ref 6–20)
CALCIUM: 9.5 mg/dL (ref 8.9–10.3)
CO2: 25 mmol/L (ref 22–32)
CREATININE: 1.16 mg/dL (ref 0.61–1.24)
Chloride: 106 mmol/L (ref 101–111)
GFR calc Af Amer: >60 mL/min (ref >60)
GFR calc non Af Amer: >60 mL/min (ref >60)
GLUCOSE: 169 mg/dL — AB (ref 65–99)
POTASSIUM: 4.7 mmol/L (ref 3.5–5.1)
Sodium: 139 mmol/L (ref 135–145)

## 2017-07-18 NOTE — Telephone Encounter (Signed)
User: Cherie Dark A Date/time: 07/16/17 3:18 PM  Comment: Called the house 2x and the phone rang busy..  Context:  Outcome: No Answer/Busy  Phone number: [phone] Phone Type: Home Phone  Comm. type: Telephone Call type: Outgoing  Contact: Ray Church A Relation to patient: Self    User: Cherie Dark A Date/time: 07/16/17 3:16 PM  Comment: Called and spoke with patient and he stated that he was still at work and to try calling him house and speaking to his wife.   Context:  Outcome: Left Message  Phone number: [phone] Phone Type: Mobile  Comm. type: Telephone Call type: Outgoing  Contact: Ray Church A Relation to patient: Self

## 2017-07-18 NOTE — Progress Notes (Signed)
Anesthesia consult per Dr. Rodman Comp, pt needs to complete stress test before surgery, notified Elmo Putt at Dr. Glori Bickers office, pt aware of decision.

## 2017-07-22 ENCOUNTER — Ambulatory Visit (HOSPITAL_COMMUNITY)
Admission: RE | Admit: 2017-07-22 | Discharge: 2017-07-22 | Disposition: A | Discharge status: Home / Self Care | Payer: BLUE CROSS/BLUE SHIELD | Source: Ambulatory Visit | Attending: Cardiovascular Disease | Admitting: Cardiovascular Disease

## 2017-07-22 DIAGNOSIS — R079 Chest pain, unspecified: Secondary | ICD-10-CM | POA: Diagnosis not present

## 2017-07-22 LAB — MYOCARDIAL PERFUSION IMAGING
CHL CUP RESTING HR STRESS: 67 {beats}/min
LV sys vol: 58 mL
LVDIAVOL: 136 mL (ref 62–150)
NUC STRESS TID: 0.99
Peak HR: 85 {beats}/min
SDS: 2
SRS: 3
SSS: 5

## 2017-07-22 MED ORDER — TECHNETIUM TC 99M TETROFOSMIN IV KIT
30.1000 | PACK | Freq: Once | INTRAVENOUS | Status: AC | PRN
  Administered 2017-07-22: 30.1 via INTRAVENOUS
  Filled 2017-07-22: qty 31

## 2017-07-22 MED ORDER — TECHNETIUM TC 99M TETROFOSMIN IV KIT
9.9000 | PACK | Freq: Once | INTRAVENOUS | Status: AC | PRN
  Administered 2017-07-22: 9.9 via INTRAVENOUS
  Filled 2017-07-22: qty 10

## 2017-07-22 MED ORDER — REGADENOSON 0.4 MG/5ML IV SOLN
0.4000 mg | Freq: Once | INTRAVENOUS | Status: AC
  Administered 2017-07-22: 0.4 mg via INTRAVENOUS

## 2017-07-24 ENCOUNTER — Ambulatory Visit (HOSPITAL_BASED_OUTPATIENT_CLINIC_OR_DEPARTMENT_OTHER): Payer: BLUE CROSS/BLUE SHIELD | Admitting: Anesthesiology

## 2017-07-24 ENCOUNTER — Encounter (HOSPITAL_COMMUNITY): Payer: BLUE CROSS/BLUE SHIELD

## 2017-07-24 ENCOUNTER — Encounter (HOSPITAL_BASED_OUTPATIENT_CLINIC_OR_DEPARTMENT_OTHER)
Admission: RE | Disposition: A | Discharge status: Home / Self Care | Payer: Self-pay | Source: Ambulatory Visit | Attending: Orthopedic Surgery

## 2017-07-24 ENCOUNTER — Encounter (HOSPITAL_BASED_OUTPATIENT_CLINIC_OR_DEPARTMENT_OTHER): Payer: Self-pay | Admitting: Anesthesiology

## 2017-07-24 ENCOUNTER — Other Ambulatory Visit: Payer: Self-pay

## 2017-07-24 ENCOUNTER — Ambulatory Visit (HOSPITAL_BASED_OUTPATIENT_CLINIC_OR_DEPARTMENT_OTHER)
Admission: RE | Admit: 2017-07-24 | Discharge: 2017-07-24 | Disposition: A | Discharge status: Home / Self Care | Payer: BLUE CROSS/BLUE SHIELD | Source: Ambulatory Visit | Attending: Orthopedic Surgery | Admitting: Orthopedic Surgery

## 2017-07-24 DIAGNOSIS — Z79899 Other long term (current) drug therapy: Secondary | ICD-10-CM | POA: Insufficient documentation

## 2017-07-24 DIAGNOSIS — B1910 Unspecified viral hepatitis B without hepatic coma: Secondary | ICD-10-CM | POA: Insufficient documentation

## 2017-07-24 DIAGNOSIS — X58XXXA Exposure to other specified factors, initial encounter: Secondary | ICD-10-CM | POA: Insufficient documentation

## 2017-07-24 DIAGNOSIS — B1920 Unspecified viral hepatitis C without hepatic coma: Secondary | ICD-10-CM | POA: Diagnosis not present

## 2017-07-24 DIAGNOSIS — Y92009 Unspecified place in unspecified non-institutional (private) residence as the place of occurrence of the external cause: Secondary | ICD-10-CM | POA: Insufficient documentation

## 2017-07-24 DIAGNOSIS — K219 Gastro-esophageal reflux disease without esophagitis: Secondary | ICD-10-CM | POA: Insufficient documentation

## 2017-07-24 DIAGNOSIS — S63591A Other specified sprain of right wrist, initial encounter: Secondary | ICD-10-CM | POA: Diagnosis not present

## 2017-07-24 DIAGNOSIS — I10 Essential (primary) hypertension: Secondary | ICD-10-CM | POA: Insufficient documentation

## 2017-07-24 DIAGNOSIS — J449 Chronic obstructive pulmonary disease, unspecified: Secondary | ICD-10-CM | POA: Insufficient documentation

## 2017-07-24 DIAGNOSIS — Z87891 Personal history of nicotine dependence: Secondary | ICD-10-CM | POA: Insufficient documentation

## 2017-07-24 DIAGNOSIS — M24032 Loose body in left wrist: Secondary | ICD-10-CM | POA: Insufficient documentation

## 2017-07-24 DIAGNOSIS — S63512A Sprain of carpal joint of left wrist, initial encounter: Secondary | ICD-10-CM | POA: Diagnosis not present

## 2017-07-24 DIAGNOSIS — M19132 Post-traumatic osteoarthritis, left wrist: Secondary | ICD-10-CM | POA: Diagnosis not present

## 2017-07-24 DIAGNOSIS — E039 Hypothyroidism, unspecified: Secondary | ICD-10-CM | POA: Diagnosis not present

## 2017-07-24 HISTORY — PX: WRIST ARTHROSCOPY WITH DEBRIDEMENT: SHX6194

## 2017-07-24 LAB — GLUCOSE, CAPILLARY
GLUCOSE-CAPILLARY: 134 mg/dL — AB (ref 65–99)
Glucose-Capillary: 155 mg/dL — ABNORMAL HIGH (ref 65–99)

## 2017-07-24 SURGERY — WRIST ARTHROSCOPY WITH DEBRIDEMENT
Anesthesia: Monitor Anesthesia Care | Site: Wrist | Laterality: Left

## 2017-07-24 MED ORDER — HYDROMORPHONE HCL 1 MG/ML IJ SOLN: 0.2500 mg | INTRAMUSCULAR | Status: DC | PRN

## 2017-07-24 MED ORDER — PROPOFOL 500 MG/50ML IV EMUL
INTRAVENOUS | Status: DC | PRN
Start: 2017-07-24 — End: 2017-07-24
  Administered 2017-07-24: 125 ug/kg/min via INTRAVENOUS

## 2017-07-24 MED ORDER — ONDANSETRON HCL 4 MG/2ML IJ SOLN
INTRAMUSCULAR | Status: DC | PRN
  Administered 2017-07-24: 4 mg via INTRAVENOUS

## 2017-07-24 MED ORDER — FENTANYL CITRATE (PF) 100 MCG/2ML IJ SOLN
INTRAMUSCULAR | Status: AC
Start: 2017-07-24 — End: ?
  Filled 2017-07-24: qty 2

## 2017-07-24 MED ORDER — MIDAZOLAM HCL 2 MG/2ML IJ SOLN
INTRAMUSCULAR | Status: AC
  Filled 2017-07-24: qty 2

## 2017-07-24 MED ORDER — LACTATED RINGERS IV SOLN
INTRAVENOUS | Status: DC
  Administered 2017-07-24: 12:00:00 via INTRAVENOUS

## 2017-07-24 MED ORDER — HYDROCODONE-ACETAMINOPHEN 7.5-325 MG/15ML PO SOLN: 15.0000 mL | Freq: Four times a day (QID) | ORAL | 0 refills | Status: DC | PRN

## 2017-07-24 MED ORDER — HYDROCODONE-ACETAMINOPHEN 7.5-325 MG PO TABS: 1.0000 | ORAL_TABLET | Freq: Once | ORAL | Status: DC | PRN

## 2017-07-24 MED ORDER — MEPERIDINE HCL 25 MG/ML IJ SOLN: 6.2500 mg | INTRAMUSCULAR | Status: DC | PRN

## 2017-07-24 MED ORDER — HYDROCODONE-ACETAMINOPHEN 5-325 MG PO TABS: 1.0000 | ORAL_TABLET | Freq: Four times a day (QID) | ORAL | 0 refills | Status: DC | PRN

## 2017-07-24 MED ORDER — SCOPOLAMINE 1 MG/3DAYS TD PT72: 1.0000 | MEDICATED_PATCH | Freq: Once | TRANSDERMAL | Status: DC | PRN

## 2017-07-24 MED ORDER — CHLORHEXIDINE GLUCONATE 4 % EX LIQD: 60.0000 mL | Freq: Once | CUTANEOUS | Status: DC

## 2017-07-24 MED ORDER — PROPOFOL 10 MG/ML IV BOLUS
INTRAVENOUS | Status: AC
  Filled 2017-07-24: qty 20

## 2017-07-24 MED ORDER — CEFAZOLIN SODIUM-DEXTROSE 2-4 GM/100ML-% IV SOLN
2.0000 g | INTRAVENOUS | Status: AC
  Administered 2017-07-24: 2 g via INTRAVENOUS

## 2017-07-24 MED ORDER — PROPOFOL 10 MG/ML IV BOLUS
INTRAVENOUS | Status: DC | PRN
  Administered 2017-07-24: 20 mg via INTRAVENOUS

## 2017-07-24 MED ORDER — MIDAZOLAM HCL 2 MG/2ML IJ SOLN
1.0000 mg | INTRAMUSCULAR | Status: DC | PRN
  Administered 2017-07-24: 1 mg via INTRAVENOUS
  Administered 2017-07-24: 2 mg via INTRAVENOUS

## 2017-07-24 MED ORDER — FENTANYL CITRATE (PF) 100 MCG/2ML IJ SOLN
50.0000 ug | INTRAMUSCULAR | Status: DC | PRN
  Administered 2017-07-24: 50 ug via INTRAVENOUS
  Administered 2017-07-24: 100 ug via INTRAVENOUS

## 2017-07-24 MED ORDER — PROMETHAZINE HCL 25 MG/ML IJ SOLN: 6.2500 mg | INTRAMUSCULAR | Status: DC | PRN

## 2017-07-24 MED ORDER — CEFAZOLIN SODIUM-DEXTROSE 2-4 GM/100ML-% IV SOLN
INTRAVENOUS | Status: AC
  Filled 2017-07-24: qty 100

## 2017-07-24 MED ORDER — BUPIVACAINE-EPINEPHRINE (PF) 0.5% -1:200000 IJ SOLN
INTRAMUSCULAR | Status: DC | PRN
  Administered 2017-07-24: 30 mL via PERINEURAL

## 2017-07-24 SURGICAL SUPPLY — 73 items
BLADE CUDA 2.0 (BLADE) IMPLANT
BLADE EAR TYMPAN 2.5 60D BEAV (BLADE) IMPLANT
BLADE MINI RND TIP GREEN BEAV (BLADE) IMPLANT
BLADE SURG 15 STRL LF DISP TIS (BLADE) ×1 IMPLANT
BLADE SURG 15 STRL SS (BLADE) ×1
BNDG COHESIVE 3X5 TAN STRL LF (GAUZE/BANDAGES/DRESSINGS) ×2 IMPLANT
BNDG COHESIVE 4X5 TAN STRL (GAUZE/BANDAGES/DRESSINGS) ×2 IMPLANT
BNDG ESMARK 4X9 LF (GAUZE/BANDAGES/DRESSINGS) IMPLANT
BNDG GAUZE ELAST 4 BULKY (GAUZE/BANDAGES/DRESSINGS) ×2 IMPLANT
BUR CUDA 2.9 (BURR) IMPLANT
BUR FULL RADIUS 2.0 (BURR) IMPLANT
BUR FULL RADIUS 2.9 (BURR) ×2 IMPLANT
BUR GATOR 2.9 (BURR) IMPLANT
BUR SPHERICAL 2.9 (BURR) IMPLANT
CANISTER SUCT 1200ML W/VALVE (MISCELLANEOUS) IMPLANT
CHLORAPREP W/TINT 26ML (MISCELLANEOUS) ×2 IMPLANT
CORD BIPOLAR FORCEPS 12FT (ELECTRODE) IMPLANT
COVER BACK TABLE 60X90IN (DRAPES) ×2 IMPLANT
COVER MAYO STAND STRL (DRAPES) ×2 IMPLANT
CUFF TOURNIQUET SINGLE 18IN (TOURNIQUET CUFF) IMPLANT
DRAPE EXTREMITY T 121X128X90 (DRAPE) ×2 IMPLANT
DRAPE IMP U-DRAPE 54X76 (DRAPES) ×2 IMPLANT
DRAPE OEC MINIVIEW 54X84 (DRAPES) IMPLANT
DRAPE SURG 17X23 STRL (DRAPES) ×2 IMPLANT
DRSG PAD ABDOMINAL 8X10 ST (GAUZE/BANDAGES/DRESSINGS) ×2 IMPLANT
ELECT SMALL JOINT 90D BASC (ELECTRODE) IMPLANT
GAUZE SPONGE 4X4 12PLY STRL (GAUZE/BANDAGES/DRESSINGS) ×2 IMPLANT
GAUZE XEROFORM 1X8 LF (GAUZE/BANDAGES/DRESSINGS) ×2 IMPLANT
GLOVE BIOGEL PI IND STRL 8.5 (GLOVE) ×1 IMPLANT
GLOVE BIOGEL PI INDICATOR 8.5 (GLOVE) ×1
GLOVE SURG ORTHO 8.0 STRL STRW (GLOVE) ×2 IMPLANT
GOWN STRL REUS W/ TWL LRG LVL3 (GOWN DISPOSABLE) ×1 IMPLANT
GOWN STRL REUS W/TWL LRG LVL3 (GOWN DISPOSABLE) ×1
GOWN STRL REUS W/TWL XL LVL3 (GOWN DISPOSABLE) ×2 IMPLANT
IV NS IRRIG 3000ML ARTHROMATIC (IV SOLUTION) ×2 IMPLANT
IV SET EXT 30 76VOL 4 MALE LL (IV SETS) ×2 IMPLANT
MANIFOLD NEPTUNE II (INSTRUMENTS) IMPLANT
NDL SAFETY ECLIPSE 18X1.5 (NEEDLE) ×3 IMPLANT
NEEDLE HYPO 18GX1.5 SHARP (NEEDLE) ×3
NEEDLE HYPO 22GX1.5 SAFETY (NEEDLE) ×2 IMPLANT
NEEDLE SPNL 18GX3.5 QUINCKE PK (NEEDLE) IMPLANT
NEEDLE TUOHY 20GX3.5 (NEEDLE) IMPLANT
NS IRRIG 1000ML POUR BTL (IV SOLUTION) IMPLANT
PACK BASIN DAY SURGERY FS (CUSTOM PROCEDURE TRAY) ×2 IMPLANT
PAD CAST 3X4 CTTN HI CHSV (CAST SUPPLIES) ×1 IMPLANT
PADDING CAST ABS 3INX4YD NS (CAST SUPPLIES) ×1
PADDING CAST ABS 4INX4YD NS (CAST SUPPLIES) ×1
PADDING CAST ABS COTTON 3X4 (CAST SUPPLIES) ×1 IMPLANT
PADDING CAST ABS COTTON 4X4 ST (CAST SUPPLIES) ×1 IMPLANT
PADDING CAST COTTON 3X4 STRL (CAST SUPPLIES) ×1
ROUTER HOODED VORTEX 2.9MM (BLADE) IMPLANT
SET SM JOINT TUBING/CANN (CANNULA) IMPLANT
SLEEVE SCD COMPRESS KNEE MED (MISCELLANEOUS) IMPLANT
SLING ARM FOAM STRAP XLG (SOFTGOODS) ×2 IMPLANT
SPLINT PLASTER CAST XFAST 3X15 (CAST SUPPLIES) IMPLANT
SPLINT PLASTER XTRA FASTSET 3X (CAST SUPPLIES)
STOCKINETTE 4X48 STRL (DRAPES) ×2 IMPLANT
SUCTION FRAZIER HANDLE 10FR (MISCELLANEOUS)
SUCTION TUBE FRAZIER 10FR DISP (MISCELLANEOUS) IMPLANT
SUT ETHILON 4 0 PS 2 18 (SUTURE) ×2 IMPLANT
SUT MERSILENE 4 0 P 3 (SUTURE) IMPLANT
SUT PDS AB 2-0 CT2 27 (SUTURE) IMPLANT
SUT STEEL 4 0 (SUTURE) IMPLANT
SUT VIC AB 2-0 PS2 27 (SUTURE) IMPLANT
SUT VICRYL 4-0 PS2 18IN ABS (SUTURE) IMPLANT
SYR BULB 3OZ (MISCELLANEOUS) ×2 IMPLANT
SYR CONTROL 10ML LL (SYRINGE) ×2 IMPLANT
TOWEL OR NON WOVEN STRL DISP B (DISPOSABLE) ×2 IMPLANT
TUBE CONNECTING 20X1/4 (TUBING) IMPLANT
TUBING ARTHRO INFLOW-ONLY STRL (TUBING) IMPLANT
UNDERPAD 30X30 (UNDERPADS AND DIAPERS) ×2 IMPLANT
WAND SHORT BEVEL W/CORD (SURGICAL WAND) IMPLANT
WATER STERILE IRR 1000ML POUR (IV SOLUTION) ×2 IMPLANT

## 2017-07-24 NOTE — Anesthesia Preprocedure Evaluation (Addendum)
Anesthesia Evaluation  Patient identified by MRN, date of birth, ID band Patient awake    Reviewed: Allergy & Precautions, NPO status , Patient's Chart, lab work & pertinent test results  Airway Mallampati: II  TM Distance: >3 FB Neck ROM: Full    Dental  (+) Poor Dentition, Loose, Missing,    Pulmonary COPD,  COPD inhaler, former smoker,    Pulmonary exam normal breath sounds clear to auscultation       Cardiovascular hypertension, Pt. on medications Normal cardiovascular exam Rhythm:Regular Rate:Normal  Myocardial perfusion scan 07/22/2017 1. EF 57%, normal wall motion.  2. Fixed small, mild basal inferior perfusion defect.  Given normal wall motion, suspect diaphragmatic attenuation.  No ischemia.   Low risk study.    Neuro/Psych negative neurological ROS  negative psych ROS   GI/Hepatic GERD  Medicated and Controlled,(+) Hepatitis -, B, C  Endo/Other  Hyperthyroidism   Renal/GU negative Renal ROS  negative genitourinary   Musculoskeletal  (+) Arthritis , Osteoarthritis,  Scapulolunate dissociation of left wrist   Abdominal (+) + obese,   Peds  Hematology negative hematology ROS (+)   Anesthesia Other Findings   Reproductive/Obstetrics                           Anesthesia Physical Anesthesia Plan  ASA: III  Anesthesia Plan: Regional and MAC   Post-op Pain Management:    Induction: Intravenous  PONV Risk Score and Plan: 1 and Propofol infusion, Ondansetron and Treatment may vary due to age or medical condition  Airway Management Planned: Natural Airway, Nasal Cannula and Simple Face Mask  Additional Equipment:   Intra-op Plan:   Post-operative Plan: Extubation in OR  Informed Consent: I have reviewed the patients History and Physical, chart, labs and discussed the procedure including the risks, benefits and alternatives for the proposed anesthesia with the patient or  authorized representative who has indicated his/her understanding and acceptance.   Dental advisory given  Plan Discussed with: CRNA and Surgeon  Anesthesia Plan Comments:         Anesthesia Quick Evaluation

## 2017-07-24 NOTE — Progress Notes (Signed)
This patient has screened at an elevated risk for Obstructive Sleep Apnea using the STOP-Bang tool during a presurgical visit. A score of 4 or greater is an elevated risk.

## 2017-07-24 NOTE — Progress Notes (Signed)
Assisted Dr. Foster with left, ultrasound guided, axillary block. Side rails up, monitors on throughout procedure. See vital signs in flow sheet. Tolerated Procedure well. 

## 2017-07-24 NOTE — Transfer of Care (Signed)
Immediate Anesthesia Transfer of Care Note  Patient: Gabriel Coleman  Procedure(s) Performed: LEFT WRIST ARTHROSCOPY WITH DEBRIDEMENT (Left Wrist)  Patient Location: PACU  Anesthesia Type:MAC and MAC combined with regional for post-op pain  Level of Consciousness: sedated  Airway & Oxygen Therapy: Patient Spontanous Breathing and Patient connected to face mask oxygen  Post-op Assessment: Report given to RN and Post -op Vital signs reviewed and stable  Post vital signs: Reviewed and stable  Last Vitals:  Vitals Value Taken Time  BP    Temp    Pulse 87 07/24/2017  2:15 PM  Resp    SpO2 93 % 07/24/2017  2:15 PM  Vitals shown include unvalidated device data.  Last Pain:  Vitals:   07/24/17 1145  TempSrc: Oral  PainSc: 2       Patients Stated Pain Goal: 1 (81/44/81 8563)  Complications: No apparent anesthesia complications
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
## 2017-07-24 NOTE — Discharge Instructions (Addendum)

## 2017-07-24 NOTE — Brief Op Note (Signed)
07/24/2017  2:21 PM  PATIENT:  Gabriel Coleman  62 y.o. male  PRE-OPERATIVE DIAGNOSIS:  SCAPHOLUNATE DISSOCIATION LEFT WRIST  POST-OPERATIVE DIAGNOSIS:  SCAPHOLUNATE DISSOCIATION LEFT WRIST  PROCEDURE:  Procedure(s): LEFT WRIST ARTHROSCOPY WITH DEBRIDEMENT (Left)  SURGEON:  Surgeon(s) and Role:    Daryll Brod, MD - Primary  PHYSICIAN ASSISTANT:   ASSISTANTS: none   ANESTHESIA:   regional  EBL:  5 mL   BLOOD ADMINISTERED:none  DRAINS: none   LOCAL MEDICATIONS USED:  NONE  SPECIMEN:  No Specimen  DISPOSITION OF SPECIMEN:  N/A  COUNTS:  YES  TOURNIQUET:  * No tourniquets in log *  DICTATION: .Dragon Dictation  PLAN OF CARE: Discharge to home after PACU  PATIENT DISPOSITION:  PACU - hemodynamically stable.

## 2017-07-24 NOTE — Op Note (Signed)
Preoperative diagnosis: Scapholunate ligament dissociation left wrist  Postoperative diagnosis: Same  Operation: Arthroscopy with debridement loose body left wrist  Surgeon: Daryll Brod  Assistant: None  Anesthesia axillary block with IV sedation  [facility] of surgery[facility]  History: The patient is a 62 year old male with a history of wrist pain.  He has a scapholunate dissociation on x-ray.  MRI reveals a scapholunate dissociation with a loose body and probable arthritic changes at the radial scaphoid joint.  This is not responded to conservative treatment is elected to undergo arthroscopy with debridement removal of loose body.  Pre-peri-postoperative course are discussed along with risk applications.  He is aware there is no guarantee to the surgery the possibility of infection recurrence injury to arteries nerves tendons incomplete relief of symptoms and dystrophy.  In the preoperative area the patient is seen extremity marked by both patient and surgeon antibiotic given and a block was given under the anesthesia department.  Procedure: The patient is brought to the operating room falling a adequate block.  He was prepped and draped in supine position with the left arm free.  A three-minute dry time was allowed timeout taken to confirm patient procedure.  The left arm was placed in the arc arthroscopy Tarbert.  10 pounds of traction was applied.  The 3-4 portal was opened after inflation of the joint localization with a 22-gauge needle.  Transverse incision was made deepened with a hemostat blunt trocar was used to enter the joint joint was inspected significant degenerative changes with a very significant synovitis was present on the radial aspect of the wrist.  Is a complete scapholunate ligament tear was present.  The volar radial ligaments were intact.  The proximal lunate showed no significant changes.  The TFCC was intact.  Irrigation catheter was placed in situ.  Lunotriquetral  joint appeared intact.  A 4-5 portal was opened.  The radial side of the wrist was then debrided with a full-radius shaver.  A large partially detached loose body was immediately encountered.  This was removed with some difficulty after decreasing in size with the full-radius shaver and removed in piecemeal with basket forceps.  The scapholunate ligament was partially debrided.  Significant degenerative changes were present on the proximal aspect of the scaphoid and the radial scaphoid facet.  A considerable period of time was necessary for removal of the loose body.  The  joint was copiously irrigated with salin and irrigated clear.  The instruments were removed.  The portals were closed with interrupted 4 nylon sutures.  A sterile compressive dressing was applied.  Tourniquet was not inflated although it was placed.  The limb was removed from the arthroscopy tower.  He was taken to the recovery room for observation in satisfactory condition.  He will be discharged home to [facility] in 1 week on Norco.

## 2017-07-24 NOTE — Anesthesia Procedure Notes (Signed)
Anesthesia Regional Block: Axillary brachial plexus block   Pre-Anesthetic Checklist: ,, timeout performed, Correct Patient, Correct Site, Correct Laterality, Correct Procedure, Correct Position, site marked, Risks and benefits discussed,  Surgical consent,  Pre-op evaluation,  At surgeon's request and post-op pain management  Laterality: Left  Prep: chloraprep       Needles:  Injection technique: Single-shot  Needle Type: Echogenic Stimulator Needle     Needle Length: 9cm  Needle Gauge: 21   Needle insertion depth: 6 cm   Additional Needles:   Motor weakness within 5 minutes.  Narrative:  Start time: 07/24/2017 12:10 PM End time: 07/24/2017 12:15 PM Injection made incrementally with aspirations every 5 mL.  Performed by: Personally  Anesthesiologist: Josephine Igo, MD  Additional Notes: Timeout performed. Patient sedated. Relevant anatomy ID'd using [name]. Incremental 2-60ml injection of LA with frequent aspiration. Patient tolerated procedure well.        Left Axillary Block

## 2017-07-24 NOTE — Anesthesia Postprocedure Evaluation (Signed)
Anesthesia Post Note  Patient: Gabriel Coleman  Procedure(s) Performed: LEFT WRIST ARTHROSCOPY WITH DEBRIDEMENT (Left Wrist)     Patient location during evaluation: PACU Anesthesia Type: Regional and MAC Level of consciousness: awake and alert and oriented Pain management: pain level controlled Vital Signs Assessment: post-procedure vital signs reviewed and stable Respiratory status: spontaneous breathing, nonlabored ventilation and respiratory function stable Cardiovascular status: stable and blood pressure returned to baseline Postop Assessment: no apparent nausea or vomiting Anesthetic complications: no    Last Vitals:  Vitals:   07/24/17 1430 07/24/17 1445  BP: (!) 138/92 (!) 132/96  Pulse: 81 74  Resp: 15 16  Temp:    SpO2: 92% 94%    Last Pain:  Vitals:   07/24/17 1445  TempSrc:   PainSc: 0-No pain                 Breasia Karges A.

## 2017-07-25 ENCOUNTER — Encounter (HOSPITAL_BASED_OUTPATIENT_CLINIC_OR_DEPARTMENT_OTHER): Payer: Self-pay | Admitting: Orthopedic Surgery

## 2017-07-30 DIAGNOSIS — M25332 Other instability, left wrist: Secondary | ICD-10-CM | POA: Insufficient documentation

## 2017-08-12 DIAGNOSIS — M25532 Pain in left wrist: Secondary | ICD-10-CM | POA: Diagnosis not present

## 2017-08-12 DIAGNOSIS — M25332 Other instability, left wrist: Secondary | ICD-10-CM | POA: Diagnosis not present

## 2017-08-12 DIAGNOSIS — M25632 Stiffness of left wrist, not elsewhere classified: Secondary | ICD-10-CM | POA: Diagnosis not present

## 2017-08-15 DIAGNOSIS — M25532 Pain in left wrist: Secondary | ICD-10-CM | POA: Diagnosis not present

## 2017-08-15 DIAGNOSIS — M25632 Stiffness of left wrist, not elsewhere classified: Secondary | ICD-10-CM | POA: Diagnosis not present

## 2017-08-18 DIAGNOSIS — M25632 Stiffness of left wrist, not elsewhere classified: Secondary | ICD-10-CM | POA: Diagnosis not present

## 2017-08-18 DIAGNOSIS — M25532 Pain in left wrist: Secondary | ICD-10-CM | POA: Diagnosis not present

## 2017-08-22 ENCOUNTER — Ambulatory Visit (INDEPENDENT_AMBULATORY_CARE_PROVIDER_SITE_OTHER): Payer: BLUE CROSS/BLUE SHIELD | Admitting: Neurology

## 2017-08-22 ENCOUNTER — Encounter: Payer: Self-pay | Admitting: Neurology

## 2017-08-22 VITALS — BP 130/88 | HR 82 | Ht 72.0 in | Wt 236.2 lb

## 2017-08-22 DIAGNOSIS — R292 Abnormal reflex: Secondary | ICD-10-CM

## 2017-08-22 DIAGNOSIS — R202 Paresthesia of skin: Secondary | ICD-10-CM

## 2017-08-22 NOTE — Patient Instructions (Addendum)
Increase gabapentin to 300mg  in the morning and at bedtime x 1 week, then increase to 300mg  three times daily.  Make dose changes on your day off so you can determine that you are tolerating the medication okay.  MRI lumbar without spine  We will call you with the results
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
## 2017-08-26 DIAGNOSIS — M25532 Pain in left wrist: Secondary | ICD-10-CM | POA: Diagnosis not present

## 2017-08-26 DIAGNOSIS — M25332 Other instability, left wrist: Secondary | ICD-10-CM | POA: Diagnosis not present

## 2017-08-26 DIAGNOSIS — M25632 Stiffness of left wrist, not elsewhere classified: Secondary | ICD-10-CM | POA: Diagnosis not present

## 2017-09-01 DIAGNOSIS — M25332 Other instability, left wrist: Secondary | ICD-10-CM | POA: Diagnosis not present

## 2017-09-05 ENCOUNTER — Ambulatory Visit: Payer: BLUE CROSS/BLUE SHIELD | Admitting: Neurology

## 2017-09-11 ENCOUNTER — Other Ambulatory Visit: Payer: Self-pay | Admitting: Orthopedic Surgery

## 2017-09-12 ENCOUNTER — Other Ambulatory Visit: Payer: Self-pay | Admitting: Internal Medicine

## 2017-09-12 DIAGNOSIS — R05 Cough: Secondary | ICD-10-CM

## 2017-09-12 DIAGNOSIS — R058 Other specified cough: Secondary | ICD-10-CM

## 2017-09-15 ENCOUNTER — Telehealth: Payer: Self-pay | Admitting: Internal Medicine

## 2017-09-15 NOTE — Telephone Encounter (Signed)
Documents have been faxed again.

## 2017-09-15 NOTE — Telephone Encounter (Signed)
Copied from [place] [id number]. Topic: Referral - Question >> Sep 15, 2017  9:00 AM Blase Mess A wrote: Reason for CRM: Alycia from [facility] called stating that she received a fax back from Dr Jenny Reichmann for surgery clearance.  And everything was black but his signature and date.  She is requesting for it to be sent again. Fax number [phone]

## 2017-09-22 ENCOUNTER — Ambulatory Visit (INDEPENDENT_AMBULATORY_CARE_PROVIDER_SITE_OTHER): Payer: BLUE CROSS/BLUE SHIELD | Admitting: Internal Medicine

## 2017-09-22 ENCOUNTER — Encounter: Payer: Self-pay | Admitting: Internal Medicine

## 2017-09-22 VITALS — BP 120/84 | HR 92 | Temp 99.1°F | Ht 72.0 in | Wt 219.0 lb

## 2017-09-22 DIAGNOSIS — K5900 Constipation, unspecified: Secondary | ICD-10-CM

## 2017-09-22 DIAGNOSIS — H538 Other visual disturbances: Secondary | ICD-10-CM

## 2017-09-22 DIAGNOSIS — I10 Essential (primary) hypertension: Secondary | ICD-10-CM

## 2017-09-22 DIAGNOSIS — E119 Type 2 diabetes mellitus without complications: Secondary | ICD-10-CM | POA: Diagnosis not present

## 2017-09-22 DIAGNOSIS — E559 Vitamin D deficiency, unspecified: Secondary | ICD-10-CM | POA: Diagnosis not present

## 2017-09-22 NOTE — Assessment & Plan Note (Signed)
To continue losartan

## 2017-09-22 NOTE — Assessment & Plan Note (Signed)
With recent wt loss not necessarily intentional, suspect due to uncontrolled DM, for metfomrin start, f/u lab as ordered

## 2017-09-22 NOTE — Assessment & Plan Note (Signed)
[place] for lab f/u,  to f/u any worsening symptoms or concerns

## 2017-09-22 NOTE — Assessment & Plan Note (Signed)
Suspect may be due to lower volume with uncontrolled DM, for miralax or senakot and better DM control

## 2017-09-22 NOTE — Assessment & Plan Note (Signed)
Suspect related to uncontrolled DM, for DM tx and refer optho
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
## 2017-09-22 NOTE — Patient Instructions (Addendum)
OK to try Miralax OTC or Senakot for constipation  OK to take also the Colace 100 mg up to twice per day for stool softner  Please continue all other medications as before, and refills have been done if requested - the meclizine  Please have the pharmacy call with any other refills you may need.  Please continue your efforts at being more active, low cholesterol diet, and weight control.  Please keep your appointments with your specialists as you may have planned  Please return in 6 weeks (around Sept 30) for LAB work only - which is about 6 weeks after you are taking all the medication  You will be contacted regarding the referral for: Diabetes education classes, and Eye doctor  Please return in 6 months, or sooner if needed, with Lab testing done 3-5 days before

## 2017-09-29 ENCOUNTER — Other Ambulatory Visit: Payer: Self-pay

## 2017-09-29 ENCOUNTER — Encounter (HOSPITAL_BASED_OUTPATIENT_CLINIC_OR_DEPARTMENT_OTHER): Payer: Self-pay | Admitting: *Deleted

## 2017-09-29 NOTE — Progress Notes (Signed)
   09/29/17 1256  OBSTRUCTIVE SLEEP APNEA  Have you ever been diagnosed with sleep apnea through a sleep study? No  Do you snore loudly (loud enough to be heard through closed doors)?  0  Do you often feel tired, fatigued, or sleepy during the daytime (such as falling asleep during driving or talking to someone)? 1  Has anyone observed you stop breathing during your sleep? 0  Do you have, or are you being treated for high blood pressure? 1  BMI more than 35 kg/m2? 0  Age > 50 (1-yes) 1  Neck circumference greater than:Male 16 inches or larger, Male 17inches or larger? 0  Male Gender (Yes=1) 1  Obstructive Sleep Apnea Score 4

## 2017-09-30 ENCOUNTER — Encounter (HOSPITAL_BASED_OUTPATIENT_CLINIC_OR_DEPARTMENT_OTHER)
Admission: RE | Admit: 2017-09-30 | Discharge: 2017-09-30 | Disposition: A | Discharge status: Home / Self Care | Payer: BLUE CROSS/BLUE SHIELD | Source: Ambulatory Visit | Attending: Orthopedic Surgery | Admitting: Orthopedic Surgery

## 2017-09-30 DIAGNOSIS — H538 Other visual disturbances: Secondary | ICD-10-CM | POA: Insufficient documentation

## 2017-09-30 DIAGNOSIS — E119 Type 2 diabetes mellitus without complications: Secondary | ICD-10-CM | POA: Diagnosis not present

## 2017-09-30 DIAGNOSIS — Z5309 Procedure and treatment not carried out because of other contraindication: Secondary | ICD-10-CM | POA: Diagnosis not present

## 2017-09-30 LAB — BASIC METABOLIC PANEL
Anion gap: 12 (ref 5–15)
BUN: 11 mg/dL (ref 8–23)
CALCIUM: 9.5 mg/dL (ref 8.9–10.3)
CO2: 21 mmol/L — ABNORMAL LOW (ref 22–32)
Chloride: 97 mmol/L — ABNORMAL LOW (ref 98–111)
Creatinine, Ser: 1 mg/dL (ref 0.61–1.24)
GFR calc Af Amer: >60 mL/min (ref >60)
Glucose, Bld: 438 mg/dL — ABNORMAL HIGH (ref 70–99)
Potassium: 4.9 mmol/L (ref 3.5–5.1)
SODIUM: 130 mmol/L — AB (ref 135–145)

## 2017-10-01 ENCOUNTER — Encounter (HOSPITAL_COMMUNITY): Payer: Self-pay | Admitting: Anesthesiology

## 2017-10-01 NOTE — Progress Notes (Addendum)
Lab results reviewed by Dr. Gifford Shave,( glucose=438,) will reevaluate day of surgery, attempted to notify pt, no answer, not able to leave message.

## 2017-10-02 ENCOUNTER — Ambulatory Visit (HOSPITAL_BASED_OUTPATIENT_CLINIC_OR_DEPARTMENT_OTHER)
Admission: RE | Admit: 2017-10-02 | Discharge: 2017-10-02 | Disposition: A | Discharge status: Home / Self Care | Payer: BLUE CROSS/BLUE SHIELD | Source: Ambulatory Visit | Attending: Orthopedic Surgery | Admitting: Orthopedic Surgery

## 2017-10-02 ENCOUNTER — Other Ambulatory Visit (INDEPENDENT_AMBULATORY_CARE_PROVIDER_SITE_OTHER): Payer: BLUE CROSS/BLUE SHIELD

## 2017-10-02 ENCOUNTER — Other Ambulatory Visit: Payer: Self-pay

## 2017-10-02 ENCOUNTER — Encounter (HOSPITAL_BASED_OUTPATIENT_CLINIC_OR_DEPARTMENT_OTHER): Payer: Self-pay

## 2017-10-02 ENCOUNTER — Encounter (HOSPITAL_BASED_OUTPATIENT_CLINIC_OR_DEPARTMENT_OTHER)
Admission: RE | Disposition: A | Discharge status: Home / Self Care | Payer: Self-pay | Source: Ambulatory Visit | Attending: Orthopedic Surgery

## 2017-10-02 ENCOUNTER — Ambulatory Visit (INDEPENDENT_AMBULATORY_CARE_PROVIDER_SITE_OTHER): Payer: BLUE CROSS/BLUE SHIELD | Admitting: Internal Medicine

## 2017-10-02 ENCOUNTER — Other Ambulatory Visit: Payer: Self-pay | Admitting: Internal Medicine

## 2017-10-02 ENCOUNTER — Encounter: Payer: Self-pay | Admitting: Internal Medicine

## 2017-10-02 VITALS — BP 124/84 | HR 83 | Temp 98.4°F | Ht 72.0 in | Wt 221.0 lb

## 2017-10-02 DIAGNOSIS — J449 Chronic obstructive pulmonary disease, unspecified: Secondary | ICD-10-CM | POA: Diagnosis not present

## 2017-10-02 DIAGNOSIS — E119 Type 2 diabetes mellitus without complications: Secondary | ICD-10-CM

## 2017-10-02 DIAGNOSIS — H538 Other visual disturbances: Secondary | ICD-10-CM

## 2017-10-02 DIAGNOSIS — Z5309 Procedure and treatment not carried out because of other contraindication: Secondary | ICD-10-CM | POA: Diagnosis not present

## 2017-10-02 DIAGNOSIS — I10 Essential (primary) hypertension: Secondary | ICD-10-CM | POA: Diagnosis not present

## 2017-10-02 DIAGNOSIS — E559 Vitamin D deficiency, unspecified: Secondary | ICD-10-CM | POA: Diagnosis not present

## 2017-10-02 HISTORY — DX: Type 2 diabetes mellitus without complications: E11.9

## 2017-10-02 HISTORY — DX: Anxiety disorder, unspecified: F41.9

## 2017-10-02 LAB — GLUCOSE, CAPILLARY: Glucose-Capillary: 403 mg/dL — ABNORMAL HIGH (ref 70–99)

## 2017-10-02 LAB — HEMOGLOBIN A1C: Hgb A1c MFr Bld: 16.6 % — ABNORMAL HIGH (ref 4.6–6.5)

## 2017-10-02 SURGERY — CARPECTOMY
Anesthesia: Regional | Laterality: Left

## 2017-10-02 MED ORDER — LANCETS MISC: 3 refills | Status: DC

## 2017-10-02 MED ORDER — ONETOUCH ULTRA 2 W/DEVICE KIT: PACK | 0 refills | Status: DC

## 2017-10-02 MED ORDER — SCOPOLAMINE 1 MG/3DAYS TD PT72: 1.0000 | MEDICATED_PATCH | Freq: Once | TRANSDERMAL | Status: DC | PRN

## 2017-10-02 MED ORDER — MIDAZOLAM HCL 2 MG/2ML IJ SOLN: 1.0000 mg | INTRAMUSCULAR | Status: DC | PRN

## 2017-10-02 MED ORDER — CEFAZOLIN SODIUM-DEXTROSE 2-4 GM/100ML-% IV SOLN
INTRAVENOUS | Status: AC
  Filled 2017-10-02: qty 100

## 2017-10-02 MED ORDER — CEFAZOLIN SODIUM-DEXTROSE 2-4 GM/100ML-% IV SOLN: 2.0000 g | INTRAVENOUS | Status: DC

## 2017-10-02 MED ORDER — MIDAZOLAM HCL 2 MG/2ML IJ SOLN
INTRAMUSCULAR | Status: AC
  Filled 2017-10-02: qty 2

## 2017-10-02 MED ORDER — CHLORHEXIDINE GLUCONATE 4 % EX LIQD: 60.0000 mL | Freq: Once | CUTANEOUS | Status: DC

## 2017-10-02 MED ORDER — FENTANYL CITRATE (PF) 100 MCG/2ML IJ SOLN
INTRAMUSCULAR | Status: AC
  Filled 2017-10-02: qty 2

## 2017-10-02 MED ORDER — FENTANYL CITRATE (PF) 100 MCG/2ML IJ SOLN: 50.0000 ug | INTRAMUSCULAR | Status: DC | PRN

## 2017-10-02 MED ORDER — METFORMIN HCL ER 500 MG PO TB24: 2000.0000 mg | ORAL_TABLET | Freq: Every day | ORAL | 3 refills | Status: DC

## 2017-10-02 MED ORDER — GLUCOSE BLOOD VI STRP: ORAL_STRIP | 12 refills | Status: DC

## 2017-10-02 MED ORDER — LACTATED RINGERS IV SOLN: INTRAVENOUS | Status: DC

## 2017-10-02 NOTE — Progress Notes (Signed)
Pt sugar is 403 today when checked. Dr Gifford Shave spoke with Dr Fredna Dow and surgery cancelled at this time. Dr Fredna Dow spoke with pt. About POC. Pt verbalized understanding
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
## 2017-10-02 NOTE — Patient Instructions (Addendum)
Your prescription for the glucometer was sent to the pharmacy; please ask them to call with the name of another meter if this is not covered with your insurance  Please check your sugars 4 times per day for the next 4 days and call with results on Tuesday  If the sugars are reasonable (less than 200) we can advise Dr Fredna Dow that Eugenio Saenz rescheduling would be ok  Then you can change to once per day after that  Please continue all other medications as before, and refills have been done if requested.  Please have the pharmacy call with any other refills you may need.  Please continue your efforts at being more active, low cholesterol diet, and weight control.  Please keep your appointments with your specialists as you may have planned  Please go to the LAB in the Basement (turn left off the elevator) for the tests to be done today  You will be contacted by phone if any changes need to be made immediately.  Otherwise, you will receive a letter about your results with an explanation, but please check with MyChart first.  Please remember to sign up for MyChart if you have not done so, as this will be important to you in the future with finding out test results, communicating by private email, and scheduling acute appointments online when needed.

## 2017-10-03 ENCOUNTER — Ambulatory Visit: Payer: BLUE CROSS/BLUE SHIELD | Admitting: *Deleted

## 2017-10-03 ENCOUNTER — Telehealth: Payer: Self-pay | Admitting: Internal Medicine

## 2017-10-03 ENCOUNTER — Telehealth: Payer: Self-pay

## 2017-10-03 DIAGNOSIS — E119 Type 2 diabetes mellitus without complications: Secondary | ICD-10-CM

## 2017-10-03 LAB — HEPATIC FUNCTION PANEL
ALBUMIN: 4.4 g/dL (ref 3.5–5.2)
ALT: 23 U/L (ref 0–53)
AST: 19 U/L (ref 0–37)
Alkaline Phosphatase: 70 U/L (ref 39–117)
Bilirubin, Direct: 0.1 mg/dL (ref 0.0–0.3)
TOTAL PROTEIN: 8.1 g/dL (ref 6.0–8.3)
Total Bilirubin: 0.5 mg/dL (ref 0.2–1.2)

## 2017-10-03 LAB — BASIC METABOLIC PANEL
BUN: 16 mg/dL (ref 6–23)
CO2: 20 mEq/L (ref 19–32)
Calcium: 9.9 mg/dL (ref 8.4–10.5)
Chloride: 92 mEq/L — ABNORMAL LOW (ref 96–112)
Creatinine, Ser: 1.22 mg/dL (ref 0.40–1.50)
GFR: 77.49 mL/min (ref >60.00)
GLUCOSE: 563 mg/dL — AB (ref 70–99)
POTASSIUM: 4.6 meq/L (ref 3.5–5.1)
SODIUM: 126 meq/L — AB (ref 135–145)

## 2017-10-03 LAB — LIPID PANEL
CHOLESTEROL: 193 mg/dL (ref 0–200)
HDL: 37.7 mg/dL — AB (ref >39.00)
Total CHOL/HDL Ratio: 5

## 2017-10-03 LAB — LDL CHOLESTEROL, DIRECT: Direct LDL: 122 mg/dL

## 2017-10-03 LAB — VITAMIN D 25 HYDROXY (VIT D DEFICIENCY, FRACTURES): VITD: 37.99 ng/mL (ref 30.00–100.00)

## 2017-10-03 NOTE — Progress Notes (Signed)
I instructed patient and his wife on how to use his glucometer and to check his CBGs, keep a log and report his readings to Dr. Jenny Reichmann on 10/07/17. We also talked about how cutting out carbs, sugars, starches and limiting his portions will help. He is aware that he will be getting a call about scheduling with an appointment with Nutrition and diabetic educator.

## 2017-10-03 NOTE — Telephone Encounter (Signed)
CRITICAL VALUE STICKER  CRITICAL VALUE:Glucose of [place] (on-site recipient of call): Almyra Free  DATE & TIME NOTIFIED: 8/30 at 10:36am  MESSENGER (representative from lab):Sy  MD NOTIFIED: Jenny Reichmann  TIME OF NOTIFICATION:10:37am  RESPONSE: Will follow up

## 2017-10-03 NOTE — Telephone Encounter (Signed)
-----   Message from Biagio Borg, MD sent at 10/02/2017  8:24 PM EDT ----- Left message on MyChart, pt to cont same tx except  The test results show that your current treatment is OK, except the A1c test is VERY HIGH, and overall Much Worse than your last a1c of 7.5 about 6 months ago.  This is most likely due to not really watching your diet.  We need to increase the metformin to 4 pills per day, and call if you have diarrhea or other stomach upset with this.  Please call next Tuesday with your sugar results as well.  We also will need to refer you to Diabetes Education as the diet is so important to controlling the sugar and avoiding complications in the future.  I will send the prescription, and you should hear from the office as well.Gabriel Coleman to please inform pt, I will do referral and rx

## 2017-10-03 NOTE — Telephone Encounter (Signed)
Pt has been informed of results and expressed understanding.  °

## 2017-10-04 NOTE — Assessment & Plan Note (Signed)
Uncontrolled, for a1c with labs today, to start check cbgs qid with glucometer and strips, to call with cbg's by sept 3, may need further OHA or insulin

## 2017-10-04 NOTE — Assessment & Plan Note (Signed)
stable overall by history and exam, and pt to continue medical treatment as before,  to f/u any worsening symptoms or concerns 

## 2017-10-04 NOTE — Assessment & Plan Note (Signed)
stable overall by history and exam, recent data reviewed with pt, and pt to continue medical treatment as before,  to f/u any worsening symptoms or concerns  

## 2017-10-07 ENCOUNTER — Telehealth: Payer: Self-pay | Admitting: Internal Medicine

## 2017-10-07 MED ORDER — EMPAGLIFLOZIN 25 MG PO TABS: 25.0000 mg | ORAL_TABLET | Freq: Every day | ORAL | 3 refills | Status: DC

## 2017-10-07 NOTE — Telephone Encounter (Signed)
Copied from [place] [id number]. Topic: Quick Communication - See Telephone Encounter >> Oct 07, 2017  2:00 PM Ivar Drape wrote: CRM for notification. See Telephone encounter for: 10/07/17. Patient's sugar levels are as follows:  Fri. 10/03/17 - 348  Sat. 10/04/17 - 12noon - 327, 3:00pm - 543, 7:00pm - 373, 10:30pm - 447  Sun. 10/05/17 - 12noon - 305, 3:30pm - 500, After eating 10:00pm - 314  Mon. 10/06/17 - 12:15pm - 282, 4:00pm - 294, 8:15pm - 291, After Eating 12:26midnight - 326  Tues. 93/19 - 11:00am - 256

## 2017-10-07 NOTE — Telephone Encounter (Signed)
Sugars are too high  Ok to add jardiance 25 mg daily  Pt should have pharmacy call with covered alternative if this is not covered

## 2017-10-07 NOTE — Telephone Encounter (Signed)
Pt has been informed.

## 2017-10-07 NOTE — Addendum Note (Signed)
Addended by: Biagio Borg on: 10/07/2017 03:17 PM   Modules accepted: Orders

## 2017-10-08 ENCOUNTER — Ambulatory Visit (INDEPENDENT_AMBULATORY_CARE_PROVIDER_SITE_OTHER): Payer: BLUE CROSS/BLUE SHIELD | Admitting: Endocrinology

## 2017-10-08 ENCOUNTER — Encounter: Payer: Self-pay | Admitting: Internal Medicine

## 2017-10-08 VITALS — BP 110/80 | HR 84 | Wt 222.0 lb

## 2017-10-08 DIAGNOSIS — E0590 Thyrotoxicosis, unspecified without thyrotoxic crisis or storm: Secondary | ICD-10-CM | POA: Diagnosis not present

## 2017-10-08 LAB — TSH: TSH: 0.61 u[IU]/mL (ref 0.35–4.50)

## 2017-10-08 LAB — T4, FREE: Free T4: 0.9 ng/dL (ref 0.60–1.60)

## 2017-10-08 MED ORDER — METHIMAZOLE 5 MG PO TABS: 5.0000 mg | ORAL_TABLET | Freq: Every day | ORAL | 3 refills | Status: DC
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
## 2017-10-08 NOTE — Patient Instructions (Addendum)
blood tests are requested for you today.  We'll let you know about the results. If ever you have fever while taking methimazole, stop it and call us, even if the reason is obvious, because of the risk of a rare side-effect.   Please come back for a follow-up appointment in 4-5 months.

## 2017-10-16 ENCOUNTER — Other Ambulatory Visit: Payer: Self-pay | Admitting: Internal Medicine

## 2017-10-16 DIAGNOSIS — R058 Other specified cough: Secondary | ICD-10-CM

## 2017-10-16 DIAGNOSIS — R05 Cough: Secondary | ICD-10-CM

## 2017-10-22 ENCOUNTER — Encounter: Payer: Self-pay | Admitting: Internal Medicine

## 2017-10-23 NOTE — Telephone Encounter (Signed)
Copied from [place] [id number]. Topic: General - Other >> Oct 23, 2017 12:10 PM Carolyn Stare wrote:  Pt cal lto say he has been on empagliflozin (JARDIANCE) 25 MG TABS tablet for 2 weeks and now he need to have his A1C check to see if it went down so that he can have his hand surgery

## 2017-10-27 ENCOUNTER — Telehealth: Payer: Self-pay | Admitting: Internal Medicine

## 2017-10-27 NOTE — Telephone Encounter (Signed)
Routing to dr john, please advise, I will call patient back, thanks 

## 2017-10-27 NOTE — Telephone Encounter (Signed)
Will route to office for clarification of directions; last seen in office 10/02/17 by Dr Cathlean Cower.

## 2017-10-27 NOTE — Telephone Encounter (Signed)
Copied from [place] [id number]. Topic: Quick Communication - See Telephone Encounter >> Oct 27, 2017  8:46 AM Marja Kays F wrote: Pt has been told that he does not have to check his sugar levels but twice a day now and does that need to before he takes his meds or after   Best number [phone]

## 2017-10-27 NOTE — Telephone Encounter (Signed)
Once daily in the AM prior to meds or food

## 2017-10-27 NOTE — Telephone Encounter (Signed)
Patient's wife, gaynelle (on [place]) advised of dr Judi Cong note/instructions, she has written this down and repeated back for understanding

## 2017-10-28 ENCOUNTER — Telehealth: Payer: Self-pay | Admitting: Internal Medicine

## 2017-10-28 MED ORDER — GLUCOSE BLOOD VI STRP: ORAL_STRIP | 3 refills | Status: DC

## 2017-10-28 MED ORDER — LANCETS MISC: 3 refills | Status: DC

## 2017-10-28 NOTE — Telephone Encounter (Signed)
Patient called back- He states he has approximately 5 test strips left.   I sent new Rx's for his lancets and test strips. However, his insurance may reject coverage due to early fill. I advised him per 10/27/17 tele encounter, Dr. Jenny Reichmann stated he only needs to be testing once daily now. See meds. I informed him if anything further is needed from [name] to contact us and to check with his pharmacy later today or tomorrow.

## 2017-10-28 NOTE — Telephone Encounter (Signed)
Copied from [place] [id number]. Topic: General - Other >> Oct 28, 2017  2:43 PM Keene Breath wrote: Reason for CRM: Patient called to get clarification on his medication for glucose blood (ONE TOUCH ULTRA TEST) test strip and Lancets MISC.  Patient stated that on the boxes it states that he use them 1x daily, and the doctor told the patient to use 4x daily.  The insurance company also thinks that it should be used 1x daily.  Patient is concerned that he is going to run out before he is supposed to get a refill and needs a new prescription sent or some clarification on how he is supposed to test and wants to make sure that the insurance company will cover the cost.  Please advise.  CB# [phone].

## 2017-10-28 NOTE — Telephone Encounter (Signed)
Called pt to see what his numbers have been being since Dr. Jenny Reichmann added Vania Rea. See 10/07/17 tele encounter. No answer. Unable to leave vm. Patient may be able to start testing once daily now.   Will try again later.

## 2017-11-07 ENCOUNTER — Ambulatory Visit: Payer: BLUE CROSS/BLUE SHIELD | Admitting: Registered"

## 2017-11-10 ENCOUNTER — Telehealth: Payer: Self-pay | Admitting: Internal Medicine

## 2017-11-10 DIAGNOSIS — M25332 Other instability, left wrist: Secondary | ICD-10-CM | POA: Diagnosis not present

## 2017-11-10 NOTE — Telephone Encounter (Signed)
Since I was not involved in cancelling the surgury, the patient will need to contact his Surgeon for this note.  thanks

## 2017-11-10 NOTE — Telephone Encounter (Signed)
Copied from [place] [phone]. Topic: Quick Communication - See Telephone Encounter >> Nov 10, 2017  4:16 PM Valla Leaver wrote: CRM for notification. See Telephone encounter for: 11/10/17. Please call patient back to assist with disability. Says he needs the information regarding being placed on Jardiance sent to [place] ATTN: [place] he couldn't have surgery because his blood sugar was too high.

## 2017-11-11 NOTE — Telephone Encounter (Signed)
Pt has been informed and expressed understanding.  

## 2017-11-19 NOTE — Progress Notes (Signed)
Spoke with Gabriel Coleman at Dr Levell July office to let her know we would need new labs and a note from Dr. Gwynn Burly office saying that the pt's diabetes is under control and that he is ok for surgery.

## 2017-11-27 ENCOUNTER — Encounter (HOSPITAL_BASED_OUTPATIENT_CLINIC_OR_DEPARTMENT_OTHER): Payer: Self-pay

## 2017-11-27 ENCOUNTER — Ambulatory Visit (HOSPITAL_BASED_OUTPATIENT_CLINIC_OR_DEPARTMENT_OTHER): Admit: 2017-11-27 | Payer: BLUE CROSS/BLUE SHIELD | Admitting: Orthopedic Surgery

## 2017-11-27 SURGERY — CARPECTOMY WITH RADIAL STYLOIDECTOMY
Anesthesia: Regional | Laterality: Left

## 2017-11-28 ENCOUNTER — Ambulatory Visit (INDEPENDENT_AMBULATORY_CARE_PROVIDER_SITE_OTHER): Payer: BLUE CROSS/BLUE SHIELD | Admitting: Family

## 2017-11-28 ENCOUNTER — Encounter: Payer: Self-pay | Admitting: Family

## 2017-11-28 ENCOUNTER — Other Ambulatory Visit (INDEPENDENT_AMBULATORY_CARE_PROVIDER_SITE_OTHER): Payer: BLUE CROSS/BLUE SHIELD

## 2017-11-28 ENCOUNTER — Other Ambulatory Visit: Payer: Self-pay | Admitting: Internal Medicine

## 2017-11-28 VITALS — BP 126/88 | HR 67 | Temp 98.7°F | Ht 72.0 in | Wt 221.1 lb

## 2017-11-28 DIAGNOSIS — N529 Male erectile dysfunction, unspecified: Secondary | ICD-10-CM

## 2017-11-28 DIAGNOSIS — E119 Type 2 diabetes mellitus without complications: Secondary | ICD-10-CM

## 2017-11-28 LAB — COMPREHENSIVE METABOLIC PANEL
ALK PHOS: 50 U/L (ref 39–117)
ALT: 13 U/L (ref 0–53)
AST: 12 U/L (ref 0–37)
Albumin: 4.7 g/dL (ref 3.5–5.2)
BUN: 13 mg/dL (ref 6–23)
CO2: 21 mEq/L (ref 19–32)
Calcium: 9.8 mg/dL (ref 8.4–10.5)
Chloride: 105 mEq/L (ref 96–112)
Creatinine, Ser: 0.87 mg/dL (ref 0.40–1.50)
GFR: 114.41 mL/min (ref >60.00)
GLUCOSE: 108 mg/dL — AB (ref 70–99)
POTASSIUM: 4.2 meq/L (ref 3.5–5.1)
Sodium: 137 mEq/L (ref 135–145)
TOTAL PROTEIN: 8.1 g/dL (ref 6.0–8.3)
Total Bilirubin: 0.5 mg/dL (ref 0.2–1.2)

## 2017-11-28 LAB — HEMOGLOBIN A1C: Hgb A1c MFr Bld: 9 % — ABNORMAL HIGH (ref 4.6–6.5)
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
## 2017-11-28 NOTE — Addendum Note (Signed)
Addended by: Raliegh Ip on: 11/28/2017 09:19 AM   Modules accepted: Orders

## 2017-12-01 LAB — TESTOSTERONE, FREE, TOTAL, SHBG
SEX HORMONE BINDING: 35.4 nmol/L (ref 19.3–76.4)
TESTOSTERONE FREE: 6.5 pg/mL — AB (ref 6.6–18.1)
TESTOSTERONE: 375 ng/dL (ref 264–916)

## 2017-12-03 ENCOUNTER — Other Ambulatory Visit: Payer: Self-pay | Admitting: Orthopedic Surgery

## 2017-12-12 ENCOUNTER — Encounter (HOSPITAL_BASED_OUTPATIENT_CLINIC_OR_DEPARTMENT_OTHER): Payer: Self-pay | Admitting: *Deleted

## 2017-12-12 ENCOUNTER — Other Ambulatory Visit: Payer: Self-pay

## 2017-12-18 ENCOUNTER — Encounter (HOSPITAL_BASED_OUTPATIENT_CLINIC_OR_DEPARTMENT_OTHER)
Admission: RE | Disposition: A | Discharge status: Home / Self Care | Payer: Self-pay | Source: Ambulatory Visit | Attending: Orthopedic Surgery

## 2017-12-18 ENCOUNTER — Encounter (HOSPITAL_BASED_OUTPATIENT_CLINIC_OR_DEPARTMENT_OTHER): Payer: Self-pay | Admitting: *Deleted

## 2017-12-18 ENCOUNTER — Other Ambulatory Visit: Payer: Self-pay

## 2017-12-18 ENCOUNTER — Ambulatory Visit (HOSPITAL_BASED_OUTPATIENT_CLINIC_OR_DEPARTMENT_OTHER): Payer: BLUE CROSS/BLUE SHIELD | Admitting: Anesthesiology

## 2017-12-18 ENCOUNTER — Ambulatory Visit (HOSPITAL_BASED_OUTPATIENT_CLINIC_OR_DEPARTMENT_OTHER)
Admission: RE | Admit: 2017-12-18 | Discharge: 2017-12-18 | Disposition: A | Discharge status: Home / Self Care | Payer: BLUE CROSS/BLUE SHIELD | Source: Ambulatory Visit | Attending: Orthopedic Surgery | Admitting: Orthopedic Surgery

## 2017-12-18 DIAGNOSIS — G8918 Other acute postprocedural pain: Secondary | ICD-10-CM | POA: Diagnosis not present

## 2017-12-18 DIAGNOSIS — I10 Essential (primary) hypertension: Secondary | ICD-10-CM | POA: Insufficient documentation

## 2017-12-18 DIAGNOSIS — M19132 Post-traumatic osteoarthritis, left wrist: Secondary | ICD-10-CM | POA: Diagnosis not present

## 2017-12-18 DIAGNOSIS — J449 Chronic obstructive pulmonary disease, unspecified: Secondary | ICD-10-CM | POA: Insufficient documentation

## 2017-12-18 DIAGNOSIS — S63512A Sprain of carpal joint of left wrist, initial encounter: Secondary | ICD-10-CM | POA: Diagnosis not present

## 2017-12-18 DIAGNOSIS — M24832 Other specific joint derangements of left wrist, not elsewhere classified: Secondary | ICD-10-CM | POA: Diagnosis not present

## 2017-12-18 DIAGNOSIS — E119 Type 2 diabetes mellitus without complications: Secondary | ICD-10-CM | POA: Diagnosis not present

## 2017-12-18 DIAGNOSIS — M25532 Pain in left wrist: Secondary | ICD-10-CM | POA: Diagnosis not present

## 2017-12-18 DIAGNOSIS — K219 Gastro-esophageal reflux disease without esophagitis: Secondary | ICD-10-CM | POA: Insufficient documentation

## 2017-12-18 DIAGNOSIS — Z87891 Personal history of nicotine dependence: Secondary | ICD-10-CM | POA: Diagnosis not present

## 2017-12-18 HISTORY — PX: ANTERIOR INTEROSSEOUS NERVE DECOMPRESSION: SHX5735

## 2017-12-18 HISTORY — PX: CARPECTOMY WITH RADIAL STYLOIDECTOMY: SHX5609

## 2017-12-18 HISTORY — DX: Thyrotoxicosis, unspecified without thyrotoxic crisis or storm: E05.90

## 2017-12-18 LAB — GLUCOSE, CAPILLARY
GLUCOSE-CAPILLARY: 92 mg/dL (ref 70–99)
Glucose-Capillary: 82 mg/dL (ref 70–99)

## 2017-12-18 SURGERY — CARPECTOMY WITH RADIAL STYLOIDECTOMY
Anesthesia: Monitor Anesthesia Care | Site: Wrist | Laterality: Left

## 2017-12-18 MED ORDER — ROPIVACAINE HCL 5 MG/ML IJ SOLN
INTRAMUSCULAR | Status: DC | PRN
  Administered 2017-12-18: 30 mL via PERINEURAL

## 2017-12-18 MED ORDER — OXYCODONE-ACETAMINOPHEN 7.5-325 MG PO TABS: 1.0000 | ORAL_TABLET | ORAL | 0 refills | Status: DC | PRN

## 2017-12-18 MED ORDER — OXYCODONE HCL 5 MG/5ML PO SOLN: 5.0000 mg | Freq: Once | ORAL | Status: DC | PRN

## 2017-12-18 MED ORDER — MIDAZOLAM HCL 2 MG/2ML IJ SOLN
1.0000 mg | INTRAMUSCULAR | Status: DC | PRN
  Administered 2017-12-18: 2 mg via INTRAVENOUS

## 2017-12-18 MED ORDER — MIDAZOLAM HCL 2 MG/2ML IJ SOLN
INTRAMUSCULAR | Status: AC
  Filled 2017-12-18: qty 2

## 2017-12-18 MED ORDER — OXYCODONE HCL 5 MG PO TABS: 5.0000 mg | ORAL_TABLET | Freq: Once | ORAL | Status: DC | PRN

## 2017-12-18 MED ORDER — FENTANYL CITRATE (PF) 100 MCG/2ML IJ SOLN
INTRAMUSCULAR | Status: AC
  Filled 2017-12-18: qty 2

## 2017-12-18 MED ORDER — CHLORHEXIDINE GLUCONATE 4 % EX LIQD: 60.0000 mL | Freq: Once | CUTANEOUS | Status: DC

## 2017-12-18 MED ORDER — CEFAZOLIN SODIUM-DEXTROSE 2-4 GM/100ML-% IV SOLN
INTRAVENOUS | Status: AC
  Filled 2017-12-18: qty 100

## 2017-12-18 MED ORDER — HYDROMORPHONE HCL 1 MG/ML IJ SOLN: 0.2500 mg | INTRAMUSCULAR | Status: DC | PRN

## 2017-12-18 MED ORDER — FENTANYL CITRATE (PF) 100 MCG/2ML IJ SOLN
50.0000 ug | INTRAMUSCULAR | Status: DC | PRN
  Administered 2017-12-18: 100 ug via INTRAVENOUS

## 2017-12-18 MED ORDER — ONDANSETRON HCL 4 MG/2ML IJ SOLN
INTRAMUSCULAR | Status: DC | PRN
  Administered 2017-12-18: 4 mg via INTRAVENOUS

## 2017-12-18 MED ORDER — SCOPOLAMINE 1 MG/3DAYS TD PT72: 1.0000 | MEDICATED_PATCH | Freq: Once | TRANSDERMAL | Status: DC | PRN

## 2017-12-18 MED ORDER — CEFAZOLIN SODIUM-DEXTROSE 2-4 GM/100ML-% IV SOLN
2.0000 g | INTRAVENOUS | Status: AC
  Administered 2017-12-18: 2 g via INTRAVENOUS

## 2017-12-18 MED ORDER — PROMETHAZINE HCL 25 MG/ML IJ SOLN: 6.2500 mg | INTRAMUSCULAR | Status: DC | PRN

## 2017-12-18 MED ORDER — LACTATED RINGERS IV SOLN
INTRAVENOUS | Status: DC
  Administered 2017-12-18: 10:00:00 via INTRAVENOUS

## 2017-12-18 MED ORDER — PROPOFOL 500 MG/50ML IV EMUL
INTRAVENOUS | Status: DC | PRN
  Administered 2017-12-18: 25 ug/kg/min via INTRAVENOUS

## 2017-12-18 SURGICAL SUPPLY — 49 items
BLADE ARTHRO LOK 4 BEAVER (BLADE) ×2 IMPLANT
BLADE MINI RND TIP GREEN BEAV (BLADE) ×2 IMPLANT
BLADE SURG 15 STRL LF DISP TIS (BLADE) ×1 IMPLANT
BLADE SURG 15 STRL SS (BLADE) ×1
BNDG COHESIVE 3X5 TAN STRL LF (GAUZE/BANDAGES/DRESSINGS) ×2 IMPLANT
BNDG ESMARK 4X9 LF (GAUZE/BANDAGES/DRESSINGS) ×2 IMPLANT
BNDG GAUZE ELAST 4 BULKY (GAUZE/BANDAGES/DRESSINGS) ×2 IMPLANT
CHLORAPREP W/TINT 26ML (MISCELLANEOUS) ×2 IMPLANT
CORD BIPOLAR FORCEPS 12FT (ELECTRODE) ×2 IMPLANT
COVER BACK TABLE 60X90IN (DRAPES) ×2 IMPLANT
COVER MAYO STAND STRL (DRAPES) ×2 IMPLANT
COVER WAND RF STERILE (DRAPES) IMPLANT
CUFF TOURNIQUET SINGLE 18IN (TOURNIQUET CUFF) IMPLANT
DECANTER SPIKE VIAL GLASS SM (MISCELLANEOUS) IMPLANT
DRAPE EXTREMITY T 121X128X90 (DRAPE) ×2 IMPLANT
DRAPE OEC MINIVIEW 54X84 (DRAPES) ×2 IMPLANT
DRAPE SURG 17X23 STRL (DRAPES) ×2 IMPLANT
GAUZE SPONGE 4X4 12PLY STRL (GAUZE/BANDAGES/DRESSINGS) ×2 IMPLANT
GAUZE XEROFORM 1X8 LF (GAUZE/BANDAGES/DRESSINGS) ×2 IMPLANT
GLOVE BIOGEL PI IND STRL 8 (GLOVE) ×1 IMPLANT
GLOVE BIOGEL PI IND STRL 8.5 (GLOVE) ×1 IMPLANT
GLOVE BIOGEL PI INDICATOR 8 (GLOVE) ×1
GLOVE BIOGEL PI INDICATOR 8.5 (GLOVE) ×1
GLOVE SURG ORTHO 8.0 STRL STRW (GLOVE) ×2 IMPLANT
GOWN STRL REUS W/ TWL LRG LVL3 (GOWN DISPOSABLE) ×1 IMPLANT
GOWN STRL REUS W/TWL LRG LVL3 (GOWN DISPOSABLE) ×1
GOWN STRL REUS W/TWL XL LVL3 (GOWN DISPOSABLE) ×2 IMPLANT
NS IRRIG 1000ML POUR BTL (IV SOLUTION) ×2 IMPLANT
PACK BASIN DAY SURGERY FS (CUSTOM PROCEDURE TRAY) ×2 IMPLANT
PAD CAST 3X4 CTTN HI CHSV (CAST SUPPLIES) ×1 IMPLANT
PADDING CAST ABS 3INX4YD NS (CAST SUPPLIES)
PADDING CAST ABS 4INX4YD NS (CAST SUPPLIES) ×1
PADDING CAST ABS COTTON 3X4 (CAST SUPPLIES) IMPLANT
PADDING CAST ABS COTTON 4X4 ST (CAST SUPPLIES) ×1 IMPLANT
PADDING CAST COTTON 3X4 STRL (CAST SUPPLIES) ×1
SLEEVE SCD COMPRESS KNEE MED (MISCELLANEOUS) ×2 IMPLANT
SPLINT PLASTER CAST XFAST 3X15 (CAST SUPPLIES) IMPLANT
SPLINT PLASTER XTRA FASTSET 3X (CAST SUPPLIES)
STOCKINETTE 4X48 STRL (DRAPES) ×2 IMPLANT
SUT ETHILON 4 0 PS 2 18 (SUTURE) ×2 IMPLANT
SUT VIC AB 0 CT1 27 (SUTURE)
SUT VIC AB 0 CT1 27XBRD ANBCTR (SUTURE) IMPLANT
SUT VIC AB 2-0 SH 27 (SUTURE)
SUT VIC AB 2-0 SH 27XBRD (SUTURE) IMPLANT
SUT VICRYL 4-0 PS2 18IN ABS (SUTURE) ×2 IMPLANT
SYR BULB 3OZ (MISCELLANEOUS) ×2 IMPLANT
SYR CONTROL 10ML LL (SYRINGE) IMPLANT
TOWEL GREEN STERILE FF (TOWEL DISPOSABLE) ×2 IMPLANT
UNDERPAD 30X30 (UNDERPADS AND DIAPERS) ×2 IMPLANT

## 2017-12-18 NOTE — Op Note (Signed)
NAME: Gabriel Coleman MEDICAL RECORD NO: [id number] DATE OF BIRTH: 01-14-1956 FACILITY: [facility] LOCATION: [facility] PHYSICIAN: Wynonia Sours, MD   OPERATIVE REPORT   DATE OF PROCEDURE: 12/18/17    PREOPERATIVE DIAGNOSIS:   Scapholunate ligament dissociation slack wrist left   POSTOPERATIVE DIAGNOSIS:   Same   PROCEDURE:   Proximal row carpectomy left wrist with radial styloidectomy and posterior interosseous nerve resection for preoperative pain    SURGEON: Daryll Brod, M.D.   ASSISTANT: Leanora Cover, MD   ANESTHESIA:  Regional with sedation   INTRAVENOUS FLUIDS:  Per anesthesia flow sheet.   ESTIMATED BLOOD LOSS:  Minimal.   COMPLICATIONS:  None.   SPECIMENS:  none   TOURNIQUET TIME:    Total Tourniquet Time Documented: Upper Arm (Left) - 59 minutes Total: Upper Arm (Left) - 59 minutes    DISPOSITION:  Stable to PACU.   INDICATIONS: Patient is a 62 year old male with a history of a scapholunate ligament injury.  He has significant widening the scapholunate ligament complex with arthritic change at the radial scaphoid joint and loose body.  This is been inspected treated with arthroscopy without complete relief of symptoms.  He is desirous of proceeding for more definitive procedure for control of his pain.  Pre-peri-and postoperative course been discussed along with risks and complications and that he is scheduled for a proximal carpectomy with radial styloidectomy and posterior interosseous nerve resection.  In preoperative area the patient seen extremity marked by both patient and surgeon antibiotic given  OPERATIVE COURSE: Patient is brought to the operating room after supraclavicular block was carried out without difficulty in the preoperative area under the direction the anesthesia department.  He was prepped using ChloraPrep in the supine position left arm free.  A three-minute dry time was allowed and timeout taken to confirm patient procedure.  The  limb was exsanguinated with an Esmarch bandage turn placed on the arm was inflated to 250 mmHg.  A longitudinal incision was made over the left dorsal wrist carried down through subcutaneous tissue.  Bleeders were electrocauterized bipolar.  Neural structures were protected as much as possible.  An incision was then made at the junction of the third fourth dorsal compartment extensor tendon.  The posterior interosseous nerve was identified.  This was done after retracting the extensor tendons release of the EPL tendon.  A approximately a 2-1/2 to 3 cm segment of the nerve was resected leaving the artery behind.  An incision was then made longitudinally in the capsule of the joint.  A cuff of tissue was maintained at the insertion of the of the ligaments on the dorsal radius.  T was then made.  This allowed visualization of the proximal capitate which did not show significant degenerative changes.  The loop was identified the scapholunate ligament was widely patent with significant rotation of the scaphoid and arthritic changes of the radial scaphoid facet.  With sharp and blunt dissection using a rondure the scaphoid was removed piecemeal including the distal pole as much as possible.  The radial styloid was identified found to be arthritic with pointing.  This was isolated with blunt sharp dissection and removed with a rondure to removing the styloid but protecting the radioscaphocapitate ligament.  The triquetrum was then isolated along with the lunate.  The dorsal capsule was incised with sharp dissection.  The luno triquetral ligament was then incised allowing rotation of the triquetrum the volar ligaments were then sharply and bluntly dissected off from the  bone with Beaver blades and a curette.  This allowed complete removal of the triquetrum and one piece.  The lunate was attended to next.  This was grasped with a large towel clip this allowed to be rotated with sharp dissection along the volar border at the  insertion of ligaments this was gradually released and removed from the carpus.  The capitate immediately translocated proximally into the lunate facet.  This was confirmed with image intensification.  The wound was copiously irrigated with saline.  The capsule was then closed with 3-0 Ethibond sutures.  The extensor retinaculum was then repaired with the Ethibond leaving the EPL transposed.  The subcutaneous tissue was closed with interrupted 4-0 Vicryl and the skin with interrupted 4-0 nylon sutures.  A sterile compressive dressing dorsal palmar splint was applied.  X-rays confirmed positioning the capitate and lunate fossa on both AP and lateral directions.  The patient tolerated the procedure well was taken to the recovery room for observation in satisfactory condition.  He will be discharged home to return [place] in 1 week on Percocet.   Daryll Brod, MD Electronically signed, 12/18/17

## 2017-12-18 NOTE — Anesthesia Procedure Notes (Signed)
Anesthesia Regional Block: Supraclavicular block   Pre-Anesthetic Checklist: ,, timeout performed, Correct Patient, Correct Site, Correct Laterality, Correct Procedure, Correct Position, site marked, Risks and benefits discussed,  Surgical consent,  Pre-op evaluation,  At surgeon's request and post-op pain management  Laterality: Left  Prep: chloraprep       Needles:  Injection technique: Single-shot  Needle Type: Stimiplex     Needle Length: 9cm  Needle Gauge: 21     Additional Needles:   Procedures:,,,, ultrasound used (permanent image in chart),,,,  Narrative:  Start time: 12/18/2017 10:42 AM End time: 12/18/2017 10:47 AM Injection made incrementally with aspirations every 5 mL.  Performed by: Personally  Anesthesiologist: Lynda Rainwater, MD

## 2017-12-18 NOTE — Anesthesia Preprocedure Evaluation (Signed)
Anesthesia Evaluation  Patient identified by MRN, date of birth, ID band Patient awake    Reviewed: Allergy & Precautions, NPO status , Patient's Chart, lab work & pertinent test results  Airway Mallampati: II  TM Distance: >3 FB Neck ROM: Full    Dental  (+) Poor Dentition, Loose, Missing,    Pulmonary COPD,  COPD inhaler, former smoker,    Pulmonary exam normal breath sounds clear to auscultation       Cardiovascular hypertension, Pt. on medications Normal cardiovascular exam Rhythm:Regular Rate:Normal  Myocardial perfusion scan 07/22/2017 1. EF 57%, normal wall motion.  2. Fixed small, mild basal inferior perfusion defect.  Given normal wall motion, suspect diaphragmatic attenuation.  No ischemia.   Low risk study.    Neuro/Psych negative neurological ROS  negative psych ROS   GI/Hepatic GERD  Medicated and Controlled,(+) Hepatitis -, B, C  Endo/Other  diabetesHyperthyroidism   Renal/GU negative Renal ROS  negative genitourinary   Musculoskeletal  (+) Arthritis , Osteoarthritis,  Scapulolunate dissociation of left wrist   Abdominal (+) - obese,   Peds  Hematology negative hematology ROS (+)   Anesthesia Other Findings   Reproductive/Obstetrics                             Anesthesia Physical  Anesthesia Plan  ASA: III  Anesthesia Plan: Regional and MAC   Post-op Pain Management:    Induction: Intravenous  PONV Risk Score and Plan: 1 and Ondansetron, Treatment may vary due to age or medical condition and Propofol infusion  Airway Management Planned: Simple Face Mask  Additional Equipment:   Intra-op Plan:   Post-operative Plan:   Informed Consent: I have reviewed the patients History and Physical, chart, labs and discussed the procedure including the risks, benefits and alternatives for the proposed anesthesia with the patient or authorized representative who has indicated  his/her understanding and acceptance.   Dental advisory given  Plan Discussed with: CRNA and Surgeon  Anesthesia Plan Comments:         Anesthesia Quick Evaluation
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
## 2017-12-18 NOTE — Op Note (Signed)
I assisted Surgeon(s) and Role:    * Daryll Brod, MD - Primary    * Leanora Cover, MD - Assisting on the Procedure(s): CARPECTOMY PROXIMAL ROW WITH RADIAL STYLOIDECTOMY LEFT WRIST POSTERIOR INTEROSSEOUS NERVE EXCISION LEFT WRIST on 12/18/2017.  I provided assistance on this case as follows: retraction soft tissue, removal carpal bone, positioning hand.  Electronically signed by: Leanora Cover, MD Date: 12/18/2017 Time: 12:18 PM

## 2017-12-18 NOTE — Brief Op Note (Signed)
12/18/2017  12:16 PM  PATIENT:  Gabriel Coleman  62 y.o. male  PRE-OPERATIVE DIAGNOSIS:  SCAPHOLUNATE ADVANCE COLLAPSE LEFT WRIST  POST-OPERATIVE DIAGNOSIS:  SCAPHOLUNATE ADVANCE COLLAPSE LEFT WRIST  PROCEDURE:  Procedure(s): CARPECTOMY PROXIMAL ROW WITH RADIAL STYLOIDECTOMY LEFT WRIST (Left) POSTERIOR INTEROSSEOUS NERVE EXCISION LEFT WRIST (Left)  SURGEON:  Surgeon(s) and Role:    * Daryll Brod, MD - Primary    * Leanora Cover, MD - Assisting  PHYSICIAN ASSISTANT:   ASSISTANTS: K Uva Runkel,MD   ANESTHESIA:   regional and IV sedation  EBL:  41ml  BLOOD ADMINISTERED:none  DRAINS: none   LOCAL MEDICATIONS USED:  NONE  SPECIMEN:  No Specimen  DISPOSITION OF SPECIMEN:  N/A  COUNTS:  YES  TOURNIQUET:   Total Tourniquet Time Documented: Upper Arm (Left) - 59 minutes Total: Upper Arm (Left) - 59 minutes   DICTATION: .Viviann Spare Dictation  PLAN OF CARE: Discharge to home after PACU  PATIENT DISPOSITION:  PACU - hemodynamically stable.

## 2017-12-18 NOTE — Discharge Instructions (Addendum)
Post Anesthesia Home Care Instructions  Activity: Get plenty of rest for the remainder of the day. A responsible individual must stay with you for 24 hours following the procedure.  For the next 24 hours, DO NOT: -Drive a car -Operate machinery -Drink alcoholic beverages -Take any medication unless instructed by your physician -Make any legal decisions or sign important papers.  Meals: Start with liquid foods such as gelatin or soup. Progress to regular foods as tolerated. Avoid greasy, spicy, heavy foods. If nausea and/or vomiting occur, drink only clear liquids until the nausea and/or vomiting subsides. Call your physician if vomiting continues.  Special Instructions/Symptoms: Your throat may feel dry or sore from the anesthesia or the breathing tube placed in your throat during surgery. If this causes discomfort, gargle with warm salt water. The discomfort should disappear within 24 hours.  If you had a scopolamine patch placed behind your ear for the management of post- operative nausea and/or vomiting:  1. The medication in the patch is effective for 72 hours, after which it should be removed.  Wrap patch in a tissue and discard in the trash. Wash hands thoroughly with soap and water. 2. You may remove the patch earlier than 72 hours if you experience unpleasant side effects which may include dry mouth, dizziness or visual disturbances. 3. Avoid touching the patch. Wash your hands with soap and water after contact with the patch.      Regional Anesthesia Blocks  1. Numbness or the inability to move the "blocked" extremity may last from 3-48 hours after placement. The length of time depends on the medication injected and your individual response to the medication. If the numbness is not going away after 48 hours, call your surgeon.  2. The extremity that is blocked will need to be protected until the numbness is gone and the  Strength has returned. Because you cannot feel it, you  will need to take extra care to avoid injury. Because it may be weak, you may have difficulty moving it or using it. You may not know what position it is in without looking at it while the block is in effect.  3. For blocks in the legs and feet, returning to weight bearing and walking needs to be done carefully. You will need to wait until the numbness is entirely gone and the strength has returned. You should be able to move your leg and foot normally before you try and bear weight or walk. You will need someone to be with you when you first try to ensure you do not fall and possibly risk injury.  4. Bruising and tenderness at the needle site are common side effects and will resolve in a few days.  5. Persistent numbness or new problems with movement should be communicated to the surgeon or the [facility] ([phone])/ [facility] ([phone]).    [facility] Center Instructions Hand Surgery  Wound Care: Keep your hand elevated above the level of your heart.  Do not allow it to dangle by your side.  Keep the dressing dry and do not remove it unless your doctor advises you to do so.  He will usually change it at the time of your post-op visit.  Moving your fingers is advised to stimulate circulation but will depend on the site of your surgery.  If you have a splint applied, your doctor will advise you regarding movement.  Activity: Do not drive or operate machinery today.  Rest today and   then you may return to your normal activity and work as indicated by your physician.  Diet:  Drink liquids today or eat a light diet.  You may resume a regular diet tomorrow.    General expectations: Pain for two to three days. Fingers may become slightly swollen.  Call your doctor if any of the following occur: Severe pain not relieved by pain medication. Elevated temperature. Dressing soaked with blood. Inability to move fingers. White or bluish color to fingers.  

## 2017-12-18 NOTE — Anesthesia Postprocedure Evaluation (Signed)
Anesthesia Post Note  Patient: Gabriel Coleman  Procedure(s) Performed: CARPECTOMY PROXIMAL ROW WITH RADIAL STYLOIDECTOMY LEFT WRIST (Left Wrist) POSTERIOR INTEROSSEOUS NERVE EXCISION LEFT WRIST (Left Wrist)     Patient location during evaluation: PACU Anesthesia Type: Regional and MAC Level of consciousness: awake and alert Pain management: pain level controlled Vital Signs Assessment: post-procedure vital signs reviewed and stable Respiratory status: spontaneous breathing, nonlabored ventilation and respiratory function stable Cardiovascular status: stable and blood pressure returned to baseline Postop Assessment: no apparent nausea or vomiting Anesthetic complications: no    Last Vitals:  Vitals:   12/18/17 1246 12/18/17 1323  BP: 118/83 117/89  Pulse: 69 69  Resp: 14 16  Temp:  36.5 C  SpO2: 94% 95%    Last Pain:  Vitals:   12/18/17 1323  TempSrc:   PainSc: 0-No pain                 Lynda Rainwater

## 2017-12-18 NOTE — Progress Notes (Signed)
Assisted Dr. Miller with left, ultrasound guided, supraclavicular block. Side rails up, monitors on throughout procedure. See vital signs in flow sheet. Tolerated Procedure well. 

## 2017-12-19 ENCOUNTER — Encounter (HOSPITAL_BASED_OUTPATIENT_CLINIC_OR_DEPARTMENT_OTHER): Payer: Self-pay | Admitting: Orthopedic Surgery

## 2017-12-24 DIAGNOSIS — M25632 Stiffness of left wrist, not elsewhere classified: Secondary | ICD-10-CM | POA: Diagnosis not present

## 2017-12-24 DIAGNOSIS — M25532 Pain in left wrist: Secondary | ICD-10-CM | POA: Diagnosis not present

## 2017-12-29 ENCOUNTER — Ambulatory Visit (INDEPENDENT_AMBULATORY_CARE_PROVIDER_SITE_OTHER): Payer: BLUE CROSS/BLUE SHIELD | Admitting: Internal Medicine

## 2017-12-29 ENCOUNTER — Encounter: Payer: Self-pay | Admitting: Internal Medicine

## 2017-12-29 VITALS — BP 116/78 | HR 68 | Temp 98.9°F | Ht 72.0 in | Wt 217.0 lb

## 2017-12-29 DIAGNOSIS — J449 Chronic obstructive pulmonary disease, unspecified: Secondary | ICD-10-CM

## 2017-12-29 DIAGNOSIS — R351 Nocturia: Secondary | ICD-10-CM | POA: Diagnosis not present

## 2017-12-29 DIAGNOSIS — Z0000 Encounter for general adult medical examination without abnormal findings: Secondary | ICD-10-CM

## 2017-12-29 DIAGNOSIS — E119 Type 2 diabetes mellitus without complications: Secondary | ICD-10-CM

## 2017-12-29 DIAGNOSIS — I10 Essential (primary) hypertension: Secondary | ICD-10-CM

## 2017-12-29 MED ORDER — TAMSULOSIN HCL 0.4 MG PO CAPS: 0.4000 mg | ORAL_CAPSULE | Freq: Every day | ORAL | 3 refills | Status: DC
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
## 2017-12-29 NOTE — Patient Instructions (Signed)
Please take all new medication as prescribed - the flomax for prostate (and ok to stop after 3-5 days if you dont see any improvement)  Please continue all other medications as before, and refills have been done if requested.  Please have the pharmacy call with any other refills you may need.  Please continue your efforts at being more active, low cholesterol diet, and weight control.  Please keep your appointments with your specialists as you may have planned  Please return in 6 months, or sooner if needed, with Lab testing done 3-5 days before

## 2017-12-29 NOTE — Assessment & Plan Note (Signed)
Ok for flomax trial asd,  to f/u any worsening symptoms or concerns

## 2017-12-29 NOTE — Assessment & Plan Note (Signed)
stable overall by history and exam, recent data reviewed with pt, and pt to continue medical treatment as before,  to f/u any worsening symptoms or concerns  

## 2017-12-29 NOTE — Assessment & Plan Note (Signed)
Improved, cont same tx, has endo f/u jan 2020

## 2017-12-31 DIAGNOSIS — M25332 Other instability, left wrist: Secondary | ICD-10-CM | POA: Diagnosis not present

## 2018-01-14 DIAGNOSIS — M25332 Other instability, left wrist: Secondary | ICD-10-CM | POA: Diagnosis not present

## 2018-01-21 DIAGNOSIS — M25332 Other instability, left wrist: Secondary | ICD-10-CM | POA: Diagnosis not present

## 2018-01-21 DIAGNOSIS — M25632 Stiffness of left wrist, not elsewhere classified: Secondary | ICD-10-CM | POA: Diagnosis not present

## 2018-01-21 DIAGNOSIS — M25532 Pain in left wrist: Secondary | ICD-10-CM | POA: Diagnosis not present

## 2018-02-25 ENCOUNTER — Ambulatory Visit: Payer: BLUE CROSS/BLUE SHIELD | Admitting: Endocrinology

## 2018-02-25 ENCOUNTER — Telehealth: Payer: Self-pay | Admitting: Endocrinology

## 2018-02-25 NOTE — Telephone Encounter (Signed)
Please schedule f/u appt for next available appointment  

## 2018-02-25 NOTE — Telephone Encounter (Signed)
Patient no showed today's appt. Please advise on how to follow up. °A. No follow up necessary. °B. Follow up urgent. Contact patient immediately. °C. Follow up necessary. Contact patient and schedule visit in ___ days. °D. Follow up advised. Contact patient and schedule visit in ____weeks. ° °Would you like the NS fee to be applied to this visit? ° °

## 2018-02-26 NOTE — Telephone Encounter (Signed)
lft vm to return call to reschedule appt

## 2018-02-26 NOTE — Telephone Encounter (Signed)
Please refer to Dr. Ellison's response 

## 2018-03-04 NOTE — Telephone Encounter (Signed)
Spoke with pt he has lost his job and will call us back when he has insurance and a job again

## 2018-03-09 ENCOUNTER — Other Ambulatory Visit: Payer: Self-pay | Admitting: Internal Medicine

## 2018-04-10 ENCOUNTER — Other Ambulatory Visit: Payer: Self-pay | Admitting: Internal Medicine

## 2018-04-10 DIAGNOSIS — I10 Essential (primary) hypertension: Secondary | ICD-10-CM

## 2018-06-09 ENCOUNTER — Other Ambulatory Visit: Payer: Self-pay | Admitting: Internal Medicine

## 2018-06-24 ENCOUNTER — Other Ambulatory Visit (INDEPENDENT_AMBULATORY_CARE_PROVIDER_SITE_OTHER): Payer: Self-pay

## 2018-06-24 DIAGNOSIS — Z0000 Encounter for general adult medical examination without abnormal findings: Secondary | ICD-10-CM

## 2018-06-24 DIAGNOSIS — E119 Type 2 diabetes mellitus without complications: Secondary | ICD-10-CM

## 2018-06-24 LAB — CBC WITH DIFFERENTIAL/PLATELET
Basophils Absolute: 0 10*3/uL (ref 0.0–0.1)
Basophils Relative: 0.6 % (ref 0.0–3.0)
Eosinophils Absolute: 0.1 10*3/uL (ref 0.0–0.7)
Eosinophils Relative: 1 % (ref 0.0–5.0)
HCT: 45.3 % (ref 39.0–52.0)
Hemoglobin: 15.3 g/dL (ref 13.0–17.0)
Lymphocytes Relative: 42.1 % (ref 12.0–46.0)
Lymphs Abs: 3.1 10*3/uL (ref 0.7–4.0)
MCHC: 33.9 g/dL (ref 30.0–36.0)
MCV: 87.9 fl (ref 78.0–100.0)
Monocytes Absolute: 0.7 10*3/uL (ref 0.1–1.0)
Monocytes Relative: 10 % (ref 3.0–12.0)
Neutro Abs: 3.4 10*3/uL (ref 1.4–7.7)
Neutrophils Relative %: 46.3 % (ref 43.0–77.0)
Platelets: 214 10*3/uL (ref 150.0–400.0)
RBC: 5.15 Mil/uL (ref 4.22–5.81)
RDW: 13.5 % (ref 11.5–15.5)
WBC: 7.3 10*3/uL (ref 4.0–10.5)

## 2018-06-24 LAB — URINALYSIS, ROUTINE W REFLEX MICROSCOPIC
Bilirubin Urine: NEGATIVE
Hgb urine dipstick: NEGATIVE
Ketones, ur: NEGATIVE
Leukocytes,Ua: NEGATIVE
Nitrite: NEGATIVE
RBC / HPF: NONE SEEN (ref 0–?)
Specific Gravity, Urine: 1.02 (ref 1.000–1.030)
Total Protein, Urine: NEGATIVE
Urine Glucose: NEGATIVE
Urobilinogen, UA: 0.2 (ref 0.0–1.0)
pH: 7.5 (ref 5.0–8.0)

## 2018-06-24 LAB — LIPID PANEL
Cholesterol: 152 mg/dL (ref 0–200)
HDL: 44.4 mg/dL (ref >39.00)
LDL Cholesterol: 84 mg/dL (ref 0–99)
NonHDL: 107.88
Total CHOL/HDL Ratio: 3
Triglycerides: 120 mg/dL (ref 0.0–149.0)
VLDL: 24 mg/dL (ref 0.0–40.0)

## 2018-06-24 LAB — BASIC METABOLIC PANEL
BUN: 10 mg/dL (ref 6–23)
CO2: 25 mEq/L (ref 19–32)
Calcium: 9.4 mg/dL (ref 8.4–10.5)
Chloride: 103 mEq/L (ref 96–112)
Creatinine, Ser: 0.9 mg/dL (ref 0.40–1.50)
GFR: 103.32 mL/min (ref >60.00)
Glucose, Bld: 111 mg/dL — ABNORMAL HIGH (ref 70–99)
Potassium: 4 mEq/L (ref 3.5–5.1)
Sodium: 137 mEq/L (ref 135–145)

## 2018-06-24 LAB — TSH: TSH: 1.6 u[IU]/mL (ref 0.35–4.50)

## 2018-06-24 LAB — HEPATIC FUNCTION PANEL
ALT: 17 U/L (ref 0–53)
AST: 16 U/L (ref 0–37)
Albumin: 4.3 g/dL (ref 3.5–5.2)
Alkaline Phosphatase: 49 U/L (ref 39–117)
Bilirubin, Direct: 0.1 mg/dL (ref 0.0–0.3)
Total Bilirubin: 0.3 mg/dL (ref 0.2–1.2)
Total Protein: 7.7 g/dL (ref 6.0–8.3)

## 2018-06-24 LAB — MICROALBUMIN / CREATININE URINE RATIO
Creatinine,U: 156.7 mg/dL
Microalb Creat Ratio: 2.6 mg/g (ref 0.0–30.0)
Microalb, Ur: 4 mg/dL — ABNORMAL HIGH (ref 0.0–1.9)

## 2018-06-24 LAB — PSA: PSA: 1.97 ng/mL (ref 0.10–4.00)

## 2018-06-24 LAB — HEMOGLOBIN A1C: Hgb A1c MFr Bld: 6.7 % — ABNORMAL HIGH (ref 4.6–6.5)

## 2018-06-30 ENCOUNTER — Ambulatory Visit (INDEPENDENT_AMBULATORY_CARE_PROVIDER_SITE_OTHER): Payer: Self-pay | Admitting: Internal Medicine

## 2018-06-30 ENCOUNTER — Encounter: Payer: Self-pay | Admitting: Internal Medicine

## 2018-06-30 ENCOUNTER — Other Ambulatory Visit: Payer: Self-pay

## 2018-06-30 VITALS — BP 140/84 | HR 75 | Temp 98.5°F | Ht 72.0 in | Wt 233.0 lb

## 2018-06-30 DIAGNOSIS — I10 Essential (primary) hypertension: Secondary | ICD-10-CM

## 2018-06-30 DIAGNOSIS — E119 Type 2 diabetes mellitus without complications: Secondary | ICD-10-CM

## 2018-06-30 DIAGNOSIS — Z0000 Encounter for general adult medical examination without abnormal findings: Secondary | ICD-10-CM

## 2018-06-30 MED ORDER — SILDENAFIL CITRATE 100 MG PO TABS: 50.0000 mg | ORAL_TABLET | Freq: Every day | ORAL | 11 refills | Status: DC | PRN

## 2018-06-30 MED ORDER — LOSARTAN POTASSIUM 100 MG PO TABS: 100.0000 mg | ORAL_TABLET | Freq: Every day | ORAL | 3 refills | Status: DC

## 2018-06-30 NOTE — Patient Instructions (Signed)
Ok to increase the losartan to 100 mg per day  Please take all new medication as prescribed - the generic viagra  Please continue all other medications as before, and refills have been done if requested.  Please have the pharmacy call with any other refills you may need.  Please continue your efforts at being more active, low cholesterol diet, and weight control.  You are otherwise up to date with prevention measures today.  Please keep your appointments with your specialists as you may have planned  Please return in 6 months, or sooner if needed, with Lab testing done 3-5 days before

## 2018-06-30 NOTE — Assessment & Plan Note (Signed)
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
## 2018-06-30 NOTE — Assessment & Plan Note (Signed)
Stable, cont current tx

## 2018-06-30 NOTE — Assessment & Plan Note (Signed)
[place] for increased losartan to 100 qd

## 2018-10-05 ENCOUNTER — Other Ambulatory Visit: Payer: Self-pay

## 2018-10-05 DIAGNOSIS — I10 Essential (primary) hypertension: Secondary | ICD-10-CM

## 2018-10-05 MED ORDER — AMLODIPINE BESYLATE 5 MG PO TABS: ORAL_TABLET | ORAL | 1 refills | Status: DC

## 2018-10-20 ENCOUNTER — Ambulatory Visit (INDEPENDENT_AMBULATORY_CARE_PROVIDER_SITE_OTHER): Payer: Self-pay | Admitting: Endocrinology

## 2018-10-20 ENCOUNTER — Encounter: Payer: Self-pay | Admitting: Endocrinology

## 2018-10-20 ENCOUNTER — Other Ambulatory Visit: Payer: Self-pay

## 2018-10-20 VITALS — BP 130/98 | HR 71 | Ht 72.0 in | Wt 236.8 lb

## 2018-10-20 DIAGNOSIS — E0590 Thyrotoxicosis, unspecified without thyrotoxic crisis or storm: Secondary | ICD-10-CM

## 2018-10-20 MED ORDER — METHIMAZOLE 5 MG PO TABS: 5.0000 mg | ORAL_TABLET | Freq: Every day | ORAL | 1 refills | Status: DC
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
## 2018-10-20 NOTE — Patient Instructions (Addendum)
Your blood pressure is high today.  Please see your primary care provider soon, to have it rechecked A thyroid blood test is requested for you today.  We'll let you know about the results.  If ever you have fever while taking methimazole, stop it and call us, even if the reason is obvious, because of the risk of a rare side-effect.   Please come back for a follow-up appointment in 6 months.

## 2018-10-21 LAB — TSH: TSH: 0.89 u[IU]/mL (ref 0.35–4.50)

## 2018-11-03 ENCOUNTER — Other Ambulatory Visit: Payer: Self-pay | Admitting: Internal Medicine

## 2018-11-03 MED ORDER — ROSUVASTATIN CALCIUM 20 MG PO TABS: ORAL_TABLET | ORAL | 2 refills | Status: DC

## 2018-11-03 MED ORDER — METFORMIN HCL ER 500 MG PO TB24: 2000.0000 mg | ORAL_TABLET | Freq: Every day | ORAL | 0 refills | Status: DC

## 2018-11-03 MED ORDER — METFORMIN HCL ER 500 MG PO TB24: 2000.0000 mg | ORAL_TABLET | Freq: Every day | ORAL | 2 refills | Status: DC

## 2018-11-03 MED ORDER — ROSUVASTATIN CALCIUM 20 MG PO TABS: ORAL_TABLET | ORAL | 1 refills | Status: DC

## 2018-11-03 NOTE — Telephone Encounter (Signed)
Pt request refill   metFORMIN (GLUCOPHAGE-XR) 500 MG 24 hr tablet  rosuvastatin (CRESTOR) 20 MG tablet  Pt has no insurance so needs 30 day supply w/ refills in order to get their discount  [facility] [facility] [place], [place] [address] [place] [phone] (Phone) [phone] (Fax)

## 2018-11-03 NOTE — Addendum Note (Signed)
Addended by: Juliet Rude on: 11/03/2018 02:30 PM   Modules accepted: Orders

## 2019-01-05 ENCOUNTER — Encounter: Payer: Self-pay | Admitting: Internal Medicine

## 2019-01-05 ENCOUNTER — Ambulatory Visit (INDEPENDENT_AMBULATORY_CARE_PROVIDER_SITE_OTHER): Payer: Self-pay | Admitting: Internal Medicine

## 2019-01-05 ENCOUNTER — Other Ambulatory Visit: Payer: Self-pay

## 2019-01-05 VITALS — BP 136/90 | HR 76 | Temp 99.0°F | Wt 241.1 lb

## 2019-01-05 DIAGNOSIS — E119 Type 2 diabetes mellitus without complications: Secondary | ICD-10-CM

## 2019-01-05 DIAGNOSIS — I10 Essential (primary) hypertension: Secondary | ICD-10-CM

## 2019-01-05 DIAGNOSIS — N50812 Left testicular pain: Secondary | ICD-10-CM

## 2019-01-05 LAB — POCT GLYCOSYLATED HEMOGLOBIN (HGB A1C): Hemoglobin A1C: 6.5 % — AB (ref 4.0–5.6)

## 2019-01-05 NOTE — Patient Instructions (Signed)
Your A1c was OK today  Please continue all other medications as before, and refills have been done if requested.  Please have the pharmacy call with any other refills you may need.  Please continue your efforts at being more active, low cholesterol diet, and weight control.  Please keep your appointments with your specialists as you may have planned  Please return in 6 months, or sooner if needed 
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
## 2019-01-10 ENCOUNTER — Encounter: Payer: Self-pay | Admitting: Internal Medicine

## 2019-01-10 DIAGNOSIS — N50812 Left testicular pain: Secondary | ICD-10-CM | POA: Insufficient documentation

## 2019-01-10 NOTE — Assessment & Plan Note (Signed)
stable overall by history and exam, recent data reviewed with pt, and pt to continue medical treatment as before,  to f/u any worsening symptoms or concerns  

## 2019-01-10 NOTE — Assessment & Plan Note (Signed)
Chronic, declines urology referral

## 2019-02-22 IMAGING — CT CT ANGIO HEAD
1 of 12 series · 5 of 33 positions shown · IV contrast (APPLIED)
Comparison: Head CT 11/15/2012

CLINICAL DATA: Neck pain.  Sudden pop in throat at work today.

EXAM:
CT ANGIOGRAPHY HEAD AND NECK
TECHNIQUE: Multidetector CT imaging of the head and neck was performed using
the standard protocol during bolus administration of intravenous
contrast. Multiplanar CT image reconstructions and MIPs were
obtained to evaluate the vascular anatomy. Carotid stenosis
measurements (when applicable) are obtained utilizing NASCET
criteria, using the distal internal carotid diameter as the
denominator.
CONTRAST:  50 cc Isovue 370 intravenous

[Series 13: ax thins · axial · 0.39mm/px · z∈[-326,-83]mm · 5 of 365 slices shown]
[im 61/365  soft-tissue]
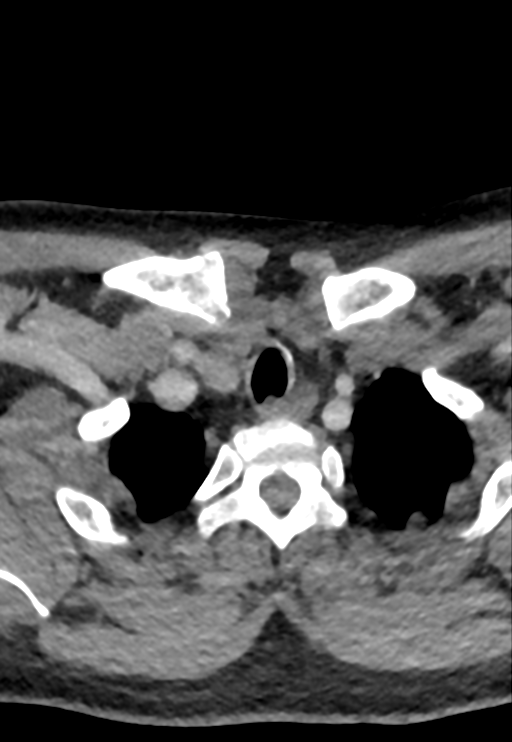
[im 122/365  bone]
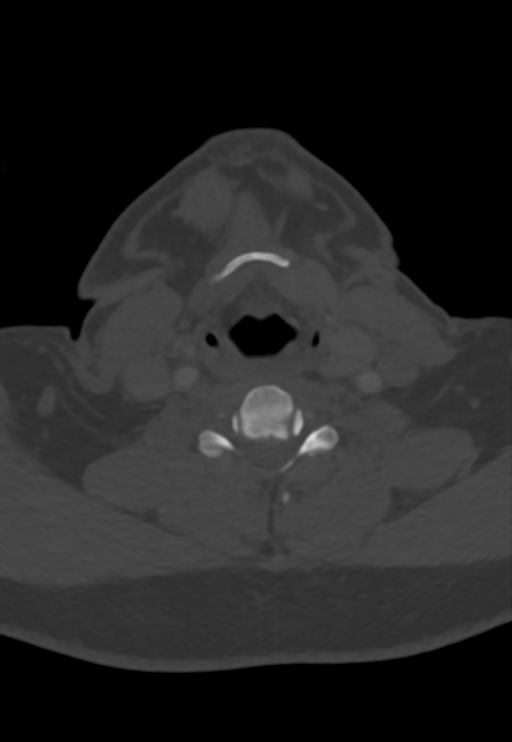
[im 183/365  soft-tissue]
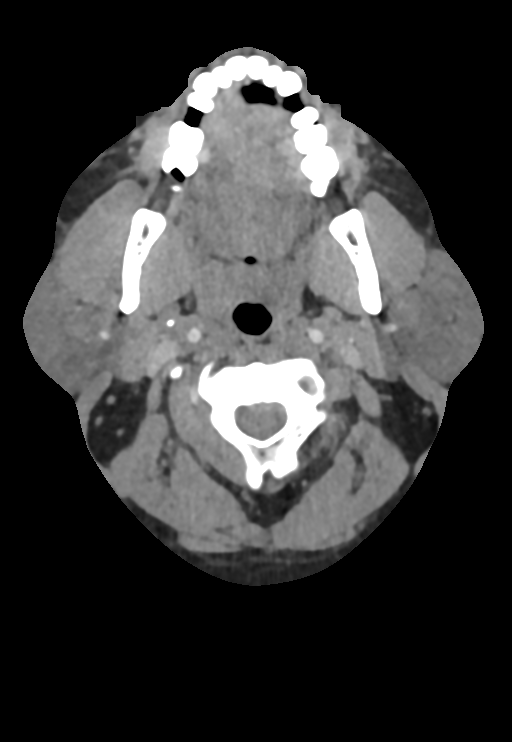
[im 243/365  bone]
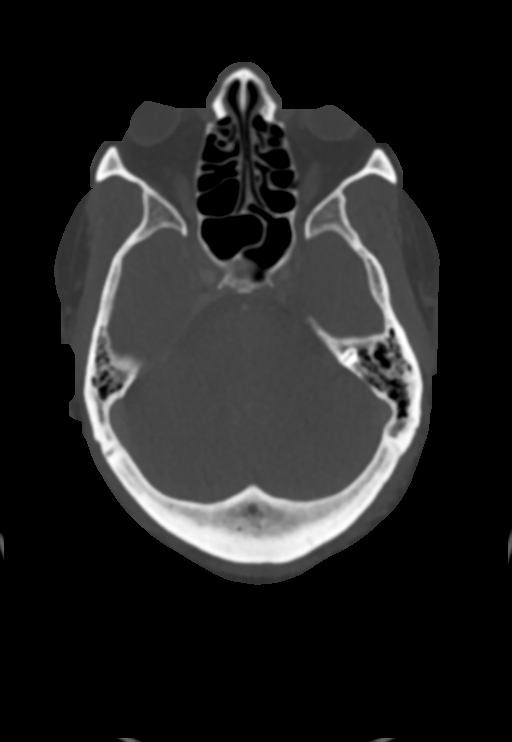
[im 304/365  soft-tissue]
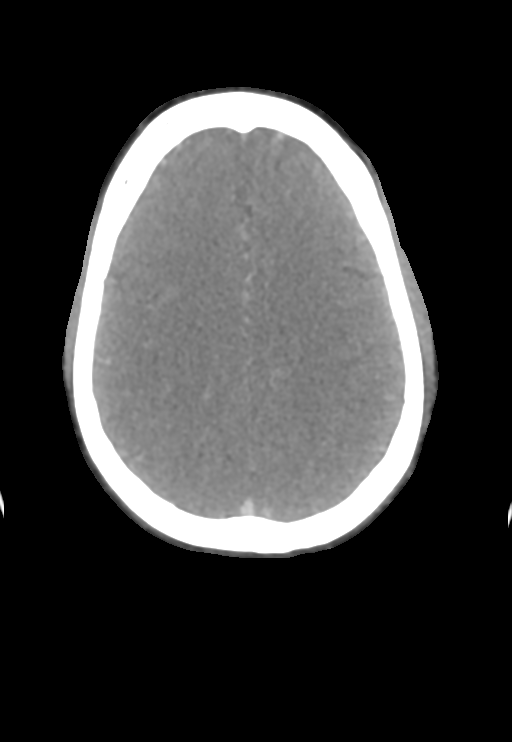

[5 of 33 positions shown; findings below may reference images not displayed]

FINDINGS: CT HEAD FINDINGS

Brain: No evidence of acute infarction, hemorrhage, hydrocephalus,
extra-axial collection or mass lesion/mass effect. Suspect mild
patchy low-density in the cerebral white matter, likely chronic
microvascular ischemia in this hypertensive patient.

Vascular: See below

Skull: No acute or aggressive finding

Sinuses: Negative

Orbits: Negative

Review of the MIP images confirms the above findings

CTA NECK FINDINGS

Aortic arch: Atheromatous wall thickening, noncalcified. Two vessel
branching.

Right carotid system: No noted atheromatous changes. No stenosis or
ulceration. Negative for dissection.

Left carotid system: No noted atheromatous changes. No stenosis or
dissection.

Vertebral arteries: Suboptimal contrast bolus, accentuated by
patient size, which particularly affects the small vertebral
arteries. Noncalcified atheromatous wall thickening of the proximal
left subclavian artery without stenosis. Tortuosity of the vertebral
arteries without suspected dissection or flow limiting stenosis.

Skeleton: Upper cervical disc degeneration with notable C3-4
uncovertebral spurring and right foraminal narrowing.

Other neck: No noted swelling or soft tissue gas. Symmetric
prominence of tonsillar tissue. Goiter.

Upper chest: Centrilobular emphysema. 4 mm right upper lobe
pulmonary nodule.

Review of the MIP images confirms the above findings

CTA HEAD FINDINGS

Anterior circulation: Mild hypoplasia of the left A1 segment.
Complete circle-of-Willis. No branch occlusion or flow limiting
stenosis. Negative for beading. No evidence of aneurysm.

Posterior circulation: Small vertebral arteries in the setting of
hypoplastic P1 segments. No branch occlusion or flow limiting
stenosis. Negative for aneurysm.

Venous sinuses: Patent

Anatomic variants: As above

Delayed phase: No abnormal intracranial enhancement.

Review of the MIP images confirms the above findings
IMPRESSION: 1. No acute finding.  No evidence of carotid dissection.
2. Aortic Atherosclerosis (197PA-0D7.7) and Emphysema (197PA-RMZ.F).
3. 4 mm right upper lobe pulmonary nodule, consider noncontrast
chest CT in 1 year.
4. Suspect mild chronic microvascular ischemia in the cerebral white
matter.
5. Goiter.

## 2019-03-05 ENCOUNTER — Telehealth: Payer: Self-pay

## 2019-03-05 NOTE — Telephone Encounter (Signed)
Company: [employer]  Document: Medical records request  Forwarded above request to HIM. Document and fax confirmation have been placed in the faxed file for future reference.

## 2019-03-07 ENCOUNTER — Other Ambulatory Visit: Payer: Self-pay | Admitting: Internal Medicine

## 2019-04-07 ENCOUNTER — Other Ambulatory Visit: Payer: Self-pay | Admitting: Internal Medicine

## 2019-04-07 NOTE — Telephone Encounter (Signed)
Please refill as per office routine med refill policy (all routine meds refilled for 3 mo or monthly per pt preference up to one year from last visit, then month to month grace period for 3 mo, then further med refills will have to be denied)  

## 2019-04-09 ENCOUNTER — Telehealth: Payer: Self-pay

## 2019-04-09 NOTE — Telephone Encounter (Signed)
Pt presented to office and dropped off unopened disability forms from [employer]. According to post-it note, pt requested the originals be returned to him during his appt on 04/20/19. Also requested forms be completed and faxed or emailed to [facility]. Dr. Loanne Drilling completed the provider portion of forms. However, we are unable to fax nor email forms d/t pt having not completed the following:  - Signed HIPAA form with [employer] advising release of his medical information - all sections of the packet are needing to be completed by the patient himself. Since pt provided an unopened envelop containing UNSIGNED and INCOMPLETE documents, we are not able to submit  Spoke with practice admin for further guidance. Agreed, since pt has not completed his portion of the forms NOR signed HIPAA form enclosed in envelop, documents could not be submitted.  Called pt to make him aware. However, pt VM is not set up.

## 2019-04-16 ENCOUNTER — Other Ambulatory Visit: Payer: Self-pay | Admitting: Endocrinology

## 2019-04-20 ENCOUNTER — Ambulatory Visit: Payer: Self-pay | Admitting: Endocrinology

## 2019-04-20 ENCOUNTER — Other Ambulatory Visit: Payer: Self-pay

## 2019-04-20 ENCOUNTER — Telehealth: Payer: Self-pay

## 2019-04-20 VITALS — BP 120/78 | HR 83 | Ht 72.0 in | Wt 240.0 lb

## 2019-04-20 DIAGNOSIS — E0590 Thyrotoxicosis, unspecified without thyrotoxic crisis or storm: Secondary | ICD-10-CM

## 2019-04-20 DIAGNOSIS — E119 Type 2 diabetes mellitus without complications: Secondary | ICD-10-CM

## 2019-04-20 LAB — TSH: TSH: 1.22 u[IU]/mL (ref 0.35–4.50)

## 2019-04-20 NOTE — Patient Instructions (Addendum)
Blood tests are requested for you today.  We'll let you know about the results.  If ever you have fever while taking methimazole, stop it and call us, even if the reason is obvious, because of the risk of a rare side-effect. It is best to never miss the medication.  However, if you do miss it, next best is to double up the next time.   Please come back for a follow-up appointment in 6 months.   
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
## 2019-04-20 NOTE — Telephone Encounter (Signed)
FAXED Gabriel. Coleman Document: [id number] Statement (a copy was made of this statement for scanning purposes and for our records. This document was then faxed to above mentioned company. BOTH the copy of the physician's statement and the fax confirmation were retained for our records. ORIGINAL physician's statement was given to pt)  NOTE: PATIENT FAILED TO COMPLETE HIS PORTION OF THESE FORMS. ALL FORMS (including original physician's statement) WERE RETURNED TO PT. Advised pt that he will need to complete his portion of these documents and submit them to the company listed above.

## 2019-04-22 ENCOUNTER — Telehealth: Payer: Self-pay

## 2019-04-22 NOTE — Telephone Encounter (Signed)
[facility] requesting medical records: [place] received: Signed medical records request  Forwarded above request to HIM. Document and fax confirmation have been placed in the faxed file for future reference.

## 2019-05-08 ENCOUNTER — Other Ambulatory Visit: Payer: Self-pay | Admitting: Internal Medicine

## 2019-05-10 ENCOUNTER — Other Ambulatory Visit: Payer: Self-pay | Admitting: Internal Medicine

## 2019-05-10 DIAGNOSIS — I10 Essential (primary) hypertension: Secondary | ICD-10-CM

## 2019-05-10 NOTE — Telephone Encounter (Signed)
Please refill as per office routine med refill policy (all routine meds refilled for 3 mo or monthly per pt preference up to one year from last visit, then month to month grace period for 3 mo, then further med refills will have to be denied)  

## 2019-05-10 NOTE — Telephone Encounter (Signed)
New message:  1.Medication Requested: amLODipine (NORVASC) 5 MG tablet losartan (COZAAR) 100 MG tablet 2. Pharmacy (Name, Street, [place]): [place] [place] 3. On Med List: Yes  4. Last Visit with PCP: 01/05/19  5. Next visit date with PCP: None scheduled at this time   Agent: Please be advised that RX refills may take up to 3 business days. We ask that you follow-up with your pharmacy.

## 2019-05-11 ENCOUNTER — Other Ambulatory Visit: Payer: Self-pay | Admitting: Internal Medicine

## 2019-05-11 DIAGNOSIS — I10 Essential (primary) hypertension: Secondary | ICD-10-CM

## 2019-05-11 MED ORDER — LOSARTAN POTASSIUM 100 MG PO TABS: 100.0000 mg | ORAL_TABLET | Freq: Every day | ORAL | 0 refills | Status: DC

## 2019-05-11 MED ORDER — AMLODIPINE BESYLATE 5 MG PO TABS: ORAL_TABLET | ORAL | 0 refills | Status: DC

## 2019-05-11 NOTE — Telephone Encounter (Signed)
Per office policy sent 30 day to local pharmacy until appt.../lmb  

## 2019-05-11 NOTE — Telephone Encounter (Signed)
Please refill as per office routine med refill policy (all routine meds refilled for 3 mo or monthly per pt preference up to one year from last visit, then month to month grace period for 3 mo, then further med refills will have to be denied)  

## 2019-05-15 ENCOUNTER — Ambulatory Visit (HOSPITAL_COMMUNITY)
Admission: EM | Admit: 2019-05-15 | Discharge: 2019-05-15 | Disposition: A | Discharge status: Home / Self Care | Payer: Self-pay | Attending: Emergency Medicine | Admitting: Emergency Medicine

## 2019-05-15 ENCOUNTER — Other Ambulatory Visit: Payer: Self-pay

## 2019-05-15 ENCOUNTER — Encounter (HOSPITAL_COMMUNITY): Payer: Self-pay | Admitting: *Deleted

## 2019-05-15 DIAGNOSIS — X503XXA Overexertion from repetitive movements, initial encounter: Secondary | ICD-10-CM

## 2019-05-15 DIAGNOSIS — M62838 Other muscle spasm: Secondary | ICD-10-CM

## 2019-05-15 MED ORDER — IBUPROFEN 600 MG PO TABS: 600.0000 mg | ORAL_TABLET | Freq: Four times a day (QID) | ORAL | 0 refills | Status: DC | PRN

## 2019-05-15 MED ORDER — TIZANIDINE HCL 4 MG PO TABS: 4.0000 mg | ORAL_TABLET | Freq: Three times a day (TID) | ORAL | 0 refills | Status: DC | PRN

## 2019-05-15 NOTE — Discharge Instructions (Signed)
Zanaflex as directed for muscle spasms, ibuprofen 600 mg combined with 1000 mg of Tylenol 3-4 times a day as needed for pain.  The ibuprofen will also help with the inflammation.  Continue massage if feasible.  You can try lying on a lacrosse or other hard plastic ball.  If this is too much, then try lying on a tennis ball in the area of the greatest pain to help release it.

## 2019-05-15 NOTE — ED Triage Notes (Signed)
Reports doing heavy activity of raking leaves 2 wks ago; states the next day started with left middle and upper back pain along with left posterior neck pain.  Has tried professional massage without relief.  States pain worse with any movement; relieved only when laying in certain position.  Denies parasthesias.  BUE CMS intact.
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
## 2019-05-19 ENCOUNTER — Encounter: Payer: Self-pay | Admitting: Family

## 2019-05-19 ENCOUNTER — Other Ambulatory Visit: Payer: Self-pay

## 2019-05-19 ENCOUNTER — Ambulatory Visit (INDEPENDENT_AMBULATORY_CARE_PROVIDER_SITE_OTHER): Payer: Self-pay | Admitting: Family

## 2019-05-19 VITALS — BP 140/90 | HR 78 | Temp 98.2°F | Ht 72.0 in | Wt 238.0 lb

## 2019-05-19 DIAGNOSIS — M62838 Other muscle spasm: Secondary | ICD-10-CM

## 2019-05-19 MED ORDER — PREDNISONE 20 MG PO TABS: 20.0000 mg | ORAL_TABLET | Freq: Every day | ORAL | 0 refills | Status: DC
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
## 2019-06-21 ENCOUNTER — Other Ambulatory Visit: Payer: Self-pay | Admitting: Internal Medicine

## 2019-06-21 NOTE — Telephone Encounter (Signed)
Please refill as per office routine med refill policy (all routine meds refilled for 3 mo or monthly per pt preference up to one year from last visit, then month to month grace period for 3 mo, then further med refills will have to be denied)  

## 2019-06-22 ENCOUNTER — Other Ambulatory Visit: Payer: Self-pay | Admitting: Internal Medicine

## 2019-06-22 NOTE — Telephone Encounter (Signed)
Please refill as per office routine med refill policy (all routine meds refilled for 3 mo or monthly per pt preference up to one year from last visit, then month to month grace period for 3 mo, then further med refills will have to be denied)  

## 2019-06-24 ENCOUNTER — Other Ambulatory Visit: Payer: Self-pay | Admitting: Internal Medicine

## 2019-07-06 ENCOUNTER — Ambulatory Visit: Payer: Self-pay | Admitting: Internal Medicine

## 2019-07-06 ENCOUNTER — Other Ambulatory Visit: Payer: Self-pay

## 2019-07-06 ENCOUNTER — Other Ambulatory Visit: Payer: Self-pay | Admitting: Internal Medicine

## 2019-07-06 MED ORDER — METFORMIN HCL ER 500 MG PO TB24: 500.0000 mg | ORAL_TABLET | Freq: Every day | ORAL | 0 refills | Status: DC

## 2019-07-06 MED ORDER — ROSUVASTATIN CALCIUM 20 MG PO TABS: ORAL_TABLET | ORAL | 0 refills | Status: DC

## 2019-07-06 NOTE — Telephone Encounter (Signed)
New Message:  Pt states he will be completely out of these two medications as of Friday.  1.Medication Requested: metFORMIN (GLUCOPHAGE-XR) 500 MG 24 hr tablet rosuvastatin (CRESTOR) 20 MG tablet 2. Pharmacy (Name, Street, [place]): [facility] [place] 3. On Med List: yes  4. Last Visit with PCP: 01/05/19  5. Next visit date with PCP: 07/19/19   Agent: Please be advised that RX refills may take up to 3 business days. We ask that you follow-up with your pharmacy.

## 2019-07-06 NOTE — Telephone Encounter (Signed)
30 day supply sent in for patient today. Must keep appointment this month for any future refills.

## 2019-07-19 ENCOUNTER — Encounter: Payer: Self-pay | Admitting: Internal Medicine

## 2019-07-19 ENCOUNTER — Other Ambulatory Visit: Payer: Self-pay | Admitting: Internal Medicine

## 2019-07-19 ENCOUNTER — Ambulatory Visit (INDEPENDENT_AMBULATORY_CARE_PROVIDER_SITE_OTHER): Payer: 59 | Admitting: Internal Medicine

## 2019-07-19 ENCOUNTER — Other Ambulatory Visit: Payer: Self-pay

## 2019-07-19 VITALS — BP 130/82 | HR 76 | Temp 99.2°F | Ht 72.0 in | Wt 232.0 lb

## 2019-07-19 DIAGNOSIS — Z23 Encounter for immunization: Secondary | ICD-10-CM | POA: Diagnosis not present

## 2019-07-19 DIAGNOSIS — K5900 Constipation, unspecified: Secondary | ICD-10-CM

## 2019-07-19 DIAGNOSIS — E538 Deficiency of other specified B group vitamins: Secondary | ICD-10-CM

## 2019-07-19 DIAGNOSIS — E559 Vitamin D deficiency, unspecified: Secondary | ICD-10-CM

## 2019-07-19 DIAGNOSIS — E119 Type 2 diabetes mellitus without complications: Secondary | ICD-10-CM

## 2019-07-19 DIAGNOSIS — I10 Essential (primary) hypertension: Secondary | ICD-10-CM

## 2019-07-19 DIAGNOSIS — Z0001 Encounter for general adult medical examination with abnormal findings: Secondary | ICD-10-CM | POA: Diagnosis not present

## 2019-07-19 DIAGNOSIS — R310 Gross hematuria: Secondary | ICD-10-CM | POA: Diagnosis not present

## 2019-07-19 LAB — LIPID PANEL
Cholesterol: 144 mg/dL (ref 0–200)
HDL: 36.7 mg/dL — ABNORMAL LOW (ref >39.00)
LDL Cholesterol: 70 mg/dL (ref 0–99)
NonHDL: 107.09
Total CHOL/HDL Ratio: 4
Triglycerides: 187 mg/dL — ABNORMAL HIGH (ref 0.0–149.0)
VLDL: 37.4 mg/dL (ref 0.0–40.0)

## 2019-07-19 LAB — CBC WITH DIFFERENTIAL/PLATELET
Basophils Absolute: 0 10*3/uL (ref 0.0–0.1)
Basophils Relative: 0.2 % (ref 0.0–3.0)
Eosinophils Absolute: 0.1 10*3/uL (ref 0.0–0.7)
Eosinophils Relative: 1.4 % (ref 0.0–5.0)
HCT: 44.9 % (ref 39.0–52.0)
Hemoglobin: 15.2 g/dL (ref 13.0–17.0)
Lymphocytes Relative: 34.7 % (ref 12.0–46.0)
Lymphs Abs: 2.9 10*3/uL (ref 0.7–4.0)
MCHC: 33.8 g/dL (ref 30.0–36.0)
MCV: 88.6 fl (ref 78.0–100.0)
Monocytes Absolute: 0.7 10*3/uL (ref 0.1–1.0)
Monocytes Relative: 8.5 % (ref 3.0–12.0)
Neutro Abs: 4.7 10*3/uL (ref 1.4–7.7)
Neutrophils Relative %: 55.2 % (ref 43.0–77.0)
Platelets: 251 10*3/uL (ref 150.0–400.0)
RBC: 5.07 Mil/uL (ref 4.22–5.81)
RDW: 13.4 % (ref 11.5–15.5)
WBC: 8.5 10*3/uL (ref 4.0–10.5)

## 2019-07-19 LAB — BASIC METABOLIC PANEL
BUN: 11 mg/dL (ref 6–23)
CO2: 26 mEq/L (ref 19–32)
Calcium: 10.2 mg/dL (ref 8.4–10.5)
Chloride: 102 mEq/L (ref 96–112)
Creatinine, Ser: 0.85 mg/dL (ref 0.40–1.50)
GFR: 109.99 mL/min (ref >60.00)
Glucose, Bld: 92 mg/dL (ref 70–99)
Potassium: 4.2 mEq/L (ref 3.5–5.1)
Sodium: 137 mEq/L (ref 135–145)

## 2019-07-19 LAB — VITAMIN D 25 HYDROXY (VIT D DEFICIENCY, FRACTURES): VITD: 53.82 ng/mL (ref 30.00–100.00)

## 2019-07-19 LAB — HEPATIC FUNCTION PANEL
ALT: 17 U/L (ref 0–53)
AST: 15 U/L (ref 0–37)
Albumin: 4.6 g/dL (ref 3.5–5.2)
Alkaline Phosphatase: 48 U/L (ref 39–117)
Bilirubin, Direct: 0.1 mg/dL (ref 0.0–0.3)
Total Bilirubin: 0.3 mg/dL (ref 0.2–1.2)
Total Protein: 7.5 g/dL (ref 6.0–8.3)

## 2019-07-19 LAB — HEMOGLOBIN A1C: Hgb A1c MFr Bld: 7.2 % — ABNORMAL HIGH (ref 4.6–6.5)

## 2019-07-19 LAB — MICROALBUMIN / CREATININE URINE RATIO
Creatinine,U: 138.8 mg/dL
Microalb Creat Ratio: 17 mg/g (ref 0.0–30.0)
Microalb, Ur: 23.6 mg/dL — ABNORMAL HIGH (ref 0.0–1.9)

## 2019-07-19 LAB — PSA: PSA: 1.03 ng/mL (ref 0.10–4.00)

## 2019-07-19 LAB — VITAMIN B12: Vitamin B-12: 990 pg/mL — ABNORMAL HIGH (ref 211–911)

## 2019-07-19 LAB — TSH: TSH: 1.02 u[IU]/mL (ref 0.35–4.50)

## 2019-07-19 NOTE — Patient Instructions (Signed)
You had the Prevnar 13 pneumonia shot today  Please continue all other medications as before, and refills have been done if requested.  Please have the pharmacy call with any other refills you may need.  Please continue your efforts at being more active, low cholesterol diet, and weight control.  You are otherwise up to date with prevention measures today.  Please keep your appointments with your specialists as you may have planned  Please use the miralax daily as needed for constipation  You will be contacted regarding the referral for: urology, gastroenterology for the colonoscopy, and eye doctor  Please consider seeing Sports medicine on the first floor this building for the right knee  Please go to the LAB at the blood drawing area for the tests to be done  You will be contacted by phone if any changes need to be made immediately.  Otherwise, you will receive a letter about your results with an explanation, but please check with MyChart first.  Please remember to sign up for MyChart if you have not done so, as this will be important to you in the future with finding out test results, communicating by private email, and scheduling acute appointments online when needed.  Please make an Appointment to return in 6 months, or sooner if needed

## 2019-07-19 NOTE — Assessment & Plan Note (Addendum)
Etiology unclear, for ua with labs, and urology referral  I spent 31 minutes in addition to time for CPX wellness examination in preparing to see the patient by review of recent labs, imaging and procedures, obtaining and reviewing separately obtained history, communicating with the patient and family or caregiver, ordering medications, tests or procedures, and documenting clinical information in the EHR including the differential Dx, treatment, and any further evaluation and other management of gross hematuria, constipation, dM, htn, vit d def
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
## 2019-07-19 NOTE — Assessment & Plan Note (Signed)
stable overall by history and exam, recent data reviewed with pt, and pt to continue medical treatment as before,  to f/u any worsening symptoms or concerns  

## 2019-07-19 NOTE — Assessment & Plan Note (Signed)
For miralax prn, stable overall by history and exam, recent data reviewed with pt, and pt to continue medical treatment as before,  to f/u any worsening symptoms or concerns

## 2019-07-19 NOTE — Assessment & Plan Note (Signed)
Cont oral replacement, stable overall by history and exam, recent data reviewed with pt, and pt to continue medical treatment as before,  to f/u any worsening symptoms or concerns

## 2019-07-19 NOTE — Telephone Encounter (Signed)
Please refill as per office routine med refill policy (all routine meds refilled for 3 mo or monthly per pt preference up to one year from last visit, then month to month grace period for 3 mo, then further med refills will have to be denied)  

## 2019-07-19 NOTE — Assessment & Plan Note (Addendum)

## 2019-07-20 LAB — URINALYSIS, ROUTINE W REFLEX MICROSCOPIC
Bilirubin Urine: NEGATIVE
Ketones, ur: NEGATIVE
Leukocytes,Ua: NEGATIVE
Nitrite: NEGATIVE
Specific Gravity, Urine: 1.02 (ref 1.000–1.030)
Total Protein, Urine: 30 — AB
Urine Glucose: NEGATIVE
Urobilinogen, UA: 0.2 (ref 0.0–1.0)
pH: 8 (ref 5.0–8.0)

## 2019-07-21 ENCOUNTER — Encounter: Payer: Self-pay | Admitting: Gastroenterology

## 2019-07-22 ENCOUNTER — Encounter: Payer: Self-pay | Admitting: Internal Medicine

## 2019-08-02 ENCOUNTER — Encounter (HOSPITAL_COMMUNITY): Payer: Self-pay | Admitting: Emergency Medicine

## 2019-08-02 ENCOUNTER — Emergency Department (HOSPITAL_COMMUNITY): Payer: 59

## 2019-08-02 ENCOUNTER — Emergency Department (HOSPITAL_COMMUNITY)
Admission: EM | Admit: 2019-08-02 | Discharge: 2019-08-02 | Disposition: A | Discharge status: Home / Self Care | Payer: 59 | Attending: Emergency Medicine | Admitting: Emergency Medicine

## 2019-08-02 ENCOUNTER — Other Ambulatory Visit: Payer: Self-pay | Admitting: Internal Medicine

## 2019-08-02 ENCOUNTER — Other Ambulatory Visit: Payer: Self-pay

## 2019-08-02 DIAGNOSIS — J449 Chronic obstructive pulmonary disease, unspecified: Secondary | ICD-10-CM | POA: Insufficient documentation

## 2019-08-02 DIAGNOSIS — Z7982 Long term (current) use of aspirin: Secondary | ICD-10-CM | POA: Insufficient documentation

## 2019-08-02 DIAGNOSIS — N200 Calculus of kidney: Secondary | ICD-10-CM

## 2019-08-02 DIAGNOSIS — Z7984 Long term (current) use of oral hypoglycemic drugs: Secondary | ICD-10-CM | POA: Insufficient documentation

## 2019-08-02 DIAGNOSIS — Z79899 Other long term (current) drug therapy: Secondary | ICD-10-CM | POA: Insufficient documentation

## 2019-08-02 DIAGNOSIS — I10 Essential (primary) hypertension: Secondary | ICD-10-CM | POA: Diagnosis not present

## 2019-08-02 DIAGNOSIS — E119 Type 2 diabetes mellitus without complications: Secondary | ICD-10-CM | POA: Diagnosis not present

## 2019-08-02 DIAGNOSIS — Z87891 Personal history of nicotine dependence: Secondary | ICD-10-CM | POA: Diagnosis not present

## 2019-08-02 DIAGNOSIS — N132 Hydronephrosis with renal and ureteral calculous obstruction: Secondary | ICD-10-CM | POA: Insufficient documentation

## 2019-08-02 DIAGNOSIS — R1032 Left lower quadrant pain: Secondary | ICD-10-CM | POA: Diagnosis present

## 2019-08-02 LAB — URINALYSIS, ROUTINE W REFLEX MICROSCOPIC
Bilirubin Urine: NEGATIVE
Glucose, UA: NEGATIVE mg/dL
Ketones, ur: NEGATIVE mg/dL
Nitrite: NEGATIVE
Protein, ur: NEGATIVE mg/dL
Specific Gravity, Urine: 1.032 — ABNORMAL HIGH (ref 1.005–1.030)
pH: 5 (ref 5.0–8.0)

## 2019-08-02 LAB — COMPREHENSIVE METABOLIC PANEL
ALT: 20 U/L (ref 0–44)
AST: 20 U/L (ref 15–41)
Albumin: 4.1 g/dL (ref 3.5–5.0)
Alkaline Phosphatase: 45 U/L (ref 38–126)
Anion gap: 10 (ref 5–15)
BUN: 15 mg/dL (ref 8–23)
CO2: 22 mmol/L (ref 22–32)
Calcium: 9.4 mg/dL (ref 8.9–10.3)
Chloride: 106 mmol/L (ref 98–111)
Creatinine, Ser: 1.25 mg/dL — ABNORMAL HIGH (ref 0.61–1.24)
GFR calc Af Amer: >60 mL/min (ref >60)
GFR calc non Af Amer: >60 mL/min (ref >60)
Glucose, Bld: 153 mg/dL — ABNORMAL HIGH (ref 70–99)
Potassium: 4 mmol/L (ref 3.5–5.1)
Sodium: 138 mmol/L (ref 135–145)
Total Bilirubin: 0.5 mg/dL (ref 0.3–1.2)
Total Protein: 7.2 g/dL (ref 6.5–8.1)

## 2019-08-02 LAB — CBC
HCT: 45.4 % (ref 39.0–52.0)
Hemoglobin: 14.8 g/dL (ref 13.0–17.0)
MCH: 29.7 pg (ref 26.0–34.0)
MCHC: 32.6 g/dL (ref 30.0–36.0)
MCV: 91 fL (ref 80.0–100.0)
Platelets: 233 10*3/uL (ref 150–400)
RBC: 4.99 MIL/uL (ref 4.22–5.81)
RDW: 12.3 % (ref 11.5–15.5)
WBC: 13.2 10*3/uL — ABNORMAL HIGH (ref 4.0–10.5)
nRBC: 0 % (ref 0.0–0.2)

## 2019-08-02 LAB — LIPASE, BLOOD: Lipase: 97 U/L — ABNORMAL HIGH (ref 11–51)

## 2019-08-02 MED ORDER — MORPHINE SULFATE (PF) 4 MG/ML IV SOLN
4.0000 mg | Freq: Once | INTRAVENOUS | Status: AC
  Administered 2019-08-02: 4 mg via INTRAVENOUS
  Filled 2019-08-02: qty 1

## 2019-08-02 MED ORDER — SODIUM CHLORIDE 0.9% FLUSH: 3.0000 mL | Freq: Once | INTRAVENOUS | Status: DC

## 2019-08-02 MED ORDER — ACETAMINOPHEN 500 MG PO TABS
500.0000 mg | ORAL_TABLET | Freq: Four times a day (QID) | ORAL | 0 refills | Status: DC | PRN
Start: 2019-08-02 — End: 2020-07-04

## 2019-08-02 MED ORDER — IOHEXOL 300 MG/ML  SOLN
100.0000 mL | Freq: Once | INTRAMUSCULAR | Status: AC | PRN
  Administered 2019-08-02: 100 mL via INTRAVENOUS

## 2019-08-02 MED ORDER — ONDANSETRON HCL 4 MG/2ML IJ SOLN
4.0000 mg | Freq: Once | INTRAMUSCULAR | Status: AC
  Administered 2019-08-02: 4 mg via INTRAVENOUS
  Filled 2019-08-02: qty 2

## 2019-08-02 MED ORDER — TAMSULOSIN HCL 0.4 MG PO CAPS: 0.4000 mg | ORAL_CAPSULE | Freq: Every day | ORAL | 0 refills | Status: DC

## 2019-08-02 NOTE — ED Notes (Signed)
Pt SpO2 dropped while sleeping to 88%Ra with perfect wave form. Pt endorses COPD and sleep apnea. Pt denies SOB at this time. Gertie Fey PA made aware. No supplemental O2 needed at this time.

## 2019-08-02 NOTE — ED Notes (Signed)
Pt reports he all of a sudden became nauseated, vomiting in triage.

## 2019-08-02 NOTE — ED Triage Notes (Signed)
Pt reports LLQ pain with some discomfort in his back, he denies urinary symptoms, diarrhea or vomiting, states that he has been constipated about 1 week, reports he was straining really hard to have a BM last night and pain started afterwards.

## 2019-08-02 NOTE — Discharge Instructions (Signed)
Your abdominal pain is likely due to a recently passed kidney stone.  You also have another stone in your left kidney approximately 7 mm in diameter but it is currently nonobstructive.  Take Tylenol as needed for pain, drink plenty of fluid, use Flomax as prescribed.  Follow-up with urology as needed.
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
## 2019-08-05 DIAGNOSIS — R319 Hematuria, unspecified: Secondary | ICD-10-CM

## 2019-08-05 HISTORY — DX: Hematuria, unspecified: R31.9

## 2019-09-01 ENCOUNTER — Ambulatory Visit (AMBULATORY_SURGERY_CENTER): Payer: Self-pay

## 2019-09-01 ENCOUNTER — Other Ambulatory Visit: Payer: Self-pay

## 2019-09-01 ENCOUNTER — Encounter (HOSPITAL_BASED_OUTPATIENT_CLINIC_OR_DEPARTMENT_OTHER): Payer: Self-pay | Admitting: Urology

## 2019-09-01 ENCOUNTER — Other Ambulatory Visit: Payer: Self-pay | Admitting: Urology

## 2019-09-01 VITALS — Ht 72.0 in | Wt 232.6 lb

## 2019-09-01 DIAGNOSIS — Z1211 Encounter for screening for malignant neoplasm of colon: Secondary | ICD-10-CM

## 2019-09-01 MED ORDER — NA SULFATE-K SULFATE-MG SULF 17.5-3.13-1.6 GM/177ML PO SOLN: 1.0000 | Freq: Once | ORAL | 0 refills | Status: AC

## 2019-09-01 NOTE — Progress Notes (Addendum)
Spoke w/ via phone for pre-op interview---wife  Gaynelle Lab needs dos----    I stat 8 8, ekg            Lab results------pumonary function test 03-24-2017 epic COVID test ------09-03-2019 at 200 pm Arrive at -------530 am 09-07-2019 NPO after MN NO Solid Food.  Clear liquids from MN until---430 am then npo Medications to take morning of surgery ----- albuterol inhaler prn/bring inhaler, symbicort, rosuvastatin, amlodipine Diabetic medication -----none day of surgery Patient Special Instructions -----none Pre-Op special Istructions -----none Patient verbalized understanding of instructions that were given at this phone interview. Patient denies shortness of breath, chest pain, fever, cough a this phone interview.  Addendum: patient to stay on 81 mg aspirin per dr pace last dose day before surgery

## 2019-09-01 NOTE — Progress Notes (Signed)
No allergies to soy or egg Pt is not on blood thinners or diet pills Denies issues with sedation/intubation Denies atrial flutter/fib Denies constipation   Pt is aware of Covid safety and care partner requirements.      

## 2019-09-01 NOTE — Progress Notes (Signed)
   09/01/19 1057  OBSTRUCTIVE SLEEP APNEA  Have you ever been diagnosed with sleep apnea through a sleep study? No  Do you snore loudly (loud enough to be heard through closed doors)?  1  Do you often feel tired, fatigued, or sleepy during the daytime (such as falling asleep during driving or talking to someone)? 1  Has anyone observed you stop breathing during your sleep? 1  Do you have, or are you being treated for high blood pressure? 1  BMI more than 35 kg/m2? 0  Age > 70 (1-yes) 1  Male Gender (Yes=1) 1  Obstructive Sleep Apnea Score 6  Score 5 or greater  Results sent to PCP

## 2019-09-03 ENCOUNTER — Other Ambulatory Visit (HOSPITAL_COMMUNITY)
Admission: RE | Admit: 2019-09-03 | Discharge: 2019-09-03 | Disposition: A | Discharge status: Home / Self Care | Payer: 59 | Source: Ambulatory Visit | Attending: Urology | Admitting: Urology

## 2019-09-03 DIAGNOSIS — Z01812 Encounter for preprocedural laboratory examination: Secondary | ICD-10-CM | POA: Insufficient documentation

## 2019-09-03 DIAGNOSIS — Z20822 Contact with and (suspected) exposure to covid-19: Secondary | ICD-10-CM | POA: Insufficient documentation

## 2019-09-03 LAB — SARS CORONAVIRUS 2 (TAT 6-24 HRS): SARS Coronavirus 2: NEGATIVE

## 2019-09-06 NOTE — H&P (View-Only) (Signed)
CC/HPI: cc: kidney stones, left testicular pain, ED   08/30/19: 64 year old man presents to PCP with gross hematuria and may who then developed left flank pain found to have a 7 mm left renal pelvis stone on 08/02/2019. This was patient's 1st kidney stone and he no longer has pain but is unsure if E passed anything. Patient also has a history of erectile dysfunction and previously saw Dr. Gaynelle Arabian. He was doing intracavernosal injections with mixed results. Patient also complains of left testicular pain with long periods of standing. He often times has to sit down after gardening for while of the P who pretty quit food review.     ALLERGIES: Chocolate - Hives, Skin Rash    MEDICATIONS: Albuterol Sulfate  Amlodipine Besylate 5 mg tablet  Aspirin Ec 81 mg tablet, delayed release  Budesonide-Formoterol Fumarate  Cholecalciferol  Gabapentin 009 mg capsule  Garlic 2,330 mg capsule  Losartan Potassium 100 mg tablet  Magnesium 250 mg tablet  Metformin Hcl Er 500 mg tablet, extended release 24 hr  Methimazole 5 mg tablet  Rosuvastatin Calcium 20 mg tablet  Sildenafil Citrate 20 mg tablet  Triamcinolone  Trimix (PGE 40Mcg, PAP 30, PHE 1) Inject 0.88ml to 0.46ml as directed, but no more than 3 x per week  Tums  Turmeric     GU PSH: Penile Injection - 2018     NON-GU PSH: None   GU PMH: ED due to arterial insufficiency - 2018, - 2018, - 2018, - 2018 BPH w/LUTS - 2018, - 2018 Urinary Frequency - 2018    NON-GU PMH: Other psychoactive substance abuse, uncomplicated - 0762 COPD GERD Gout Hepatitis B Hepatitis C Hypercholesterolemia Hypertension Liver Disease Personal history of other endocrine, nutritional and metabolic disease Sleep Apnea    FAMILY HISTORY: Alcohol abuse - Father Deceased - Father Diabetes - Mother Esophageal Cancer - Father Heart Disease - Mother nephrolithiasis - Mother renal failure - Mother   SOCIAL HISTORY: Marital Status: Married Preferred  Language: English; Ethnicity: Not Hispanic Or Latino; Race: Black or African American Current Smoking Status: Patient does not smoke anymore. Has not smoked since 04/16/2015. Smoked for 40 years. Smoked 1 pack per day.   Tobacco Use Assessment Completed: Used Tobacco in last 30 days? Social Drinker.  Drinks 1 caffeinated drink per day. Patient's occupation [employer].    REVIEW OF SYSTEMS:    GU Review Male:   Patient reports frequent urination and get up at night to urinate. Patient denies hard to postpone urination, burning/ pain with urination, leakage of urine, stream starts and stops, trouble starting your stream, have to strain to urinate , erection problems, and penile pain.  Gastrointestinal (Upper):   Patient reports indigestion/ heartburn. Patient denies nausea and vomiting.  Gastrointestinal (Lower):   Patient reports constipation. Patient denies diarrhea.  Constitutional:   Patient reports fatigue. Patient denies fever, night sweats, and weight loss.  Skin:   Patient denies skin rash/ lesion and itching.  Eyes:   Patient denies blurred vision and double vision.  Ears/ Nose/ Throat:   Patient denies sore throat and sinus problems.  Hematologic/Lymphatic:   Patient denies swollen glands and easy bruising.  Cardiovascular:   Patient denies leg swelling and chest pains.  Respiratory:   Patient denies cough and shortness of breath.  Endocrine:   Patient denies excessive thirst.  Musculoskeletal:   Patient reports back pain. Patient denies joint pain.  Neurological:   Patient denies dizziness and headaches.  Psychologic:   Patient denies depression and  anxiety.   Notes: lower back pain     VITAL SIGNS:      08/30/2019 02:21 PM  Weight 225.6 lb / 102.33 kg  Height 72 in / 182.88 cm  BP 120/76 mmHg  Pulse 80 /min  Temperature 96.8 F / 36 C  BMI 30.6 kg/m   GU PHYSICAL EXAMINATION:    Scrotum: No lesions. No edema. No cysts. No warts.  Epididymides: Right: no  spermatocele, no masses, no cysts, no tenderness, no induration, no enlargement. Left: no spermatocele, no masses, no cysts, no tenderness, no induration, no enlargement.  Testes: Mod varicocele - Gr II left testis. No tenderness, no swelling, no enlargement left testis. No tenderness, no swelling, no enlargement right testis. Normal location left testis. Normal location right testis. No mass, no cyst, no hydrocele left testis. No mass, no cyst, no varicocele, no hydrocele right testis.   Urethral Meatus: Normal size. No lesion, no wart, no discharge, no polyp. Normal location.  Penis: Circumcised, no warts, no cracks. No dorsal Peyronie's plaques, no left corporal Peyronie's plaques, no right corporal Peyronie's plaques, no scarring, no warts. No balanitis, no meatal stenosis.   MULTI-SYSTEM PHYSICAL EXAMINATION:    Constitutional: Well-nourished. No physical deformities. Normally developed. Good grooming.  Neck: Neck symmetrical, not swollen. Normal tracheal position.  Respiratory: No labored breathing, no use of accessory muscles.   Cardiovascular: Normal temperature  Skin: No paleness, no jaundice, no cyanosis. No lesion, no ulcer, no rash.  Neurologic / Psychiatric: Oriented to time, oriented to place, oriented to person. No depression, no anxiety, no agitation.  Gastrointestinal: No rigidity, non obese abdomen.   Eyes: Normal conjunctivae. Normal eyelids.  Ears, Nose, Mouth, and Throat: Left ear no scars, no lesions, no masses. Right ear no scars, no lesions, no masses. Nose no scars, no lesions, no masses. Normal hearing. Normal lips.  Musculoskeletal: Normal gait and station of head and neck.     Complexity of Data:  Records Review:   POC Tool  Urine Test Review:   Urinalysis  X-Ray Review: KUB: Reviewed Films. Discussed With Patient. KUB obtained shows a calcification overlying the expected location of the left kidney.   Notes:                     08/02/2019: BUN 15, creatinine 1.25   07/19/2019: PSA 1.03   PROCEDURES:         KUB - 74018  A single view of the abdomen is obtained.      . Patient confirmed No Neulasta OnPro Device.          PVR Ultrasound - 92119  Scanned Volume: 100 cc         Urinalysis w/Scope Dipstick Dipstick Cont'd Micro  Color: Amber Bilirubin: Neg mg/dL WBC/hpf: 0 - 5/hpf  Appearance: Cloudy Ketones: Neg mg/dL RBC/hpf: >60/hpf  Specific Gravity: 1.025 Blood: 3+ ery/uL Bacteria: Rare (0-9/hpf)  pH: 6.0 Protein: 1+ mg/dL Cystals: NS (Not Seen)  Glucose: Neg mg/dL Urobilinogen: 0.2 mg/dL Casts: Hyaline    Nitrites: Neg Trichomonas: Not Present    Leukocyte Esterase: Trace leu/uL Mucous: Present      Epithelial Cells: 0 - 5/hpf      Yeast: NS (Not Seen)      Sperm: Not Present    ASSESSMENT:      ICD-10 Details  1 GU:   Renal calculus - N20.0 Left, Acute, Uncomplicated - Patient had a recent ER visit for left flank pain found to have a  7 mm left renal pelvis calculus. Patient continues to have microscopic hematuria on urinalysis today. Stone is clearly visible on KUB obtained in the office today. We discussed management options for the stone including observation, ESWL, and ureteroscopy with laser lithotripsy. Patient has elected to proceed with ESWL. We discussed the risks and benefits of the procedure including hematuria, infection, pain, need for staged intervention, hematoma, damage to the kidney, affects from lithotripsy on the kidney. He will be scheduled for the next available date.  2   ED due to arterial insufficiency - N52.01 Chronic, Stable - We also discussed management of erectile dysfunction and reviewed the interventions he has tried including PDE 5 inhibitors, vacuum erection device, intracavernosal injections. He is not currently getting any spontaneous nocturnal or a.m. erections. The intracavernosal injections are not working to satisfaction. He has asked about shockwave for ED which will address at his follow-up after ESWL.   3   Varicocele - I86.1 Left, Chronic, Stable - Patient with evidence of varicocele on left on physical exam today. We discussed intervention for this including observation, microscopic subinguinal varicocelectomy, and IR embolization. Patient would like to think about this and will talk but it at his follow-up after ESWL.

## 2019-09-06 NOTE — Anesthesia Preprocedure Evaluation (Addendum)
Anesthesia Evaluation  Patient identified by MRN, date of birth, ID band Patient awake    Reviewed: Allergy & Precautions, NPO status , Patient's Chart, lab work & pertinent test results  Airway Mallampati: II  TM Distance: >3 FB Neck ROM: Full    Dental  (+) Dental Advisory Given, Missing, Chipped, Poor Dentition,    Pulmonary shortness of breath, sleep apnea , COPD,  COPD inhaler, former smoker,    Pulmonary exam normal breath sounds clear to auscultation       Cardiovascular hypertension, Pt. on medications Normal cardiovascular exam Rhythm:Regular Rate:Normal  Myocardial perfusion scan 07/22/2017 1. EF 57%, normal wall motion.  2. Fixed small, mild basal inferior perfusion defect.  Given normal wall motion, suspect diaphragmatic attenuation.  No ischemia.   Low risk study.    Neuro/Psych PSYCHIATRIC DISORDERS Anxiety  Neuromuscular disease    GI/Hepatic GERD  Medicated and Controlled,(+) Hepatitis -, B, C  Endo/Other  diabetesHypothyroidism Hyperthyroidism   Renal/GU Renal disease     Musculoskeletal  (+) Arthritis , Osteoarthritis,  Scapulolunate dissociation of left wrist   Abdominal (+) + obese,   Peds  Hematology negative hematology ROS (+)   Anesthesia Other Findings   Reproductive/Obstetrics                            Anesthesia Physical  Anesthesia Plan  ASA: III  Anesthesia Plan: General   Post-op Pain Management:    Induction: Intravenous  PONV Risk Score and Plan: 3 and Ondansetron, Treatment may vary due to age or medical condition, Dexamethasone and Midazolam  Airway Management Planned: LMA  Additional Equipment: None  Intra-op Plan:   Post-operative Plan: Extubation in OR  Informed Consent: I have reviewed the patients History and Physical, chart, labs and discussed the procedure including the risks, benefits and alternatives for the proposed anesthesia with  the patient or authorized representative who has indicated his/her understanding and acceptance.     Dental advisory given  Plan Discussed with: CRNA  Anesthesia Plan Comments:        Anesthesia Quick Evaluation

## 2019-09-06 NOTE — H&P (Signed)
CC/HPI: cc: kidney stones, left testicular pain, ED   08/30/19: 64 year old man presents to PCP with gross hematuria and may who then developed left flank pain found to have a 7 mm left renal pelvis stone on 08/02/2019. This was patient's 1st kidney stone and he no longer has pain but is unsure if E passed anything. Patient also has a history of erectile dysfunction and previously saw Dr. Gaynelle Arabian. He was doing intracavernosal injections with mixed results. Patient also complains of left testicular pain with long periods of standing. He often times has to sit down after gardening for while of the P who pretty quit food review.     ALLERGIES: Chocolate - Hives, Skin Rash    MEDICATIONS: Albuterol Sulfate  Amlodipine Besylate 5 mg tablet  Aspirin Ec 81 mg tablet, delayed release  Budesonide-Formoterol Fumarate  Cholecalciferol  Gabapentin 073 mg capsule  Garlic 7,106 mg capsule  Losartan Potassium 100 mg tablet  Magnesium 250 mg tablet  Metformin Hcl Er 500 mg tablet, extended release 24 hr  Methimazole 5 mg tablet  Rosuvastatin Calcium 20 mg tablet  Sildenafil Citrate 20 mg tablet  Triamcinolone  Trimix (PGE 40Mcg, PAP 30, PHE 1) Inject 0.44ml to 0.36ml as directed, but no more than 3 x per week  Tums  Turmeric     GU PSH: Penile Injection - 2018     NON-GU PSH: None   GU PMH: ED due to arterial insufficiency - 2018, - 2018, - 2018, - 2018 BPH w/LUTS - 2018, - 2018 Urinary Frequency - 2018    NON-GU PMH: Other psychoactive substance abuse, uncomplicated - 2694 COPD GERD Gout Hepatitis B Hepatitis C Hypercholesterolemia Hypertension Liver Disease Personal history of other endocrine, nutritional and metabolic disease Sleep Apnea    FAMILY HISTORY: Alcohol abuse - Father Deceased - Father Diabetes - Mother Esophageal Cancer - Father Heart Disease - Mother nephrolithiasis - Mother renal failure - Mother   SOCIAL HISTORY: Marital Status: Married Preferred  Language: English; Ethnicity: Not Hispanic Or Latino; Race: Black or African American Current Smoking Status: Patient does not smoke anymore. Has not smoked since 04/16/2015. Smoked for 40 years. Smoked 1 pack per day.   Tobacco Use Assessment Completed: Used Tobacco in last 30 days? Social Drinker.  Drinks 1 caffeinated drink per day. Patient's occupation [employer].    REVIEW OF SYSTEMS:    GU Review Male:   Patient reports frequent urination and get up at night to urinate. Patient denies hard to postpone urination, burning/ pain with urination, leakage of urine, stream starts and stops, trouble starting your stream, have to strain to urinate , erection problems, and penile pain.  Gastrointestinal (Upper):   Patient reports indigestion/ heartburn. Patient denies nausea and vomiting.  Gastrointestinal (Lower):   Patient reports constipation. Patient denies diarrhea.  Constitutional:   Patient reports fatigue. Patient denies fever, night sweats, and weight loss.  Skin:   Patient denies skin rash/ lesion and itching.  Eyes:   Patient denies blurred vision and double vision.  Ears/ Nose/ Throat:   Patient denies sore throat and sinus problems.  Hematologic/Lymphatic:   Patient denies swollen glands and easy bruising.  Cardiovascular:   Patient denies leg swelling and chest pains.  Respiratory:   Patient denies cough and shortness of breath.  Endocrine:   Patient denies excessive thirst.  Musculoskeletal:   Patient reports back pain. Patient denies joint pain.  Neurological:   Patient denies dizziness and headaches.  Psychologic:   Patient denies depression and  anxiety.   Notes: lower back pain     VITAL SIGNS:      08/30/2019 02:21 PM  Weight 225.6 lb / 102.33 kg  Height 72 in / 182.88 cm  BP 120/76 mmHg  Pulse 80 /min  Temperature 96.8 F / 36 C  BMI 30.6 kg/m   GU PHYSICAL EXAMINATION:    Scrotum: No lesions. No edema. No cysts. No warts.  Epididymides: Right: no  spermatocele, no masses, no cysts, no tenderness, no induration, no enlargement. Left: no spermatocele, no masses, no cysts, no tenderness, no induration, no enlargement.  Testes: Mod varicocele - Gr II left testis. No tenderness, no swelling, no enlargement left testis. No tenderness, no swelling, no enlargement right testis. Normal location left testis. Normal location right testis. No mass, no cyst, no hydrocele left testis. No mass, no cyst, no varicocele, no hydrocele right testis.   Urethral Meatus: Normal size. No lesion, no wart, no discharge, no polyp. Normal location.  Penis: Circumcised, no warts, no cracks. No dorsal Peyronie's plaques, no left corporal Peyronie's plaques, no right corporal Peyronie's plaques, no scarring, no warts. No balanitis, no meatal stenosis.   MULTI-SYSTEM PHYSICAL EXAMINATION:    Constitutional: Well-nourished. No physical deformities. Normally developed. Good grooming.  Neck: Neck symmetrical, not swollen. Normal tracheal position.  Respiratory: No labored breathing, no use of accessory muscles.   Cardiovascular: Normal temperature  Skin: No paleness, no jaundice, no cyanosis. No lesion, no ulcer, no rash.  Neurologic / Psychiatric: Oriented to time, oriented to place, oriented to person. No depression, no anxiety, no agitation.  Gastrointestinal: No rigidity, non obese abdomen.   Eyes: Normal conjunctivae. Normal eyelids.  Ears, Nose, Mouth, and Throat: Left ear no scars, no lesions, no masses. Right ear no scars, no lesions, no masses. Nose no scars, no lesions, no masses. Normal hearing. Normal lips.  Musculoskeletal: Normal gait and station of head and neck.     Complexity of Data:  Records Review:   POC Tool  Urine Test Review:   Urinalysis  X-Ray Review: KUB: Reviewed Films. Discussed With Patient. KUB obtained shows a calcification overlying the expected location of the left kidney.   Notes:                     08/02/2019: BUN 15, creatinine 1.25   07/19/2019: PSA 1.03   PROCEDURES:         KUB - 74018  A single view of the abdomen is obtained.      . Patient confirmed No Neulasta OnPro Device.          PVR Ultrasound - 39767  Scanned Volume: 100 cc         Urinalysis w/Scope Dipstick Dipstick Cont'd Micro  Color: Amber Bilirubin: Neg mg/dL WBC/hpf: 0 - 5/hpf  Appearance: Cloudy Ketones: Neg mg/dL RBC/hpf: >60/hpf  Specific Gravity: 1.025 Blood: 3+ ery/uL Bacteria: Rare (0-9/hpf)  pH: 6.0 Protein: 1+ mg/dL Cystals: NS (Not Seen)  Glucose: Neg mg/dL Urobilinogen: 0.2 mg/dL Casts: Hyaline    Nitrites: Neg Trichomonas: Not Present    Leukocyte Esterase: Trace leu/uL Mucous: Present      Epithelial Cells: 0 - 5/hpf      Yeast: NS (Not Seen)      Sperm: Not Present    ASSESSMENT:      ICD-10 Details  1 GU:   Renal calculus - N20.0 Left, Acute, Uncomplicated - Patient had a recent ER visit for left flank pain found to have a  7 mm left renal pelvis calculus. Patient continues to have microscopic hematuria on urinalysis today. Stone is clearly visible on KUB obtained in the office today. We discussed management options for the stone including observation, ESWL, and ureteroscopy with laser lithotripsy. Patient has elected to proceed with ESWL. We discussed the risks and benefits of the procedure including hematuria, infection, pain, need for staged intervention, hematoma, damage to the kidney, affects from lithotripsy on the kidney. He will be scheduled for the next available date.  2   ED due to arterial insufficiency - N52.01 Chronic, Stable - We also discussed management of erectile dysfunction and reviewed the interventions he has tried including PDE 5 inhibitors, vacuum erection device, intracavernosal injections. He is not currently getting any spontaneous nocturnal or a.m. erections. The intracavernosal injections are not working to satisfaction. He has asked about shockwave for ED which will address at his follow-up after ESWL.   3   Varicocele - I86.1 Left, Chronic, Stable - Patient with evidence of varicocele on left on physical exam today. We discussed intervention for this including observation, microscopic subinguinal varicocelectomy, and IR embolization. Patient would like to think about this and will talk but it at his follow-up after ESWL.

## 2019-09-07 ENCOUNTER — Encounter (HOSPITAL_BASED_OUTPATIENT_CLINIC_OR_DEPARTMENT_OTHER)
Admission: RE | Disposition: A | Discharge status: Home / Self Care | Payer: Self-pay | Source: Home / Self Care | Attending: Urology

## 2019-09-07 ENCOUNTER — Other Ambulatory Visit: Payer: Self-pay

## 2019-09-07 ENCOUNTER — Encounter (HOSPITAL_BASED_OUTPATIENT_CLINIC_OR_DEPARTMENT_OTHER): Payer: Self-pay | Admitting: Urology

## 2019-09-07 ENCOUNTER — Ambulatory Visit (HOSPITAL_BASED_OUTPATIENT_CLINIC_OR_DEPARTMENT_OTHER)
Admission: RE | Admit: 2019-09-07 | Discharge: 2019-09-07 | Disposition: A | Discharge status: Home / Self Care | Payer: 59 | Attending: Urology | Admitting: Urology

## 2019-09-07 ENCOUNTER — Ambulatory Visit (HOSPITAL_BASED_OUTPATIENT_CLINIC_OR_DEPARTMENT_OTHER): Payer: 59 | Admitting: Anesthesiology

## 2019-09-07 DIAGNOSIS — J449 Chronic obstructive pulmonary disease, unspecified: Secondary | ICD-10-CM | POA: Insufficient documentation

## 2019-09-07 DIAGNOSIS — N529 Male erectile dysfunction, unspecified: Secondary | ICD-10-CM | POA: Insufficient documentation

## 2019-09-07 DIAGNOSIS — F17210 Nicotine dependence, cigarettes, uncomplicated: Secondary | ICD-10-CM | POA: Insufficient documentation

## 2019-09-07 DIAGNOSIS — B1920 Unspecified viral hepatitis C without hepatic coma: Secondary | ICD-10-CM | POA: Diagnosis not present

## 2019-09-07 DIAGNOSIS — E119 Type 2 diabetes mellitus without complications: Secondary | ICD-10-CM | POA: Diagnosis not present

## 2019-09-07 DIAGNOSIS — M1990 Unspecified osteoarthritis, unspecified site: Secondary | ICD-10-CM | POA: Insufficient documentation

## 2019-09-07 DIAGNOSIS — F419 Anxiety disorder, unspecified: Secondary | ICD-10-CM | POA: Diagnosis not present

## 2019-09-07 DIAGNOSIS — N35912 Unspecified bulbous urethral stricture, male: Secondary | ICD-10-CM | POA: Diagnosis not present

## 2019-09-07 DIAGNOSIS — E7800 Pure hypercholesterolemia, unspecified: Secondary | ICD-10-CM | POA: Diagnosis not present

## 2019-09-07 DIAGNOSIS — K219 Gastro-esophageal reflux disease without esophagitis: Secondary | ICD-10-CM | POA: Diagnosis not present

## 2019-09-07 DIAGNOSIS — E039 Hypothyroidism, unspecified: Secondary | ICD-10-CM | POA: Insufficient documentation

## 2019-09-07 DIAGNOSIS — G4730 Sleep apnea, unspecified: Secondary | ICD-10-CM | POA: Diagnosis not present

## 2019-09-07 DIAGNOSIS — Z7982 Long term (current) use of aspirin: Secondary | ICD-10-CM | POA: Diagnosis not present

## 2019-09-07 DIAGNOSIS — Z79899 Other long term (current) drug therapy: Secondary | ICD-10-CM | POA: Diagnosis not present

## 2019-09-07 DIAGNOSIS — Z79890 Hormone replacement therapy (postmenopausal): Secondary | ICD-10-CM | POA: Diagnosis not present

## 2019-09-07 DIAGNOSIS — Z7984 Long term (current) use of oral hypoglycemic drugs: Secondary | ICD-10-CM | POA: Diagnosis not present

## 2019-09-07 DIAGNOSIS — I10 Essential (primary) hypertension: Secondary | ICD-10-CM | POA: Diagnosis not present

## 2019-09-07 DIAGNOSIS — N200 Calculus of kidney: Secondary | ICD-10-CM | POA: Diagnosis present

## 2019-09-07 HISTORY — DX: Myoneural disorder, unspecified: G70.9

## 2019-09-07 HISTORY — DX: Hypothyroidism, unspecified: E03.9

## 2019-09-07 HISTORY — DX: Calculus of kidney: N20.0

## 2019-09-07 HISTORY — DX: Dyspnea, unspecified: R06.00

## 2019-09-07 HISTORY — DX: Personal history of urinary calculi: Z87.442

## 2019-09-07 HISTORY — PX: CYSTOSCOPY WITH RETROGRADE PYELOGRAM, URETEROSCOPY AND STENT PLACEMENT: SHX5789

## 2019-09-07 LAB — POCT I-STAT, CHEM 8
BUN: 14 mg/dL (ref 8–23)
Calcium, Ion: 1.33 mmol/L (ref 1.15–1.40)
Chloride: 103 mmol/L (ref 98–111)
Creatinine, Ser: 0.9 mg/dL (ref 0.61–1.24)
Glucose, Bld: 118 mg/dL — ABNORMAL HIGH (ref 70–99)
HCT: 45 % (ref 39.0–52.0)
Hemoglobin: 15.3 g/dL (ref 13.0–17.0)
Potassium: 4.2 mmol/L (ref 3.5–5.1)
Sodium: 141 mmol/L (ref 135–145)
TCO2: 24 mmol/L (ref 22–32)

## 2019-09-07 LAB — GLUCOSE, CAPILLARY
Glucose-Capillary: 128 mg/dL — ABNORMAL HIGH (ref 70–99)
Glucose-Capillary: 128 mg/dL — ABNORMAL HIGH (ref 70–99)

## 2019-09-07 SURGERY — CYSTOURETEROSCOPY, WITH RETROGRADE PYELOGRAM AND STENT INSERTION
Anesthesia: General | Site: Renal | Laterality: Left

## 2019-09-07 MED ORDER — LIDOCAINE 2% (20 MG/ML) 5 ML SYRINGE
INTRAMUSCULAR | Status: AC
  Filled 2019-09-07: qty 5

## 2019-09-07 MED ORDER — FENTANYL CITRATE (PF) 100 MCG/2ML IJ SOLN
INTRAMUSCULAR | Status: AC
  Filled 2019-09-07: qty 2

## 2019-09-07 MED ORDER — IOHEXOL 300 MG/ML  SOLN
INTRAMUSCULAR | Status: DC | PRN
  Administered 2019-09-07: 20 mL

## 2019-09-07 MED ORDER — PROPOFOL 10 MG/ML IV BOLUS
INTRAVENOUS | Status: AC
  Filled 2019-09-07: qty 40

## 2019-09-07 MED ORDER — PHENYLEPHRINE 40 MCG/ML (10ML) SYRINGE FOR IV PUSH (FOR BLOOD PRESSURE SUPPORT)
PREFILLED_SYRINGE | INTRAVENOUS | Status: AC
  Filled 2019-09-07: qty 10

## 2019-09-07 MED ORDER — ALFUZOSIN HCL ER 10 MG PO TB24: 10.0000 mg | ORAL_TABLET | Freq: Every day | ORAL | 0 refills | Status: DC

## 2019-09-07 MED ORDER — DEXAMETHASONE SODIUM PHOSPHATE 10 MG/ML IJ SOLN
INTRAMUSCULAR | Status: AC
  Filled 2019-09-07: qty 1

## 2019-09-07 MED ORDER — ONDANSETRON HCL 4 MG/2ML IJ SOLN
INTRAMUSCULAR | Status: AC
  Filled 2019-09-07: qty 2

## 2019-09-07 MED ORDER — OXYBUTYNIN CHLORIDE 5 MG PO TABS: 5.0000 mg | ORAL_TABLET | Freq: Three times a day (TID) | ORAL | 0 refills | Status: DC | PRN

## 2019-09-07 MED ORDER — ONDANSETRON HCL 4 MG/2ML IJ SOLN: 4.0000 mg | Freq: Once | INTRAMUSCULAR | Status: DC | PRN

## 2019-09-07 MED ORDER — LIDOCAINE 2% (20 MG/ML) 5 ML SYRINGE
INTRAMUSCULAR | Status: DC | PRN
  Administered 2019-09-07: 100 mg via INTRAVENOUS

## 2019-09-07 MED ORDER — MIDAZOLAM HCL 2 MG/2ML IJ SOLN
INTRAMUSCULAR | Status: AC
  Filled 2019-09-07: qty 2

## 2019-09-07 MED ORDER — FENTANYL CITRATE (PF) 100 MCG/2ML IJ SOLN: 25.0000 ug | INTRAMUSCULAR | Status: DC | PRN

## 2019-09-07 MED ORDER — ONDANSETRON HCL 4 MG/2ML IJ SOLN
INTRAMUSCULAR | Status: DC | PRN
  Administered 2019-09-07: 4 mg via INTRAVENOUS

## 2019-09-07 MED ORDER — PROPOFOL 10 MG/ML IV BOLUS
INTRAVENOUS | Status: DC | PRN
  Administered 2019-09-07: 200 mg via INTRAVENOUS

## 2019-09-07 MED ORDER — MEPERIDINE HCL 25 MG/ML IJ SOLN: 6.2500 mg | INTRAMUSCULAR | Status: DC | PRN

## 2019-09-07 MED ORDER — CIPROFLOXACIN IN D5W 400 MG/200ML IV SOLN
INTRAVENOUS | Status: AC
  Filled 2019-09-07: qty 200

## 2019-09-07 MED ORDER — LACTATED RINGERS IV SOLN: INTRAVENOUS | Status: DC

## 2019-09-07 MED ORDER — TRAMADOL HCL 50 MG PO TABS: 50.0000 mg | ORAL_TABLET | Freq: Four times a day (QID) | ORAL | 0 refills | Status: AC | PRN

## 2019-09-07 MED ORDER — CIPROFLOXACIN IN D5W 400 MG/200ML IV SOLN
400.0000 mg | INTRAVENOUS | Status: AC
  Administered 2019-09-07: 400 mg via INTRAVENOUS

## 2019-09-07 MED ORDER — MIDAZOLAM HCL 5 MG/5ML IJ SOLN
INTRAMUSCULAR | Status: DC | PRN
  Administered 2019-09-07: 2 mg via INTRAVENOUS

## 2019-09-07 MED ORDER — FENTANYL CITRATE (PF) 100 MCG/2ML IJ SOLN
INTRAMUSCULAR | Status: DC | PRN
  Administered 2019-09-07 (×6): 25 ug via INTRAVENOUS
  Administered 2019-09-07: 50 ug via INTRAVENOUS

## 2019-09-07 MED ORDER — PHENYLEPHRINE HCL (PRESSORS) 10 MG/ML IV SOLN
INTRAVENOUS | Status: DC | PRN
  Administered 2019-09-07 (×2): 80 ug via INTRAVENOUS

## 2019-09-07 MED ORDER — SODIUM CHLORIDE 0.9 % IR SOLN
Status: DC | PRN
  Administered 2019-09-07: 3000 mL

## 2019-09-07 SURGICAL SUPPLY — 31 items
BAG DRAIN URO-CYSTO SKYTR STRL (DRAIN) ×3 IMPLANT
BAG DRN RND TRDRP ANRFLXCHMBR (UROLOGICAL SUPPLIES) ×2
BAG DRN UROCATH (DRAIN) ×2
BAG URINE DRAIN 2000ML AR STRL (UROLOGICAL SUPPLIES) ×3 IMPLANT
BALLN NEPHROSTOMY (BALLOONS) ×3
BALLOON NEPHROSTOMY (BALLOONS) ×2 IMPLANT
BASKET ZERO TIP NITINOL 2.4FR (BASKET) IMPLANT
BSKT STON RTRVL ZERO TP 2.4FR (BASKET)
CATH FOLEY 2W COUNCIL 5CC 16FR (CATHETERS) ×3 IMPLANT
CATH URET 5FR 28IN OPEN ENDED (CATHETERS) ×3 IMPLANT
CLOTH BEACON ORANGE TIMEOUT ST (SAFETY) ×3 IMPLANT
DRSG TEGADERM 4X4.75 (GAUZE/BANDAGES/DRESSINGS) IMPLANT
EXTRACTOR STONE 1.7FRX115CM (UROLOGICAL SUPPLIES) IMPLANT
FIBER LASER TRAC TIP (UROLOGICAL SUPPLIES) IMPLANT
GLOVE BIO SURGEON STRL SZ 6.5 (GLOVE) ×3 IMPLANT
GLOVE BIOGEL PI IND STRL 6.5 (GLOVE) ×4 IMPLANT
GLOVE BIOGEL PI INDICATOR 6.5 (GLOVE) ×2
GLOVE SURG SS PI 6.5 STRL IVOR (GLOVE) ×3 IMPLANT
GOWN STRL REUS W/TWL LRG LVL3 (GOWN DISPOSABLE) ×6 IMPLANT
GUIDEWIRE STR DUAL SENSOR (WIRE) ×6 IMPLANT
HOLDER FOLEY CATH W/STRAP (MISCELLANEOUS) ×3 IMPLANT
IV NS IRRIG 3000ML ARTHROMATIC (IV SOLUTION) ×3 IMPLANT
KIT BALLN UROMAX 15FX4 (MISCELLANEOUS) ×2 IMPLANT
KIT BALLN UROMAX 26 75X4 (MISCELLANEOUS) ×3
KIT TURNOVER CYSTO (KITS) ×3 IMPLANT
MANIFOLD NEPTUNE II (INSTRUMENTS) ×3 IMPLANT
PACK CYSTO (CUSTOM PROCEDURE TRAY) ×3 IMPLANT
STENT URET 6FRX28 CONTOUR (STENTS) ×3 IMPLANT
SYR 10ML LL (SYRINGE) ×3 IMPLANT
TUBE CONNECTING 12X1/4 (SUCTIONS) ×3 IMPLANT
TUBING UROLOGY SET (TUBING) ×3 IMPLANT

## 2019-09-07 NOTE — Anesthesia Postprocedure Evaluation (Signed)
Anesthesia Post Note  Patient: Gabriel Coleman  Procedure(s) Performed: CYSTOSCOPY WITH RETROGRADE PYELOGRAM, BALLOON DILITATION , AND STENT PLACEMENT (Left Renal)     Patient location during evaluation: PACU Anesthesia Type: General Level of consciousness: sedated and patient cooperative Pain management: pain level controlled Vital Signs Assessment: post-procedure vital signs reviewed and stable Respiratory status: spontaneous breathing Cardiovascular status: stable Anesthetic complications: no   No complications documented.  Last Vitals:  Vitals:   09/07/19 0830 09/07/19 0845  BP: (!) 128/96 (!) 164/105  Pulse: 84 69  Resp: 17 12  Temp: 36.9 C   SpO2: 92% 93%    Last Pain:  Vitals:   09/07/19 0915  TempSrc:   PainSc: 0-No pain                 Nolon Nations

## 2019-09-07 NOTE — Op Note (Signed)
Preoperative diagnosis:  1. Left renal pelvis calculus  Postoperative diagnosis:  1. Left renal pelvis calculus 2. Bulbar urethral stricture  Procedure:  1. Cystoscopy 2. left ureteral stent placement 3. left retrograde pyelography with interpretation  4. Urethral stricture balloon dilation  Surgeon: Jacalyn Lefevre, MD  Anesthesia: General  Complications: None  Intraoperative findings:  1.  left retrograde pyelography demonstrated a filling defect within the left ureter consistent with the patient's known calculus without other abnormalities. 2.  Bulbar urethral stricture smaller than 8 [place] successfully balloon dilated to 24 [place] 3.  6 x 28 left ureteral stent 4.  16 French council tip Foley  EBL: Minimal  Specimens: None  Indication: Gabriel Coleman is a 64 y.o. patient with 7 mm left renal pelvis calculus associated with left flank pain. After reviewing the management options for treatment, he elected to proceed with the above surgical procedure(s). We have discussed the potential benefits and risks of the procedure, side effects of the proposed treatment, the likelihood of the patient achieving the goals of the procedure, and any potential problems that might occur during the procedure or recuperation. Informed consent has been obtained.  Description of procedure:  The patient was taken to the operating room and general anesthesia was induced.  The patient was placed in the dorsal lithotomy position, prepped and draped in the usual sterile fashion, and preoperative antibiotics were administered. A preoperative time-out was performed.   Cystourethroscopy was performed.  The patient's urethra was examined and was noted to have a significant bulbar urethral stricture.  Gentle pressure with the scope did not pass through the stricture.  The distal part of the stricture appeared thin however more proximal was thicker and more occluded.  A wire was then placed through the  cystoscope and advanced into the bladder.  Attempts at advancing the scope over the wire were unsuccessful.  An Ultrex nephrostomy balloon dilator was then used to dilate the strictured area to 69 [place].  The cystoscope was unable to traverse into the bladder.  The bladder was then systematically examined in its entirety. There was no evidence for any bladder tumors, stones, or other mucosal pathology.    Attention then turned to the leftureteral orifice and a ureteral catheter was used to intubate the ureteral orifice.  Omnipaque contrast was injected through the ureteral catheter and a retrograde pyelogram was performed with findings as dictated above.  A 0.38 sensor guidewire was then advanced up the left ureter into the renal pelvis under fluoroscopic guidance.  The wire was then backloaded through the cystoscope and a ureteral stent was advance over the wire using Seldinger technique.  The stent was positioned appropriately under fluoroscopic and cystoscopic guidance.  The wire was then removed with an adequate stent curl noted in the renal pelvis as well as in the bladder.  The bladder was then emptied and the procedure ended.  The patient appeared to tolerate the procedure well and without complications.  The patient was able to be awakened and transferred to the recovery unit in satisfactory condition.    Follow-up: The patient will be discharged home with a Foley catheter in place for 3 days.  He will be scheduled for a staged procedure to remove the stone in 2 to 3 weeks.  Jacalyn Lefevre, M.D.

## 2019-09-07 NOTE — Anesthesia Procedure Notes (Signed)
Procedure Name: LMA Insertion Date/Time: 09/07/2019 7:38 AM Performed by: Justice Rocher, CRNA Pre-anesthesia Checklist: Patient identified, Emergency Drugs available, Suction available, Patient being monitored and Timeout performed Patient Re-evaluated:Patient Re-evaluated prior to induction Oxygen Delivery Method: Circle system utilized Preoxygenation: Pre-oxygenation with 100% oxygen Induction Type: IV induction Ventilation: Mask ventilation without difficulty LMA: LMA inserted LMA Size: 5.0 Number of attempts: 1 Airway Equipment and Method: Bite block Placement Confirmation: positive ETCO2,  breath sounds checked- equal and bilateral and CO2 detector Tube secured with: Tape Dental Injury: Teeth and Oropharynx as per pre-operative assessment

## 2019-09-07 NOTE — Interval H&P Note (Signed)
History and Physical Interval Note: Patient is still experiencing intermittent left flank and testicular discomfort.  His insurance would not approve ESWL.  We have discussed ureteroscopy, laser lithotripsy, and stent placement as an alternative.  The risks and benefits of this procedure were discussed with the patient in detail.  He is agreeable.  He also understands this may be a staged procedure.    09/07/2019 7:13 AM  Gabriel Coleman  has presented today for surgery, with the diagnosis of LEFT RENAL CALCULUS.  The various methods of treatment have been discussed with the patient and family. After consideration of risks, benefits and other options for treatment, the patient has consented to  Procedure(s) with comments: CYSTOSCOPY WITH RETROGRADE PYELOGRAM, URETEROSCOPY AND STENT PLACEMENT (Left) - 36 MINS HOLMIUM LASER APPLICATION (Left) as a surgical intervention.  The patient's history has been reviewed, patient examined, no change in status, stable for surgery.  I have reviewed the patient's chart and labs.  Questions were answered to the patient's satisfaction.     Maebelle Sulton D Brooks Stotz

## 2019-09-07 NOTE — Discharge Instructions (Signed)
DISCHARGE INSTRUCTIONS FOR KIDNEY STONE/URETERAL STENT   MEDICATIONS:  1. Resume all your other meds from home - except do not take any extra narcotic pain meds that you may have at home.  2. Tramadol is for moderate/severe pain, otherwise taking upto 1000 mg every 6 hours of plainTylenol will help treat your pain.   3. Oxybutynin is to help with bladder cramping/pressure 4. Flomax can help with stent discomfort.  ACTIVITY:  1. No strenuous activity x 1week  2. No driving while on narcotic pain medications  3. Drink plenty of water  4. Continue to walk at home - you can still get blood clots when you are at home, so keep active, but don't over do it.  5. May return to work/school tomorrow or when you feel ready   BATHING:  1. You can shower and we recommend daily showers  2. You have a string coming from your urethra: The stent string is attached to your ureteral stent. Do not pull on this.   SIGNS/SYMPTOMS TO CALL:  Please call us if you have a fever greater than 101.5, uncontrolled nausea/vomiting, uncontrolled pain, dizziness, unable to urinate, bloody urine, chest pain, shortness of breath, leg swelling, leg pain, redness around wound, drainage from wound, or any other concerns or questions.   You can reach [name] at [phone].   FOLLOW-UP:  1. You have a catheter that will drain your bladder.  This will stay in place till Friday, August 6. 2.  You also have a stent on the left side going from the kidney down to the bladder.  This will stay in place until your second procedure.   Post Anesthesia Home Care Instructions  Activity: Get plenty of rest for the remainder of the day. A responsible adult should stay with you for 24 hours following the procedure.  For the next 24 hours, DO NOT: -Drive a car -Paediatric nurse -Drink alcoholic beverages -Take any medication unless instructed by your physician -Make any legal decisions or sign important papers.  Meals: Start with  liquid foods such as gelatin or soup. Progress to regular foods as tolerated. Avoid greasy, spicy, heavy foods. If nausea and/or vomiting occur, drink only clear liquids until the nausea and/or vomiting subsides. Call your physician if vomiting continues.  Special Instructions/Symptoms: Your throat may feel dry or sore from the anesthesia or the breathing tube placed in your throat during surgery. If this causes discomfort, gargle with warm salt water. The discomfort should disappear within 24 hours.  If you had a scopolamine patch placed behind your ear for the management of post- operative nausea and/or vomiting:  1. The medication in the patch is effective for 72 hours, after which it should be removed.  Wrap patch in a tissue and discard in the trash. Wash hands thoroughly with soap and water. 2. You may remove the patch earlier than 72 hours if you experience unpleasant side effects which may include dry mouth, dizziness or visual disturbances. 3. Avoid touching the patch. Wash your hands with soap and water after contact with the patch.

## 2019-09-07 NOTE — Transfer of Care (Signed)
Immediate Anesthesia Transfer of Care Note  Patient: Gabriel Coleman  Procedure(s) Performed: Procedure(s) (LRB): CYSTOSCOPY WITH RETROGRADE PYELOGRAM, BALLOON DILITATION , AND STENT PLACEMENT (Left)  Patient Location: PACU  Anesthesia Type: General  Level of Consciousness: awake, sedated, patient cooperative and responds to stimulation  Airway & Oxygen Therapy: Patient Spontanous Breathing and Patient connected to [place] 02 and soft FM   Post-op Assessment: Report given to PACU RN, Post -op Vital signs reviewed and stable and Patient moving all extremities  Post vital signs: Reviewed and stable  Complications: No apparent anesthesia complications

## 2019-09-08 ENCOUNTER — Other Ambulatory Visit: Payer: Self-pay | Admitting: Urology

## 2019-09-08 ENCOUNTER — Encounter (HOSPITAL_BASED_OUTPATIENT_CLINIC_OR_DEPARTMENT_OTHER): Payer: Self-pay | Admitting: Urology

## 2019-09-14 NOTE — Progress Notes (Signed)
Pt calling back after 09-26-2019 colonscopy at [facility], pt states having dr brown urine x 1 day, pt instructed to call dr pace if urine still brown tomorrow and make dr pace aware, pt vocalized understanding

## 2019-09-15 ENCOUNTER — Ambulatory Visit (AMBULATORY_SURGERY_CENTER): Payer: 59 | Admitting: Gastroenterology

## 2019-09-15 ENCOUNTER — Encounter: Payer: Self-pay | Admitting: Gastroenterology

## 2019-09-15 ENCOUNTER — Other Ambulatory Visit: Payer: Self-pay

## 2019-09-15 VITALS — BP 130/56 | HR 63 | Temp 97.1°F | Resp 12 | Ht 72.0 in | Wt 232.0 lb

## 2019-09-15 DIAGNOSIS — D128 Benign neoplasm of rectum: Secondary | ICD-10-CM

## 2019-09-15 DIAGNOSIS — D123 Benign neoplasm of transverse colon: Secondary | ICD-10-CM

## 2019-09-15 DIAGNOSIS — K635 Polyp of colon: Secondary | ICD-10-CM

## 2019-09-15 DIAGNOSIS — D122 Benign neoplasm of ascending colon: Secondary | ICD-10-CM | POA: Diagnosis not present

## 2019-09-15 DIAGNOSIS — D127 Benign neoplasm of rectosigmoid junction: Secondary | ICD-10-CM

## 2019-09-15 DIAGNOSIS — D124 Benign neoplasm of descending colon: Secondary | ICD-10-CM

## 2019-09-15 DIAGNOSIS — Z1211 Encounter for screening for malignant neoplasm of colon: Secondary | ICD-10-CM

## 2019-09-15 MED ORDER — SODIUM CHLORIDE 0.9 % IV SOLN
500.0000 mL | INTRAVENOUS | Status: DC
Start: 2019-09-15 — End: 2019-09-15

## 2019-09-15 NOTE — Progress Notes (Signed)
Vs CW I have reviewed the patient's medical history in detail and updated the computerized patient record.   

## 2019-09-15 NOTE — Progress Notes (Signed)
Report given to PACU, vss 

## 2019-09-15 NOTE — Op Note (Signed)
[place] Patient Name: Gabriel Coleman Procedure Date: 09/15/2019 8:15 AM MRN: [id number] Endoscopist: Justice Britain , MD Age: 64 Referring MD:  Date of Birth: 1955/04/30 Gender: Male Account #: [id number] Procedure:                Colonoscopy Indications:              Screening for colorectal malignant neoplasm, This                            is the patient's first colonoscopy Medicines:                Monitored Anesthesia Care Procedure:                Pre-Anesthesia Assessment:                           - Prior to the procedure, a History and Physical                            was performed, and patient medications and                            allergies were reviewed. The patient's tolerance of                            previous anesthesia was also reviewed. The risks                            and benefits of the procedure and the sedation                            options and risks were discussed with the patient.                            All questions were answered, and informed consent                            was obtained. Prior Anticoagulants: The patient has                            taken no previous anticoagulant or antiplatelet                            agents. ASA Grade Assessment: III - A patient with                            severe systemic disease. After reviewing the risks                            and benefits, the patient was deemed in                            satisfactory condition to undergo the procedure.  After obtaining informed consent, the colonoscope                            was passed under direct vision. Throughout the                            procedure, the patient's blood pressure, pulse, and                            oxygen saturations were monitored continuously. The                            Colonoscope was introduced through the anus and                            advanced to the the  cecum, identified by the                            appendiceal orifice. The colonoscopy was                            technically difficult and complex due to                            significant looping. Successful completion of the                            procedure was aided by changing the patient's                            position, using manual pressure, straightening and                            shortening the scope to obtain bowel loop reduction                            and using scope torsion. The patient tolerated the                            procedure. The quality of the bowel preparation was                            adequate. The ileocecal valve, appendiceal orifice,                            and rectum were photographed. Scope In: 8:22:28 AM Scope Out: 8:59:40 AM Scope Withdrawal Time: 0 hours 31 minutes 1 second  Total Procedure Duration: 0 hours 37 minutes 12 seconds  Findings:                 The digital rectal exam findings include                            hemorrhoids. Pertinent negatives include no  palpable rectal lesions.                           The colon (entire examined portion) revealed                            significantly excessive looping.                           Six sessile polyps were found in the rectum (1),                            transverse colon (4) and ascending colon (1). The                            polyps were 2 to 6 mm in size. These polyps were                            removed with a cold snare. Resection and retrieval                            were complete.                           A 20 mm polyp was found in the descending colon.                            The polyp was pedunculated. Preparations were made                            for mucosal resection. NBI and White-light was done                            to demarcate the borders of the lesion. Saline was                             injected to raise the lesion. Snare mucosal                            resection was performed. Resection and retrieval                            were complete. To prevent bleeding after mucosal                            resection, two hemostatic clips were successfully                            placed (MR conditional). There was no bleeding                            during, or at the end, of the procedure.  A 18 mm polyp was found in the recto-sigmoid colon.                            The polyp was pedunculated. Preparations were made                            for mucosal resection. NBI and White light was done                            to demarcate the borders of the lesion. Saline was                            injected to raise the lesion. Snare mucosal                            resection was performed. Resection and retrieval                            were complete. To prevent bleeding after mucosal                            resection, two hemostatic clips were successfully                            placed (MR conditional). There was no bleeding                            during, or at the end, of the procedure.                           Non-bleeding non-thrombosed internal hemorrhoids                            were found during retroflexion, during perianal                            exam and during digital exam. The hemorrhoids were                            Grade II (internal hemorrhoids that prolapse but                            reduce spontaneously). Complications:            No immediate complications. Estimated Blood Loss:     Estimated blood loss was minimal. Impression:               - Hemorrhoids found on digital rectal exam.                           - There was significant looping of the colon.                           - Six 2 to 6 mm polyps in the rectum, in  the                            transverse colon and in the ascending colon,                             removed with a cold snare. Resected and retrieved.                           - One 20 mm polyp in the descending colon, removed                            with mucosal resection. Resected and retrieved.                            Clips (MR conditional) were placed.                           - One 18 mm polyp at the recto-sigmoid colon,                            removed with mucosal resection. Resected and                            retrieved. Clips (MR conditional) were placed.                           - Non-bleeding non-thrombosed internal hemorrhoids. Recommendation:           - The patient will be observed post-procedure,                            until all discharge criteria are met.                           - Discharge patient to home.                           - Patient has a contact number available for                            emergencies. The signs and symptoms of potential                            delayed complications were discussed with the                            patient. Return to normal activities tomorrow.                            Written discharge instructions were provided to the                            patient.                           -  High fiber diet.                           - Monitor for signs/symptoms of bleeding,                            perforation, and infection. If issues please call                            our number to get further assistance as needed.                           - No aspirin, ibuprofen, naproxen, or other                            non-steroidal anti-inflammatory drugs for 2 weeks                            after polyp removal.                           - Continue present medications.                           - Await pathology results.                           - Repeat colonoscopy in likely 3 years for                            surveillance based on pathology results.                            - The findings and recommendations were discussed                            with the patient.                           - The findings and recommendations were discussed                            with the patient's family. Justice Britain, MD 09/15/2019 9:07:33 AM

## 2019-09-15 NOTE — Patient Instructions (Signed)
Thank you for allowing [name] to care for you today!  Await pathology results approximately 2 weeks by mail.  Dr Rush Landmark will make his recommendation for next colonoscopy at that time.  No Aspirin, Ibuprofen, Naproxen or other NSAIDS for 2 weeks.  May use Tylenol for pain or fever.  Resume previous diet and medications today.  Recommend eating more high fiber foods daily, handout given.  Return to your normal activities tomorrow.    YOU HAD AN ENDOSCOPIC PROCEDURE TODAY AT [facility]:   Refer to the procedure report that was given to you for any specific questions about what was found during the examination.  If the procedure report does not answer your questions, please call your gastroenterologist to clarify.  If you requested that your care partner not be given the details of your procedure findings, then the procedure report has been included in a sealed envelope for you to review at your convenience later.  YOU SHOULD EXPECT: Some feelings of bloating in the abdomen. Passage of more gas than usual.  Walking can help get rid of the air that was put into your GI tract during the procedure and reduce the bloating. If you had a lower endoscopy (such as a colonoscopy or flexible sigmoidoscopy) you may notice spotting of blood in your stool or on the toilet paper. If you underwent a bowel prep for your procedure, you may not have a normal bowel movement for a few days.  Please Note:  You might notice some irritation and congestion in your nose or some drainage.  This is from the oxygen used during your procedure.  There is no need for concern and it should clear up in a day or so.  SYMPTOMS TO REPORT IMMEDIATELY:   Following lower endoscopy (colonoscopy or flexible sigmoidoscopy):  Excessive amounts of blood in the stool  Significant tenderness or worsening of abdominal pains  Swelling of the abdomen that is new, acute  Fever of 100F or higher   For urgent or emergent  issues, a gastroenterologist can be reached at any hour by calling [phone]. Do not use MyChart messaging for urgent concerns.    DIET:  We do recommend a small meal at first, but then you may proceed to your regular diet.  Drink plenty of fluids but you should avoid alcoholic beverages for 24 hours.  ACTIVITY:  You should plan to take it easy for the rest of today and you should NOT DRIVE or use heavy machinery until tomorrow (because of the sedation medicines used during the test).    FOLLOW UP: Our staff will call the number listed on your records 48-72 hours following your procedure to check on you and address any questions or concerns that you may have regarding the information given to you following your procedure. If we do not reach you, we will leave a message.  We will attempt to reach you two times.  During this call, we will ask if you have developed any symptoms of COVID 19. If you develop any symptoms (ie: fever, flu-like symptoms, shortness of breath, cough etc.) before then, please call [phone].  If you test positive for Covid 19 in the 2 weeks post procedure, please call and report this information to [name].    If any biopsies were taken you will be contacted by phone or by letter within the next 1-3 weeks.  Please call us at [phone] if you have not heard about the biopsies in 3 weeks.  SIGNATURES/CONFIDENTIALITY: You and/or your care partner have signed paperwork which will be entered into your electronic medical record.  These signatures attest to the fact that that the information above on your After Visit Summary has been reviewed and is understood.  Full responsibility of the confidentiality of this discharge information lies with you and/or your care-partner.

## 2019-09-15 NOTE — Progress Notes (Signed)
Called to room to assist during endoscopic procedure.  Patient ID and intended procedure confirmed with present staff. Received instructions for my participation in the procedure from the performing physician.  

## 2019-09-16 LAB — HM DIABETES EYE EXAM

## 2019-09-17 ENCOUNTER — Telehealth: Payer: Self-pay | Admitting: *Deleted

## 2019-09-17 ENCOUNTER — Other Ambulatory Visit (HOSPITAL_COMMUNITY)
Admission: RE | Admit: 2019-09-17 | Discharge: 2019-09-17 | Disposition: A | Discharge status: Home / Self Care | Payer: 59 | Source: Ambulatory Visit | Attending: Urology | Admitting: Urology

## 2019-09-17 DIAGNOSIS — Z01812 Encounter for preprocedural laboratory examination: Secondary | ICD-10-CM | POA: Insufficient documentation

## 2019-09-17 DIAGNOSIS — Z20822 Contact with and (suspected) exposure to covid-19: Secondary | ICD-10-CM | POA: Insufficient documentation

## 2019-09-17 LAB — SARS CORONAVIRUS 2 (TAT 6-24 HRS): SARS Coronavirus 2: NEGATIVE

## 2019-09-17 NOTE — Telephone Encounter (Signed)
Follow up call made, mailbox was full.

## 2019-09-20 ENCOUNTER — Encounter (HOSPITAL_BASED_OUTPATIENT_CLINIC_OR_DEPARTMENT_OTHER): Payer: Self-pay | Admitting: Urology

## 2019-09-20 ENCOUNTER — Other Ambulatory Visit: Payer: Self-pay

## 2019-09-20 ENCOUNTER — Encounter: Payer: Self-pay | Admitting: Internal Medicine

## 2019-09-20 NOTE — Progress Notes (Addendum)
Spoke w/ via phone for pre-op interview---PT Lab needs dos----   I stat 8            Lab results-----ekg 09-07-2019 epic/chart COVID test ------09-17-2019 at 1400 pm Arrive at -------545 am 09-21-2019 NPO after MN NO Solid Food.  Clear liquids from MN until---445 am then npo Medications to take morning of surgery -----pt wishes to take no medications day of surgery, bringing albuterol inhaler Diabetic medication -----none day of surgery Patient Special Instructions -----none Pre-Op special Istructions -----none Patient verbalized understanding of instructions that were given at this phone interview. Patient denies shortness of breath, chest pain, fever, cough at this phone interview.  Last dose 81 mg aspirin 09-11-2019 per patient

## 2019-09-20 NOTE — Anesthesia Preprocedure Evaluation (Addendum)
Anesthesia Evaluation  Patient identified by MRN, date of birth, ID band Patient awake    Reviewed: Allergy & Precautions, NPO status , Patient's Chart, lab work & pertinent test results, reviewed documented beta blocker date and time   Airway Mallampati: III  TM Distance: >3 FB Neck ROM: Full    Dental  (+) Caps, Missing, Poor Dentition,    Pulmonary shortness of breath and with exertion, sleep apnea , COPD,  COPD inhaler, former smoker,  Undiagnosed OSA- wife reports snoring and apnea episodes   Pulmonary exam normal breath sounds clear to auscultation       Cardiovascular hypertension, Pt. on medications Normal cardiovascular exam Rhythm:Regular Rate:Normal  Stress Test 07/22/17  The left ventricular ejection fraction is normal (55-65%).  Nuclear stress EF: 57%.  There was no ST segment deviation noted during stress.  This is a low risk study.   1. EF 57%, normal wall motion.  2. Fixed small, mild basal inferior perfusion defect.  Given normal wall motion, suspect diaphragmatic attenuation.  No ischemia.   Low risk study.    EKG 09/07/19 NSR, non specific ST- Twave changes not present on previous EKG   Neuro/Psych Anxiety  Neuromuscular disease    GI/Hepatic GERD  Medicated and Controlled,(+) Hepatitis -, [facility]ep C treated years ago   Endo/Other  diabetes, Poorly Controlled, Type 2, Oral Hypoglycemic AgentsHypothyroidism Hyperthyroidism Obesity  Hyperlipidemia  Renal/GU Renal diseaseLeft nephrolithiasis  negative genitourinary   Musculoskeletal  (+) Arthritis , Osteoarthritis,    Abdominal (+) + obese,   Peds  Hematology negative hematology ROS (+)   Anesthesia Other Findings   Reproductive/Obstetrics                           Anesthesia Physical Anesthesia Plan  ASA: III  Anesthesia Plan: General   Post-op Pain Management:    Induction: Intravenous  PONV Risk Score and  Plan: 4 or greater and Midazolam, Ondansetron and Treatment may vary due to age or medical condition  Airway Management Planned: LMA  Additional Equipment:   Intra-op Plan:   Post-operative Plan: Extubation in OR  Informed Consent: I have reviewed the patients History and Physical, chart, labs and discussed the procedure including the risks, benefits and alternatives for the proposed anesthesia with the patient or authorized representative who has indicated his/her understanding and acceptance.     Dental advisory given  Plan Discussed with: CRNA and Anesthesiologist  Anesthesia Plan Comments:        Anesthesia Quick Evaluation

## 2019-09-21 ENCOUNTER — Encounter (HOSPITAL_BASED_OUTPATIENT_CLINIC_OR_DEPARTMENT_OTHER)
Admission: RE | Disposition: A | Discharge status: Home / Self Care | Payer: Self-pay | Source: Home / Self Care | Attending: Urology

## 2019-09-21 ENCOUNTER — Ambulatory Visit (HOSPITAL_BASED_OUTPATIENT_CLINIC_OR_DEPARTMENT_OTHER)
Admission: RE | Admit: 2019-09-21 | Discharge: 2019-09-21 | Disposition: A | Discharge status: Home / Self Care | Payer: 59 | Attending: Urology | Admitting: Urology

## 2019-09-21 ENCOUNTER — Ambulatory Visit (HOSPITAL_BASED_OUTPATIENT_CLINIC_OR_DEPARTMENT_OTHER): Payer: 59 | Admitting: Anesthesiology

## 2019-09-21 ENCOUNTER — Encounter (HOSPITAL_BASED_OUTPATIENT_CLINIC_OR_DEPARTMENT_OTHER): Payer: Self-pay | Admitting: Urology

## 2019-09-21 DIAGNOSIS — K219 Gastro-esophageal reflux disease without esophagitis: Secondary | ICD-10-CM | POA: Diagnosis not present

## 2019-09-21 DIAGNOSIS — Z7982 Long term (current) use of aspirin: Secondary | ICD-10-CM | POA: Diagnosis not present

## 2019-09-21 DIAGNOSIS — E119 Type 2 diabetes mellitus without complications: Secondary | ICD-10-CM | POA: Insufficient documentation

## 2019-09-21 DIAGNOSIS — N5201 Erectile dysfunction due to arterial insufficiency: Secondary | ICD-10-CM | POA: Insufficient documentation

## 2019-09-21 DIAGNOSIS — I861 Scrotal varices: Secondary | ICD-10-CM | POA: Diagnosis not present

## 2019-09-21 DIAGNOSIS — E7800 Pure hypercholesterolemia, unspecified: Secondary | ICD-10-CM | POA: Diagnosis not present

## 2019-09-21 DIAGNOSIS — J449 Chronic obstructive pulmonary disease, unspecified: Secondary | ICD-10-CM | POA: Insufficient documentation

## 2019-09-21 DIAGNOSIS — E785 Hyperlipidemia, unspecified: Secondary | ICD-10-CM | POA: Diagnosis not present

## 2019-09-21 DIAGNOSIS — G709 Myoneural disorder, unspecified: Secondary | ICD-10-CM | POA: Diagnosis not present

## 2019-09-21 DIAGNOSIS — N200 Calculus of kidney: Secondary | ICD-10-CM | POA: Diagnosis present

## 2019-09-21 DIAGNOSIS — Z79899 Other long term (current) drug therapy: Secondary | ICD-10-CM | POA: Diagnosis not present

## 2019-09-21 DIAGNOSIS — G4733 Obstructive sleep apnea (adult) (pediatric): Secondary | ICD-10-CM | POA: Insufficient documentation

## 2019-09-21 DIAGNOSIS — E669 Obesity, unspecified: Secondary | ICD-10-CM | POA: Insufficient documentation

## 2019-09-21 DIAGNOSIS — Z6831 Body mass index (BMI) 31.0-31.9, adult: Secondary | ICD-10-CM | POA: Insufficient documentation

## 2019-09-21 DIAGNOSIS — I10 Essential (primary) hypertension: Secondary | ICD-10-CM | POA: Diagnosis not present

## 2019-09-21 DIAGNOSIS — Z87891 Personal history of nicotine dependence: Secondary | ICD-10-CM | POA: Diagnosis not present

## 2019-09-21 DIAGNOSIS — M1990 Unspecified osteoarthritis, unspecified site: Secondary | ICD-10-CM | POA: Insufficient documentation

## 2019-09-21 DIAGNOSIS — Z7984 Long term (current) use of oral hypoglycemic drugs: Secondary | ICD-10-CM | POA: Insufficient documentation

## 2019-09-21 HISTORY — PX: CYSTOSCOPY WITH RETROGRADE PYELOGRAM, URETEROSCOPY AND STENT PLACEMENT: SHX5789

## 2019-09-21 HISTORY — PX: HOLMIUM LASER APPLICATION: SHX5852

## 2019-09-21 LAB — GLUCOSE, CAPILLARY: Glucose-Capillary: 121 mg/dL — ABNORMAL HIGH (ref 70–99)

## 2019-09-21 LAB — POCT I-STAT, CHEM 8
BUN: 14 mg/dL (ref 8–23)
Calcium, Ion: 1.31 mmol/L (ref 1.15–1.40)
Chloride: 103 mmol/L (ref 98–111)
Creatinine, Ser: 0.9 mg/dL (ref 0.61–1.24)
Glucose, Bld: 120 mg/dL — ABNORMAL HIGH (ref 70–99)
HCT: 44 % (ref 39.0–52.0)
Hemoglobin: 15 g/dL (ref 13.0–17.0)
Potassium: 4.3 mmol/L (ref 3.5–5.1)
Sodium: 141 mmol/L (ref 135–145)
TCO2: 25 mmol/L (ref 22–32)

## 2019-09-21 SURGERY — CYSTOURETEROSCOPY, WITH RETROGRADE PYELOGRAM AND STENT INSERTION
Anesthesia: General | Site: Urethra | Laterality: Left

## 2019-09-21 MED ORDER — CIPROFLOXACIN HCL 500 MG PO TABS
500.0000 mg | ORAL_TABLET | Freq: Once | ORAL | 0 refills | Status: AC
Start: 2019-09-21 — End: 2019-09-21

## 2019-09-21 MED ORDER — CIPROFLOXACIN IN D5W 400 MG/200ML IV SOLN
400.0000 mg | INTRAVENOUS | Status: AC
  Administered 2019-09-21: 400 mg via INTRAVENOUS

## 2019-09-21 MED ORDER — ONDANSETRON HCL 4 MG/2ML IJ SOLN: 4.0000 mg | Freq: Once | INTRAMUSCULAR | Status: DC | PRN

## 2019-09-21 MED ORDER — OXYCODONE HCL 5 MG PO TABS: 5.0000 mg | ORAL_TABLET | Freq: Once | ORAL | Status: DC | PRN

## 2019-09-21 MED ORDER — OXYCODONE HCL 5 MG/5ML PO SOLN: 5.0000 mg | Freq: Once | ORAL | Status: DC | PRN

## 2019-09-21 MED ORDER — IOHEXOL 300 MG/ML  SOLN
INTRAMUSCULAR | Status: DC | PRN
  Administered 2019-09-21: 1 mL

## 2019-09-21 MED ORDER — LIDOCAINE 2% (20 MG/ML) 5 ML SYRINGE
INTRAMUSCULAR | Status: AC
  Filled 2019-09-21: qty 5

## 2019-09-21 MED ORDER — SODIUM CHLORIDE 0.9 % IR SOLN
Status: DC | PRN
  Administered 2019-09-21: 3000 mL

## 2019-09-21 MED ORDER — HYDROMORPHONE HCL 1 MG/ML IJ SOLN
INTRAMUSCULAR | Status: AC
  Filled 2019-09-21: qty 1

## 2019-09-21 MED ORDER — PROPOFOL 10 MG/ML IV BOLUS
INTRAVENOUS | Status: AC
  Filled 2019-09-21: qty 20

## 2019-09-21 MED ORDER — LIDOCAINE 2% (20 MG/ML) 5 ML SYRINGE
INTRAMUSCULAR | Status: DC | PRN
  Administered 2019-09-21: 80 mg via INTRAVENOUS

## 2019-09-21 MED ORDER — PROPOFOL 10 MG/ML IV BOLUS
INTRAVENOUS | Status: DC | PRN
  Administered 2019-09-21: 150 mg via INTRAVENOUS

## 2019-09-21 MED ORDER — FENTANYL CITRATE (PF) 100 MCG/2ML IJ SOLN
INTRAMUSCULAR | Status: AC
  Filled 2019-09-21: qty 2

## 2019-09-21 MED ORDER — ONDANSETRON HCL 4 MG/2ML IJ SOLN
INTRAMUSCULAR | Status: DC | PRN
  Administered 2019-09-21: 4 mg via INTRAVENOUS

## 2019-09-21 MED ORDER — FENTANYL CITRATE (PF) 100 MCG/2ML IJ SOLN
INTRAMUSCULAR | Status: DC | PRN
  Administered 2019-09-21 (×4): 25 ug via INTRAVENOUS

## 2019-09-21 MED ORDER — HYDROMORPHONE HCL 1 MG/ML IJ SOLN
0.2500 mg | INTRAMUSCULAR | Status: DC | PRN
  Administered 2019-09-21 (×2): 0.5 mg via INTRAVENOUS

## 2019-09-21 MED ORDER — DEXAMETHASONE SODIUM PHOSPHATE 10 MG/ML IJ SOLN
INTRAMUSCULAR | Status: DC | PRN
  Administered 2019-09-21: 4 mg via INTRAVENOUS

## 2019-09-21 MED ORDER — CIPROFLOXACIN IN D5W 400 MG/200ML IV SOLN
INTRAVENOUS | Status: AC
  Filled 2019-09-21: qty 200

## 2019-09-21 MED ORDER — LACTATED RINGERS IV SOLN: INTRAVENOUS | Status: DC

## 2019-09-21 SURGICAL SUPPLY — 22 items
BAG DRAIN URO-CYSTO SKYTR STRL (DRAIN) ×2 IMPLANT
BAG DRN UROCATH (DRAIN) ×1
BASKET ZERO TIP NITINOL 2.4FR (BASKET) ×2 IMPLANT
BSKT STON RTRVL ZERO TP 2.4FR (BASKET) ×1
CATH URET 5FR 28IN OPEN ENDED (CATHETERS) ×2 IMPLANT
CLOTH BEACON ORANGE TIMEOUT ST (SAFETY) ×2 IMPLANT
DRSG TEGADERM 2-3/8X2-3/4 SM (GAUZE/BANDAGES/DRESSINGS) ×2 IMPLANT
FIBER LASER TRAC TIP (UROLOGICAL SUPPLIES) ×2 IMPLANT
GLOVE BIO SURGEON STRL SZ 6.5 (GLOVE) ×2 IMPLANT
GLOVE BIOGEL PI IND STRL 6.5 (GLOVE) ×1 IMPLANT
GLOVE BIOGEL PI INDICATOR 6.5 (GLOVE) ×1
GLOVE ECLIPSE 6.5 STRL STRAW (GLOVE) ×2 IMPLANT
GOWN STRL REUS W/TWL LRG LVL3 (GOWN DISPOSABLE) ×4 IMPLANT
GUIDEWIRE STR DUAL SENSOR (WIRE) ×4 IMPLANT
IV NS IRRIG 3000ML ARTHROMATIC (IV SOLUTION) ×2 IMPLANT
KIT TURNOVER CYSTO (KITS) ×2 IMPLANT
MANIFOLD NEPTUNE II (INSTRUMENTS) ×2 IMPLANT
PACK CYSTO (CUSTOM PROCEDURE TRAY) ×2 IMPLANT
SHEATH URETERAL 12FRX35CM (MISCELLANEOUS) ×2 IMPLANT
STENT URET 6FRX28 CONTOUR (STENTS) ×2 IMPLANT
TUBE CONNECTING 12X1/4 (SUCTIONS) ×2 IMPLANT
TUBING UROLOGY SET (TUBING) ×2 IMPLANT

## 2019-09-21 NOTE — Interval H&P Note (Signed)
History and Physical Interval Note:  09/21/2019 7:05 AM  Gabriel Coleman  has presented today for surgery, with the diagnosis of LEFT RENAL CALCULUS.  The various methods of treatment have been discussed with the patient and family. After consideration of risks, benefits and other options for treatment, the patient has consented to  Procedure(s) with comments: CYSTOSCOPY WITH RETROGRADE PYELOGRAM, URETEROSCOPY AND STENT EXCHANGE (Left) - 1 HR HOLMIUM LASER APPLICATION (Left) as a surgical intervention.  The patient's history has been reviewed, patient examined, no change in status, stable for surgery.  I have reviewed the patient's chart and labs.  Questions were answered to the patient's satisfaction.     Lithzy Bernard D Mafalda Mcginniss

## 2019-09-21 NOTE — Op Note (Signed)
Preoperative diagnosis: left renal pelvis calculus  Postoperative diagnosis: same  Procedure:  1. Cystoscopy 2. left ureteroscopy, laser lithotripsy and stone removal 3. left 49F x 28 ureteral stent exchange - with string  Surgeon: Jacalyn Lefevre, MD  Anesthesia: General  Complications: None  Intraoperative findings: 1.  Anterior urethra with evidence of prior stricture site open 2.  Lateral lobe prostate enlargement 3.  Bladder mucosa normal without masses, stones, abnormality 4.  Existing 6 x 28 stent seen emanating from left ureteral orifice 5.  Bilateral orthotopic ureteral orifices  EBL: Minimal  Specimens: 1. left renal calculus  Disposition of specimens: [facility] for stone analysis  Indication: Gabriel Coleman is a 64 y.o.   patient with a 7 left renal pelvis stone and associated left symptoms.  Patient previously underwent cystoscopy with balloon dilation of urethral stricture and left ureteral stent placement.  Now returns to the operating room for definitive stone management.  After reviewing the management options for treatment, the patient elected to proceed with the above surgical procedure(s). We have discussed the potential benefits and risks of the procedure, side effects of the proposed treatment, the likelihood of the patient achieving the goals of the procedure, and any potential problems that might occur during the procedure or recuperation. Informed consent has been obtained.   Description of procedure:  The patient was taken to the operating room and general anesthesia was induced.  The patient was placed in the dorsal lithotomy position, prepped and draped in the usual sterile fashion, and preoperative antibiotics were administered. A preoperative time-out was performed.   Cystourethroscopy was performed.  The patient's urethra was examined and findings are noted above.  The bladder was then systematically examined in its entirety. There  was no evidence for any bladder tumors, stones, or other mucosal pathology.    Attention then turned to the left ureteral orifice graspers were used to remove the stent emanating from the left ureteral orifice and bring it to the urethral meatus.  Next a 0.38 sensor wire was advanced through the ureteral stent up to the kidney under fluoroscopic guidance.  The stent was removed.  A second sensor wire was placed through the cystoscope and into the left ureter with fluoroscopic guidance.  The cystoscope was removed and one wire was secured as a safety wire.  Next a ureteral access sheath was then placed over the working wire with fluoroscopic guidance.  The inner wire and sheath were removed.  Flexible ureteroscopy then took place which encountered the stone in the left renal pelvis.  He does have a bit of narrowing of the left UPJ.  The stone was then fragmented with a 200 m laser.  The larger stone fragments were basketed with a 0 tip basket.    All stones were then removed from the ureter with an N-gage nitinol basket.  Reinspection of the ureter revealed no remaining visible stones or fragments.   The wire was then backloaded through the cystoscope and a ureteral stent was advance over the wire using Seldinger technique.  The stent was positioned appropriately under fluoroscopic and cystoscopic guidance.  The wire was then removed with an adequate stent curl noted in the renal pelvis as well as in the bladder.  The bladder was then emptied and the procedure ended.  The patient appeared to tolerate the procedure well and without complications.  The patient was able to be awakened and transferred to the recovery unit in satisfactory condition.   Disposition: The tether  of the stent was left on and secured to the ventral aspect of the patient's penis.Instructions for removing the stent have been provided to the patient.

## 2019-09-21 NOTE — Interval H&P Note (Signed)
History and Physical Interval Note: Patient has undergone cysto, urethral balloon dilation and stent placement and now returns for definitive stone removal.  09/21/2019 5:52 AM  [name]  has presented today for surgery, with the diagnosis of LEFT RENAL CALCULUS.  The various methods of treatment have been discussed with the patient and family. After consideration of risks, benefits and other options for treatment, the patient has consented to  Procedure(s) with comments: CYSTOSCOPY WITH RETROGRADE PYELOGRAM, URETEROSCOPY AND STENT EXCHANGE (Left) - 1 HR HOLMIUM LASER APPLICATION (Left) as a surgical intervention.  The patient's history has been reviewed, patient examined, no change in status, stable for surgery.  I have reviewed the patient's chart and labs.  Questions were answered to the patient's satisfaction.     Kerri Asche D Ameyah Bangura

## 2019-09-21 NOTE — Transfer of Care (Signed)
Immediate Anesthesia Transfer of Care Note  Patient: Gabriel Coleman  Procedure(s) Performed: CYSTOSCOPY WITH RETROGRADE PYELOGRAM, URETEROSCOPY AND STENT EXCHANGE (Left Urethra) HOLMIUM LASER APPLICATION (Left Urethra)  Patient Location: PACU  Anesthesia Type:General  Level of Consciousness: awake, alert  and oriented  Airway & Oxygen Therapy: Patient Spontanous Breathing and Patient connected to face mask oxygen  Post-op Assessment: Report given to RN, Post -op Vital signs reviewed and stable and Patient moving all extremities X 4  Post vital signs: Reviewed and stable  Last Vitals:  Vitals Value Taken Time  BP    Temp    Pulse 78 09/21/19 0844  Resp 18 09/21/19 0844  SpO2 94 % 09/21/19 0844  Vitals shown include unvalidated device data.  Last Pain:  Vitals:   09/21/19 0653  TempSrc: Oral  PainSc: 0-No pain      Patients Stated Pain Goal: 2 (59/74/71 8550)  Complications: No complications documented.

## 2019-09-21 NOTE — Anesthesia Postprocedure Evaluation (Signed)
Anesthesia Post Note  Patient: Gabriel Coleman  Procedure(s) Performed: CYSTOSCOPY WITH RETROGRADE PYELOGRAM, URETEROSCOPY AND STENT EXCHANGE (Left Urethra) HOLMIUM LASER APPLICATION (Left Urethra)     Patient location during evaluation: PACU Anesthesia Type: General Level of consciousness: awake and alert and oriented Pain management: pain level controlled Vital Signs Assessment: post-procedure vital signs reviewed and stable Respiratory status: spontaneous breathing, nonlabored ventilation and respiratory function stable Cardiovascular status: blood pressure returned to baseline and stable Postop Assessment: no apparent nausea or vomiting Anesthetic complications: no   No complications documented.  Last Vitals:  Vitals:   09/21/19 0845 09/21/19 0906  BP: (!) 142/91   Pulse: 80 65  Resp: 19 17  Temp:  36.8 C  SpO2: 93% 98%    Last Pain:  Vitals:   09/21/19 0906  TempSrc: Oral  PainSc:                  [name] A.

## 2019-09-21 NOTE — Anesthesia Procedure Notes (Signed)
Procedure Name: LMA Insertion Date/Time: 09/21/2019 7:30 AM Performed by: Niel Hummer, CRNA Pre-anesthesia Checklist: Patient identified, Emergency Drugs available, Suction available and Patient being monitored Patient Re-evaluated:Patient Re-evaluated prior to induction Oxygen Delivery Method: Circle system utilized Preoxygenation: Pre-oxygenation with 100% oxygen Induction Type: IV induction LMA: LMA inserted LMA Size: 5.0 Number of attempts: 1 Dental Injury: Teeth and Oropharynx as per pre-operative assessment

## 2019-09-21 NOTE — Discharge Instructions (Signed)
°  Post Anesthesia Home Care Instructions  Activity: Get plenty of rest for the remainder of the day. A responsible adult should stay with you for 24 hours following the procedure.  For the next 24 hours, DO NOT: -Drive a car -Paediatric nurse -Drink alcoholic beverages -Take any medication unless instructed by your physician -Make any legal decisions or sign important papers.  Meals: Start with liquid foods such as gelatin or soup. Progress to regular foods as tolerated. Avoid greasy, spicy, heavy foods. If nausea and/or vomiting occur, drink only clear liquids until the nausea and/or vomiting subsides. Call your physician if vomiting continues.  Special Instructions/Symptoms: Your throat may feel dry or sore from the anesthesia or the breathing tube placed in your throat during surgery. If this causes discomfort, gargle with warm salt water. The discomfort should disappear within 24 hours.  If you had a scopolamine patch placed behind your ear for the management of post- operative nausea and/or vomiting:  1. The medication in the patch is effective for 72 hours, after which it should be removed.  Wrap patch in a tissue and discard in the trash. Wash hands thoroughly with soap and water. 2. You may remove the patch earlier than 72 hours if you experience unpleasant side effects which may include dry mouth, dizziness or visual disturbances. 3. Avoid touching the patch. Wash your hands with soap and water after contact with the patch.   DISCHARGE INSTRUCTIONS FOR KIDNEY STONE/URETERAL STENT   MEDICATIONS:  1.  Resume all your other meds from home - except do not take any extra narcotic pain meds that you may have at home.  2. Pyridium is to help with the burning/stinging when you urinate. 3. Tramadol is for moderate/severe pain, otherwise taking upto 1000 mg every 6 hours of plainTylenol will help treat your pain.   4. Take Cipro one hour prior to removal of your stent.    ACTIVITY:    1. No strenuous activity x 1week  2. No driving while on narcotic pain medications  3. Drink plenty of water  4. Continue to walk at home - you can still get blood clots when you are at home, so keep active, but don't over do it.  5. May return to work/school tomorrow or when you feel ready   BATHING:  1. You can shower and we recommend daily showers  2. You have a string coming from your urethra: The stent string is attached to your ureteral stent. Do not pull on this.   SIGNS/SYMPTOMS TO CALL:  Please call us if you have a fever greater than 101.5, uncontrolled nausea/vomiting, uncontrolled pain, dizziness, unable to urinate, bloody urine, chest pain, shortness of breath, leg swelling, leg pain, redness around wound, drainage from wound, or any other concerns or questions.   You can reach [name] at [phone].   FOLLOW-UP:  1.  You have a string attached to your stent that is coming out of your penis. You may remove it on Friday, 8/20. To do this, pull the string until the stent is completely removed. You may feel an odd sensation in your back.

## 2019-09-22 ENCOUNTER — Encounter (HOSPITAL_BASED_OUTPATIENT_CLINIC_OR_DEPARTMENT_OTHER): Payer: Self-pay | Admitting: Urology

## 2019-09-26 ENCOUNTER — Encounter: Payer: Self-pay | Admitting: Gastroenterology

## 2019-10-04 ENCOUNTER — Other Ambulatory Visit: Payer: Self-pay | Admitting: Internal Medicine

## 2019-10-04 NOTE — Telephone Encounter (Signed)
Please refill as per office routine med refill policy (all routine meds refilled for 3 mo or monthly per pt preference up to one year from last visit, then month to month grace period for 3 mo, then further med refills will have to be denied)  

## 2019-10-14 ENCOUNTER — Other Ambulatory Visit: Payer: Self-pay | Admitting: Endocrinology

## 2019-10-21 ENCOUNTER — Ambulatory Visit (INDEPENDENT_AMBULATORY_CARE_PROVIDER_SITE_OTHER): Payer: 59 | Admitting: Endocrinology

## 2019-10-21 ENCOUNTER — Other Ambulatory Visit (INDEPENDENT_AMBULATORY_CARE_PROVIDER_SITE_OTHER): Payer: 59

## 2019-10-21 ENCOUNTER — Other Ambulatory Visit: Payer: Self-pay

## 2019-10-21 VITALS — BP 133/88 | HR 73 | Ht 72.0 in | Wt 234.0 lb

## 2019-10-21 DIAGNOSIS — E0590 Thyrotoxicosis, unspecified without thyrotoxic crisis or storm: Secondary | ICD-10-CM | POA: Diagnosis not present

## 2019-10-21 LAB — T4, FREE: Free T4: 0.82 ng/dL (ref 0.60–1.60)

## 2019-10-21 LAB — TSH: TSH: 1.04 u[IU]/mL (ref 0.35–4.50)

## 2019-10-21 NOTE — Patient Instructions (Signed)
Blood tests are requested for you today.  We'll let you know about the results.  If ever you have fever while taking methimazole, stop it and call us, even if the reason is obvious, because of the risk of a rare side-effect. It is best to never miss the medication.  However, if you do miss it, next best is to double up the next time.   Please come back for a follow-up appointment in 6 months.   
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
## 2019-11-12 ENCOUNTER — Other Ambulatory Visit: Payer: Self-pay | Admitting: Internal Medicine

## 2019-11-12 NOTE — Telephone Encounter (Signed)
Please refill as per office routine med refill policy (all routine meds refilled for 3 mo or monthly per pt preference up to one year from last visit, then month to month grace period for 3 mo, then further med refills will have to be denied)  

## 2019-11-15 ENCOUNTER — Other Ambulatory Visit: Payer: Self-pay

## 2019-11-15 MED ORDER — METFORMIN HCL ER 500 MG PO TB24: ORAL_TABLET | ORAL | 0 refills | Status: DC

## 2019-11-19 ENCOUNTER — Telehealth: Payer: Self-pay | Admitting: Internal Medicine

## 2019-11-19 NOTE — Telephone Encounter (Signed)
Sent to Dr. John to advise. 

## 2019-11-19 NOTE — Telephone Encounter (Signed)
metFORMIN (GLUCOPHAGE-XR) 500 MG 24 hr tablet Patient calling because his previous RX stated to take 4 tablets with breakfast and on his current RX it states to only take one with breakfast so he was not sure if this was correct Pt can be contacted @ [phone]

## 2019-11-20 MED ORDER — METFORMIN HCL ER 500 MG PO TB24: ORAL_TABLET | ORAL | 2 refills | Status: DC

## 2019-11-20 NOTE — Addendum Note (Signed)
Addended by: Biagio Borg on: 11/20/2019 03:03 PM   Modules accepted: Orders

## 2019-11-20 NOTE — Telephone Encounter (Signed)
Not sure how that happened, but is now corrected

## 2020-01-18 ENCOUNTER — Telehealth: Payer: Self-pay | Admitting: Internal Medicine

## 2020-01-18 ENCOUNTER — Encounter: Payer: Self-pay | Admitting: Internal Medicine

## 2020-01-18 ENCOUNTER — Other Ambulatory Visit: Payer: Self-pay

## 2020-01-18 ENCOUNTER — Ambulatory Visit (INDEPENDENT_AMBULATORY_CARE_PROVIDER_SITE_OTHER): Payer: 59 | Admitting: Internal Medicine

## 2020-01-18 VITALS — BP 160/100 | HR 76 | Temp 99.2°F | Ht 72.0 in | Wt 234.0 lb

## 2020-01-18 DIAGNOSIS — E7849 Other hyperlipidemia: Secondary | ICD-10-CM

## 2020-01-18 DIAGNOSIS — E785 Hyperlipidemia, unspecified: Secondary | ICD-10-CM | POA: Insufficient documentation

## 2020-01-18 DIAGNOSIS — I10 Essential (primary) hypertension: Secondary | ICD-10-CM

## 2020-01-18 DIAGNOSIS — M19032 Primary osteoarthritis, left wrist: Secondary | ICD-10-CM

## 2020-01-18 DIAGNOSIS — E1165 Type 2 diabetes mellitus with hyperglycemia: Secondary | ICD-10-CM

## 2020-01-18 DIAGNOSIS — R509 Fever, unspecified: Secondary | ICD-10-CM | POA: Insufficient documentation

## 2020-01-18 DIAGNOSIS — R310 Gross hematuria: Secondary | ICD-10-CM

## 2020-01-18 DIAGNOSIS — J449 Chronic obstructive pulmonary disease, unspecified: Secondary | ICD-10-CM

## 2020-01-18 DIAGNOSIS — I700 Atherosclerosis of aorta: Secondary | ICD-10-CM

## 2020-01-18 LAB — BASIC METABOLIC PANEL
BUN: 13 mg/dL (ref 6–23)
CO2: 30 mEq/L (ref 19–32)
Calcium: 10 mg/dL (ref 8.4–10.5)
Chloride: 100 mEq/L (ref 96–112)
Creatinine, Ser: 0.94 mg/dL (ref 0.40–1.50)
GFR: 85.99 mL/min (ref >60.00)
Glucose, Bld: 121 mg/dL — ABNORMAL HIGH (ref 70–99)
Potassium: 3.8 mEq/L (ref 3.5–5.1)
Sodium: 138 mEq/L (ref 135–145)

## 2020-01-18 LAB — URINALYSIS, ROUTINE W REFLEX MICROSCOPIC
Bilirubin Urine: NEGATIVE
Hgb urine dipstick: NEGATIVE
Ketones, ur: NEGATIVE
Leukocytes,Ua: NEGATIVE
Nitrite: NEGATIVE
RBC / HPF: NONE SEEN (ref 0–?)
Specific Gravity, Urine: 1.02 (ref 1.000–1.030)
Total Protein, Urine: NEGATIVE
Urine Glucose: NEGATIVE
Urobilinogen, UA: 0.2 (ref 0.0–1.0)
pH: 7.5 (ref 5.0–8.0)

## 2020-01-18 LAB — HEPATIC FUNCTION PANEL
ALT: 17 U/L (ref 0–53)
AST: 15 U/L (ref 0–37)
Albumin: 4.3 g/dL (ref 3.5–5.2)
Alkaline Phosphatase: 45 U/L (ref 39–117)
Bilirubin, Direct: 0.1 mg/dL (ref 0.0–0.3)
Total Bilirubin: 0.5 mg/dL (ref 0.2–1.2)
Total Protein: 7.5 g/dL (ref 6.0–8.3)

## 2020-01-18 LAB — LIPID PANEL
Cholesterol: 135 mg/dL (ref 0–200)
HDL: 39.6 mg/dL (ref >39.00)
LDL Cholesterol: 69 mg/dL (ref 0–99)
NonHDL: 95.19
Total CHOL/HDL Ratio: 3
Triglycerides: 132 mg/dL (ref 0.0–149.0)
VLDL: 26.4 mg/dL (ref 0.0–40.0)

## 2020-01-18 LAB — HEMOGLOBIN A1C: Hgb A1c MFr Bld: 6.6 % — ABNORMAL HIGH (ref 4.6–6.5)

## 2020-01-18 NOTE — Patient Instructions (Signed)

## 2020-01-18 NOTE — Progress Notes (Deleted)
   Subjective:    Patient ID: Gabriel Coleman, male    DOB: 1955-06-15, 64 y.o.   MRN: [id number]  HPI Here to f/u; overall doing ok,  Pt denies chest pain, increasing sob or doe, wheezing, orthopnea, PND, increased LE swelling, palpitations, dizziness or syncope.  Pt denies new neurological symptoms such as new headache, or facial or extremity weakness or numbness.  Pt denies polydipsia, polyuria, or low sugar episode.  Pt states overall good compliance with meds, mostly trying to follow appropriate diet, with wt overall stable,  CBGs recetnly never over 120.      Review of Systems     Objective:   Physical Exam    Lab Results  Component Value Date   WBC 13.2 (H) 08/02/2019   HGB 15.0 09/21/2019   HCT 44.0 09/21/2019   PLT 233 08/02/2019   GLUCOSE 120 (H) 09/21/2019   CHOL 144 07/19/2019   TRIG 187.0 (H) 07/19/2019   HDL 36.70 (L) 07/19/2019   LDLDIRECT 122.0 10/02/2017   LDLCALC 70 07/19/2019   ALT 20 08/02/2019   AST 20 08/02/2019   NA 141 09/21/2019   K 4.3 09/21/2019   CL 103 09/21/2019   CREATININE 0.90 09/21/2019   BUN 14 09/21/2019   CO2 22 08/02/2019   TSH 1.04 10/21/2019   PSA 1.03 07/19/2019   INR 0.94 11/15/2012   HGBA1C 7.2 (H) 07/19/2019   MICROALBUR 23.6 (H) 07/19/2019       Assessment & Plan:

## 2020-01-18 NOTE — Telephone Encounter (Signed)
Spouse calling to report patient recently had flu shot on 11/13 at local [facility]

## 2020-01-20 ENCOUNTER — Encounter: Payer: Self-pay | Admitting: Internal Medicine

## 2020-02-03 ENCOUNTER — Encounter: Payer: Self-pay | Admitting: Internal Medicine

## 2020-02-03 DIAGNOSIS — M19032 Primary osteoarthritis, left wrist: Secondary | ICD-10-CM | POA: Insufficient documentation

## 2020-02-03 DIAGNOSIS — I700 Atherosclerosis of aorta: Secondary | ICD-10-CM | POA: Insufficient documentation

## 2020-02-03 NOTE — Assessment & Plan Note (Signed)
No lifting more than 15 lbs, f/u ortho as planned

## 2020-02-03 NOTE — Assessment & Plan Note (Signed)
stable overall by history and exam, recent data reviewed with pt, and pt to continue medical treatment as before,  to f/u any worsening symptoms or concerns, to continue inhalers as directed

## 2020-02-03 NOTE — Assessment & Plan Note (Signed)
Lab Results  Component Value Date   HGBA1C 6.6 (H) 01/18/2020  stable overall by history and exam, recent data reviewed with pt, and pt to continue medical treatment as before,  to f/u any worsening symptoms or concerns

## 2020-02-03 NOTE — Assessment & Plan Note (Signed)
Lab Results  Component Value Date   LDLCALC 69 01/18/2020  stable overall by history and exam, recent data reviewed with pt, and pt to continue medical treatment as before,  to f/u any worsening symptoms or concerns

## 2020-02-03 NOTE — Assessment & Plan Note (Signed)
Does not appear acutely ill, also for UA today

## 2020-02-03 NOTE — Assessment & Plan Note (Signed)
Uncontrolled today o/w stable overall by history and exam, recent data reviewed with pt, and pt to continue medical treatment as before,  to f/u any worsening symptoms or concerns, d/w pt - did not take BP med this am, plans to check at home and let us know average in 10 days BP Readings from Last 3 Encounters:  01/18/20 (!) 160/100  10/21/19 133/88  09/21/19 (!) 162/104
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
## 2020-02-03 NOTE — Assessment & Plan Note (Signed)
Small intermittent 1 mo ago, to f/u urology as planned

## 2020-02-03 NOTE — Assessment & Plan Note (Signed)
stable overall by history and exam, recent data reviewed with pt, and pt to continue medical treatment as before,  to f/u any worsening symptoms or concerns, to continue statin 

## 2020-04-03 ENCOUNTER — Other Ambulatory Visit: Payer: Self-pay | Admitting: Internal Medicine

## 2020-04-03 NOTE — Telephone Encounter (Signed)
Please refill as per office routine med refill policy (all routine meds refilled for 3 mo or monthly per pt preference up to one year from last visit, then month to month grace period for 3 mo, then further med refills will have to be denied)  

## 2020-04-19 ENCOUNTER — Ambulatory Visit: Payer: 59 | Admitting: Endocrinology

## 2020-04-26 ENCOUNTER — Other Ambulatory Visit: Payer: Self-pay | Admitting: Endocrinology

## 2020-05-16 ENCOUNTER — Ambulatory Visit (INDEPENDENT_AMBULATORY_CARE_PROVIDER_SITE_OTHER): Payer: 59 | Admitting: Endocrinology

## 2020-05-16 ENCOUNTER — Other Ambulatory Visit: Payer: Self-pay

## 2020-05-16 VITALS — BP 150/80 | HR 69 | Ht 73.0 in | Wt 231.0 lb

## 2020-05-16 DIAGNOSIS — E1165 Type 2 diabetes mellitus with hyperglycemia: Secondary | ICD-10-CM | POA: Diagnosis not present

## 2020-05-16 DIAGNOSIS — E0590 Thyrotoxicosis, unspecified without thyrotoxic crisis or storm: Secondary | ICD-10-CM | POA: Diagnosis not present

## 2020-05-16 LAB — TSH: TSH: 1.1 u[IU]/mL (ref 0.35–4.50)

## 2020-05-16 LAB — T4, FREE: Free T4: 0.89 ng/dL (ref 0.60–1.60)

## 2020-05-16 NOTE — Patient Instructions (Addendum)
Your blood pressure is high today.  Please see your primary care provider soon, to have it rechecked Blood tests are requested for you today.  We'll let you know about the results. If ever you have fever while taking methimazole, stop it and call us, even if the reason is obvious, because of the risk of a rare side-effect.  It is best to never miss the medication.  However, if you do miss it, next best is to double up the next time.   Please come back for a follow-up appointment in 6 months.
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
## 2020-05-19 MED ORDER — METHIMAZOLE 5 MG PO TABS: 5.0000 mg | ORAL_TABLET | Freq: Every day | ORAL | 3 refills | Status: DC

## 2020-07-04 ENCOUNTER — Other Ambulatory Visit: Payer: Self-pay

## 2020-07-04 ENCOUNTER — Ambulatory Visit (INDEPENDENT_AMBULATORY_CARE_PROVIDER_SITE_OTHER): Payer: 59 | Admitting: Internal Medicine

## 2020-07-04 ENCOUNTER — Encounter: Payer: Self-pay | Admitting: Internal Medicine

## 2020-07-04 VITALS — BP 140/88 | HR 89 | Temp 100.0°F | Ht 73.0 in | Wt 231.0 lb

## 2020-07-04 DIAGNOSIS — J441 Chronic obstructive pulmonary disease with (acute) exacerbation: Secondary | ICD-10-CM | POA: Diagnosis not present

## 2020-07-04 DIAGNOSIS — I700 Atherosclerosis of aorta: Secondary | ICD-10-CM

## 2020-07-04 DIAGNOSIS — Z0001 Encounter for general adult medical examination with abnormal findings: Secondary | ICD-10-CM | POA: Diagnosis not present

## 2020-07-04 DIAGNOSIS — E7849 Other hyperlipidemia: Secondary | ICD-10-CM | POA: Diagnosis not present

## 2020-07-04 DIAGNOSIS — E559 Vitamin D deficiency, unspecified: Secondary | ICD-10-CM

## 2020-07-04 DIAGNOSIS — I10 Essential (primary) hypertension: Secondary | ICD-10-CM

## 2020-07-04 DIAGNOSIS — Z125 Encounter for screening for malignant neoplasm of prostate: Secondary | ICD-10-CM | POA: Diagnosis not present

## 2020-07-04 DIAGNOSIS — E538 Deficiency of other specified B group vitamins: Secondary | ICD-10-CM

## 2020-07-04 DIAGNOSIS — M19032 Primary osteoarthritis, left wrist: Secondary | ICD-10-CM | POA: Diagnosis not present

## 2020-07-04 DIAGNOSIS — E1165 Type 2 diabetes mellitus with hyperglycemia: Secondary | ICD-10-CM

## 2020-07-04 LAB — CBC WITH DIFFERENTIAL/PLATELET
Basophils Absolute: 0 10*3/uL (ref 0.0–0.1)
Basophils Relative: 0.4 % (ref 0.0–3.0)
Eosinophils Absolute: 0.1 10*3/uL (ref 0.0–0.7)
Eosinophils Relative: 1.5 % (ref 0.0–5.0)
HCT: 45.3 % (ref 39.0–52.0)
Hemoglobin: 15.6 g/dL (ref 13.0–17.0)
Lymphocytes Relative: 34.1 % (ref 12.0–46.0)
Lymphs Abs: 2.5 10*3/uL (ref 0.7–4.0)
MCHC: 34.5 g/dL (ref 30.0–36.0)
MCV: 87.7 fl (ref 78.0–100.0)
Monocytes Absolute: 0.7 10*3/uL (ref 0.1–1.0)
Monocytes Relative: 9.2 % (ref 3.0–12.0)
Neutro Abs: 4 10*3/uL (ref 1.4–7.7)
Neutrophils Relative %: 54.8 % (ref 43.0–77.0)
Platelets: 204 10*3/uL (ref 150.0–400.0)
RBC: 5.17 Mil/uL (ref 4.22–5.81)
RDW: 13.1 % (ref 11.5–15.5)
WBC: 7.4 10*3/uL (ref 4.0–10.5)

## 2020-07-04 LAB — LIPID PANEL
Cholesterol: 137 mg/dL (ref 0–200)
HDL: 35.9 mg/dL — ABNORMAL LOW (ref >39.00)
NonHDL: 100.66
Total CHOL/HDL Ratio: 4
Triglycerides: 279 mg/dL — ABNORMAL HIGH (ref 0.0–149.0)
VLDL: 55.8 mg/dL — ABNORMAL HIGH (ref 0.0–40.0)

## 2020-07-04 LAB — MICROALBUMIN / CREATININE URINE RATIO
Creatinine,U: 141.8 mg/dL
Microalb Creat Ratio: 1.2 mg/g (ref 0.0–30.0)
Microalb, Ur: 1.7 mg/dL (ref 0.0–1.9)

## 2020-07-04 LAB — VITAMIN D 25 HYDROXY (VIT D DEFICIENCY, FRACTURES): VITD: 48.58 ng/mL (ref 30.00–100.00)

## 2020-07-04 LAB — TSH: TSH: 0.9 u[IU]/mL (ref 0.35–4.50)

## 2020-07-04 LAB — BASIC METABOLIC PANEL
BUN: 15 mg/dL (ref 6–23)
CO2: 26 mEq/L (ref 19–32)
Calcium: 10.7 mg/dL — ABNORMAL HIGH (ref 8.4–10.5)
Chloride: 101 mEq/L (ref 96–112)
Creatinine, Ser: 1.14 mg/dL (ref 0.40–1.50)
GFR: 68 mL/min (ref >60.00)
Glucose, Bld: 233 mg/dL — ABNORMAL HIGH (ref 70–99)
Potassium: 4 mEq/L (ref 3.5–5.1)
Sodium: 138 mEq/L (ref 135–145)

## 2020-07-04 LAB — HEPATIC FUNCTION PANEL
ALT: 17 U/L (ref 0–53)
AST: 14 U/L (ref 0–37)
Albumin: 4.5 g/dL (ref 3.5–5.2)
Alkaline Phosphatase: 51 U/L (ref 39–117)
Bilirubin, Direct: 0.1 mg/dL (ref 0.0–0.3)
Total Bilirubin: 0.5 mg/dL (ref 0.2–1.2)
Total Protein: 7.4 g/dL (ref 6.0–8.3)

## 2020-07-04 LAB — PSA: PSA: 1.88 ng/mL (ref 0.10–4.00)

## 2020-07-04 LAB — VITAMIN B12: Vitamin B-12: 1041 pg/mL — ABNORMAL HIGH (ref 211–911)

## 2020-07-04 LAB — HEMOGLOBIN A1C: Hgb A1c MFr Bld: 6.8 % — ABNORMAL HIGH (ref 4.6–6.5)

## 2020-07-04 LAB — LDL CHOLESTEROL, DIRECT: Direct LDL: 78 mg/dL

## 2020-07-04 MED ORDER — METHYLPREDNISOLONE ACETATE 80 MG/ML IJ SUSP
80.0000 mg | Freq: Once | INTRAMUSCULAR | Status: AC
  Administered 2020-07-04: 80 mg via INTRAMUSCULAR

## 2020-07-04 MED ORDER — FLUTICASONE-SALMETEROL 500-50 MCG/ACT IN AEPB: 1.0000 | INHALATION_SPRAY | Freq: Two times a day (BID) | RESPIRATORY_TRACT | 3 refills | Status: DC

## 2020-07-04 NOTE — Assessment & Plan Note (Signed)
Ok for [employer]

## 2020-07-04 NOTE — Assessment & Plan Note (Signed)
BP Readings from Last 3 Encounters:  07/04/20 140/88  05/16/20 (!) 150/80  01/18/20 (!) 160/100   Stable, pt to continue medical treatment norvasc, losartan

## 2020-07-04 NOTE — Assessment & Plan Note (Signed)
Lab Results  Component Value Date   LDLCALC 69 01/18/2020   Stable, pt to continue current statin crestor
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
## 2020-07-04 NOTE — Assessment & Plan Note (Signed)
Age and sex appropriate education and counseling updated with regular exercise and diet Referrals for preventative services - none needed Immunizations addressed - shingrix declined Smoking counseling  - none needed Evidence for depression or other mood disorder - none significant Most recent labs reviewed. I have personally reviewed and have noted: 1) the patient's medical and social history 2) The patient's current medications and supplements 3) The patient's height, weight, and BMI have been recorded in the chart

## 2020-07-04 NOTE — Assessment & Plan Note (Signed)
To continue crestor 20, low chol diet, excercise

## 2020-07-04 NOTE — Assessment & Plan Note (Signed)
Lab Results  Component Value Date   HGBA1C 6.6 (H) 01/18/2020   Stable, pt to continue current medical treatment metformin

## 2020-07-04 NOTE — Patient Instructions (Signed)
You had the steroid shot today  Please take all new medication as prescribed - the advair instead of the symbicort  We will try to have the forms filled out soon  Please continue all other medications as before, and refills have been done if requested.  Please have the pharmacy call with any other refills you may need.  Please continue your efforts at being more active, low cholesterol diet, and weight control.  You are otherwise up to date with prevention measures today.  Please keep your appointments with your specialists as you may have planned  Please go to the LAB at the blood drawing area for the tests to be done  You will be contacted by phone if any changes need to be made immediately.  Otherwise, you will receive a letter about your results with an explanation, but please check with MyChart first.  Please remember to sign up for MyChart if you have not done so, as this will be important to you in the future with finding out test results, communicating by private email, and scheduling acute appointments online when needed.  Please make an Appointment to return in 6 months, or sooner if needed

## 2020-07-04 NOTE — Assessment & Plan Note (Signed)
Uncontrolled copd, symbicort too expensive, for change to advair, and depomedrol IM 80

## 2020-07-05 ENCOUNTER — Encounter: Payer: Self-pay | Admitting: Internal Medicine

## 2020-07-05 LAB — URINALYSIS, ROUTINE W REFLEX MICROSCOPIC
Bilirubin Urine: NEGATIVE
Hgb urine dipstick: NEGATIVE
Ketones, ur: NEGATIVE
Leukocytes,Ua: NEGATIVE
Nitrite: NEGATIVE
RBC / HPF: NONE SEEN (ref 0–?)
Specific Gravity, Urine: 1.015 (ref 1.000–1.030)
Total Protein, Urine: NEGATIVE
Urine Glucose: >=1000 — AB
Urobilinogen, UA: 0.2 (ref 0.0–1.0)
pH: 7 (ref 5.0–8.0)

## 2020-07-10 ENCOUNTER — Telehealth: Payer: Self-pay | Admitting: Internal Medicine

## 2020-07-10 NOTE — Telephone Encounter (Signed)
Error

## 2020-07-10 NOTE — Telephone Encounter (Signed)
Patient has dropped off a paper for the provider to fill out along with the rest of his disability paperwork.   Also wants an updates on the papers he brought the day of his appointment 5.31.22   Please call the patient when this is completed.    [phone]

## 2020-07-12 NOTE — Telephone Encounter (Signed)
May 31 papers were done and given to [facility]

## 2020-07-13 NOTE — Telephone Encounter (Signed)
Patient had been advised. Papers were faxed 07/06/20. Patient to come pick up papers

## 2020-07-18 ENCOUNTER — Ambulatory Visit: Payer: 59 | Admitting: Internal Medicine

## 2020-07-19 ENCOUNTER — Ambulatory Visit: Payer: 59 | Admitting: Internal Medicine

## 2020-08-06 ENCOUNTER — Other Ambulatory Visit: Payer: Self-pay | Admitting: Internal Medicine

## 2020-08-06 DIAGNOSIS — I10 Essential (primary) hypertension: Secondary | ICD-10-CM

## 2020-08-06 NOTE — Telephone Encounter (Signed)
Please refill as per office routine med refill policy (all routine meds refilled for 3 mo or monthly per pt preference up to one year from last visit, then month to month grace period for 3 mo, then further med refills will have to be denied)  

## 2020-08-09 NOTE — Telephone Encounter (Signed)
1.Medication Requested: amLODipine (NORVASC) 5 MG tablet   losartan (COZAAR) 100 MG tablet  2. Pharmacy (Name, [place], [place]): [place] [place]  3. On Med List: yes   4. Last Visit with PCP: 07-04-20  5. Next visit date with PCP: 01-03-21   Agent: Please be advised that RX refills may take up to 3 business days. We ask that you follow-up with your pharmacy.

## 2020-08-16 ENCOUNTER — Other Ambulatory Visit: Payer: Self-pay

## 2020-08-16 MED ORDER — LOSARTAN POTASSIUM 100 MG PO TABS: 100.0000 mg | ORAL_TABLET | Freq: Every day | ORAL | 3 refills | Status: DC

## 2020-09-04 ENCOUNTER — Telehealth: Payer: Self-pay | Admitting: Internal Medicine

## 2020-09-04 NOTE — Telephone Encounter (Signed)
1.Medication Requested: metFORMIN (GLUCOPHAGE-XR) 500 MG 24 hr tablet  2. Pharmacy (Name, [place]): [id number] - [address], [place]  Phone:  [phone] Fax:  [phone]   3. On Med List: yes  4. Last Visit with PCP: 05.31.22  5. Next visit date with PCP: 11.30.22   Agent: Please be advised that RX refills may take up to 3 business days. We ask that you follow-up with your pharmacy.

## 2020-09-05 ENCOUNTER — Other Ambulatory Visit: Payer: Self-pay

## 2020-09-05 MED ORDER — METFORMIN HCL ER 500 MG PO TB24: ORAL_TABLET | ORAL | 2 refills | Status: DC

## 2020-11-16 ENCOUNTER — Ambulatory Visit (INDEPENDENT_AMBULATORY_CARE_PROVIDER_SITE_OTHER): Payer: 59 | Admitting: Endocrinology

## 2020-11-16 ENCOUNTER — Other Ambulatory Visit: Payer: Self-pay

## 2020-11-16 VITALS — BP 140/70 | HR 68 | Ht 73.0 in | Wt 231.4 lb

## 2020-11-16 DIAGNOSIS — E1165 Type 2 diabetes mellitus with hyperglycemia: Secondary | ICD-10-CM | POA: Diagnosis not present

## 2020-11-16 DIAGNOSIS — E0590 Thyrotoxicosis, unspecified without thyrotoxic crisis or storm: Secondary | ICD-10-CM | POA: Diagnosis not present

## 2020-11-16 LAB — T4, FREE: Free T4: 0.82 ng/dL (ref 0.60–1.60)

## 2020-11-16 LAB — TSH: TSH: 0.89 u[IU]/mL (ref 0.35–5.50)

## 2020-11-16 LAB — HEMOGLOBIN A1C: Hgb A1c MFr Bld: 6.6 % — ABNORMAL HIGH (ref 4.6–6.5)

## 2020-11-16 MED ORDER — ROSUVASTATIN CALCIUM 20 MG PO TABS: 20.0000 mg | ORAL_TABLET | Freq: Every day | ORAL | 1 refills | Status: DC
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
## 2020-11-16 NOTE — Patient Instructions (Signed)
Your blood pressure is high today.  Please see your primary care provider soon, to have it rechecked Blood tests are requested for you today.  We'll let you know about the results. If ever you have fever while taking methimazole, stop it and call us, even if the reason is obvious, because of the risk of a rare side-effect.  It is best to never miss the medication.  However, if you do miss it, next best is to double up the next time.   Please come back for a follow-up appointment in 6 months.

## 2021-01-03 ENCOUNTER — Ambulatory Visit (INDEPENDENT_AMBULATORY_CARE_PROVIDER_SITE_OTHER): Payer: 59 | Admitting: Internal Medicine

## 2021-01-03 ENCOUNTER — Other Ambulatory Visit: Payer: Self-pay

## 2021-01-03 ENCOUNTER — Encounter: Payer: Self-pay | Admitting: Internal Medicine

## 2021-01-03 VITALS — BP 122/70 | HR 70 | Resp 18 | Ht 73.0 in | Wt 232.2 lb

## 2021-01-03 DIAGNOSIS — M25561 Pain in right knee: Secondary | ICD-10-CM | POA: Diagnosis not present

## 2021-01-03 DIAGNOSIS — E7849 Other hyperlipidemia: Secondary | ICD-10-CM

## 2021-01-03 DIAGNOSIS — K219 Gastro-esophageal reflux disease without esophagitis: Secondary | ICD-10-CM | POA: Diagnosis not present

## 2021-01-03 DIAGNOSIS — Z23 Encounter for immunization: Secondary | ICD-10-CM

## 2021-01-03 DIAGNOSIS — M19031 Primary osteoarthritis, right wrist: Secondary | ICD-10-CM

## 2021-01-03 DIAGNOSIS — G8929 Other chronic pain: Secondary | ICD-10-CM

## 2021-01-03 DIAGNOSIS — E1165 Type 2 diabetes mellitus with hyperglycemia: Secondary | ICD-10-CM | POA: Diagnosis not present

## 2021-01-03 DIAGNOSIS — M25562 Pain in left knee: Secondary | ICD-10-CM

## 2021-01-03 DIAGNOSIS — I10 Essential (primary) hypertension: Secondary | ICD-10-CM

## 2021-01-03 MED ORDER — PANTOPRAZOLE SODIUM 40 MG PO TBEC: 40.0000 mg | DELAYED_RELEASE_TABLET | Freq: Every day | ORAL | 3 refills | Status: DC

## 2021-01-03 NOTE — Patient Instructions (Addendum)
We have discussed the Cardiac CT Score test to measure the calcification level (if any) in your heart arteries.  This test has been ordered in our [place], so please call [facility] directly, as they prefer this, at [phone] to be scheduled.  You had the pneumovax pneumonia shot today  Please take all new medication as prescribed  - the protonix 40 mg per day  Ok to use the OTC Voltaren gel for the wrists and other joint pains  You will be contacted regarding the referral for: Sports Medicine  Please continue all other medications as before, and refills have been done if requested.  Please have the pharmacy call with any other refills you may need.  Please continue your efforts at being more active, low cholesterol diet, and weight control.  Please keep your appointments with your specialists as you may have planned  Please make an Appointment to return in 6 months, or sooner if needed
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
## 2021-01-03 NOTE — Assessment & Plan Note (Signed)
Lab Results  Component Value Date   LDLCALC 69 01/18/2020   Stable, pt to continue current statin crestor 20

## 2021-01-04 ENCOUNTER — Encounter: Payer: Self-pay | Admitting: Internal Medicine

## 2021-01-04 DIAGNOSIS — M25561 Pain in right knee: Secondary | ICD-10-CM | POA: Insufficient documentation

## 2021-01-04 DIAGNOSIS — M19031 Primary osteoarthritis, right wrist: Secondary | ICD-10-CM | POA: Insufficient documentation

## 2021-01-04 DIAGNOSIS — M25562 Pain in left knee: Secondary | ICD-10-CM | POA: Insufficient documentation

## 2021-01-04 NOTE — Assessment & Plan Note (Signed)
D/w pt, for otc volt gel prn, and consider hand surgury referral but declines for now

## 2021-01-04 NOTE — Assessment & Plan Note (Signed)
With mild worsening despite working on diet and anti reflux precautions - for add protonix 40 qd

## 2021-01-04 NOTE — Assessment & Plan Note (Signed)
BP Readings from Last 3 Encounters:  01/03/21 122/70  11/16/20 140/70  07/04/20 140/88   Stable, pt to continue medical treatment norvasc, losartan

## 2021-01-04 NOTE — Assessment & Plan Note (Signed)
Lab Results  Component Value Date   HGBA1C 6.6 (H) 11/16/2020   Stable, pt to continue current medical treatment metformin

## 2021-01-04 NOTE — Assessment & Plan Note (Signed)
D/w pt - for volt gel prn, but also refer sports medicine for possible cortisone

## 2021-02-07 ENCOUNTER — Other Ambulatory Visit: Payer: Self-pay | Admitting: Internal Medicine

## 2021-02-07 DIAGNOSIS — I10 Essential (primary) hypertension: Secondary | ICD-10-CM

## 2021-02-07 NOTE — Telephone Encounter (Signed)
Please refill as per office routine med refill policy (all routine meds to be refilled for 3 mo or monthly (per pt preference) up to one year from last visit, then month to month grace period for 3 mo, then further med refills will have to be denied) ? ?

## 2021-03-28 ENCOUNTER — Ambulatory Visit (INDEPENDENT_AMBULATORY_CARE_PROVIDER_SITE_OTHER)
Admission: RE | Admit: 2021-03-28 | Discharge: 2021-03-28 | Disposition: A | Discharge status: Home / Self Care | Payer: Self-pay | Source: Ambulatory Visit | Attending: Internal Medicine | Admitting: Internal Medicine

## 2021-03-28 ENCOUNTER — Other Ambulatory Visit: Payer: Self-pay

## 2021-03-28 DIAGNOSIS — E7849 Other hyperlipidemia: Secondary | ICD-10-CM

## 2021-03-28 DIAGNOSIS — E1165 Type 2 diabetes mellitus with hyperglycemia: Secondary | ICD-10-CM

## 2021-03-30 ENCOUNTER — Encounter: Payer: Self-pay | Admitting: Internal Medicine

## 2021-03-30 ENCOUNTER — Other Ambulatory Visit: Payer: Self-pay | Admitting: Internal Medicine

## 2021-03-30 DIAGNOSIS — R931 Abnormal findings on diagnostic imaging of heart and coronary circulation: Secondary | ICD-10-CM

## 2021-03-30 MED ORDER — ROSUVASTATIN CALCIUM 40 MG PO TABS: 40.0000 mg | ORAL_TABLET | Freq: Every day | ORAL | 3 refills | Status: DC

## 2021-04-02 ENCOUNTER — Other Ambulatory Visit: Payer: Self-pay

## 2021-04-02 ENCOUNTER — Encounter: Payer: Self-pay | Admitting: Internal Medicine

## 2021-04-02 ENCOUNTER — Ambulatory Visit (INDEPENDENT_AMBULATORY_CARE_PROVIDER_SITE_OTHER): Payer: No Typology Code available for payment source | Admitting: Internal Medicine

## 2021-04-02 VITALS — BP 132/70 | HR 78 | Temp 98.3°F | Ht 73.0 in | Wt 239.0 lb

## 2021-04-02 DIAGNOSIS — E538 Deficiency of other specified B group vitamins: Secondary | ICD-10-CM | POA: Diagnosis not present

## 2021-04-02 DIAGNOSIS — J449 Chronic obstructive pulmonary disease, unspecified: Secondary | ICD-10-CM

## 2021-04-02 DIAGNOSIS — R931 Abnormal findings on diagnostic imaging of heart and coronary circulation: Secondary | ICD-10-CM | POA: Diagnosis not present

## 2021-04-02 DIAGNOSIS — E1165 Type 2 diabetes mellitus with hyperglycemia: Secondary | ICD-10-CM | POA: Diagnosis not present

## 2021-04-02 DIAGNOSIS — N529 Male erectile dysfunction, unspecified: Secondary | ICD-10-CM | POA: Insufficient documentation

## 2021-04-02 DIAGNOSIS — Z0001 Encounter for general adult medical examination with abnormal findings: Secondary | ICD-10-CM

## 2021-04-02 DIAGNOSIS — I700 Atherosclerosis of aorta: Secondary | ICD-10-CM | POA: Diagnosis not present

## 2021-04-02 DIAGNOSIS — E7849 Other hyperlipidemia: Secondary | ICD-10-CM | POA: Diagnosis not present

## 2021-04-02 DIAGNOSIS — E559 Vitamin D deficiency, unspecified: Secondary | ICD-10-CM | POA: Diagnosis not present

## 2021-04-02 DIAGNOSIS — I10 Essential (primary) hypertension: Secondary | ICD-10-CM | POA: Diagnosis not present

## 2021-04-02 LAB — BASIC METABOLIC PANEL
BUN: 15 mg/dL (ref 6–23)
CO2: 29 mEq/L (ref 19–32)
Calcium: 10.5 mg/dL (ref 8.4–10.5)
Chloride: 99 mEq/L (ref 96–112)
Creatinine, Ser: 0.95 mg/dL (ref 0.40–1.50)
GFR: 84.19 mL/min (ref >60.00)
Glucose, Bld: 161 mg/dL — ABNORMAL HIGH (ref 70–99)
Potassium: 3.9 mEq/L (ref 3.5–5.1)
Sodium: 135 mEq/L (ref 135–145)

## 2021-04-02 LAB — CBC WITH DIFFERENTIAL/PLATELET
Basophils Absolute: 0 10*3/uL (ref 0.0–0.1)
Basophils Relative: 0.3 % (ref 0.0–3.0)
Eosinophils Absolute: 0.1 10*3/uL (ref 0.0–0.7)
Eosinophils Relative: 1.4 % (ref 0.0–5.0)
HCT: 45.2 % (ref 39.0–52.0)
Hemoglobin: 15.2 g/dL (ref 13.0–17.0)
Lymphocytes Relative: 37.6 % (ref 12.0–46.0)
Lymphs Abs: 2.8 10*3/uL (ref 0.7–4.0)
MCHC: 33.6 g/dL (ref 30.0–36.0)
MCV: 87.7 fl (ref 78.0–100.0)
Monocytes Absolute: 0.8 10*3/uL (ref 0.1–1.0)
Monocytes Relative: 10.6 % (ref 3.0–12.0)
Neutro Abs: 3.7 10*3/uL (ref 1.4–7.7)
Neutrophils Relative %: 50.1 % (ref 43.0–77.0)
Platelets: 227 10*3/uL (ref 150.0–400.0)
RBC: 5.15 Mil/uL (ref 4.22–5.81)
RDW: 13.2 % (ref 11.5–15.5)
WBC: 7.3 10*3/uL (ref 4.0–10.5)

## 2021-04-02 LAB — HEMOGLOBIN A1C: Hgb A1c MFr Bld: 7 % — ABNORMAL HIGH (ref 4.6–6.5)

## 2021-04-02 LAB — HEPATIC FUNCTION PANEL
ALT: 18 U/L (ref 0–53)
AST: 15 U/L (ref 0–37)
Albumin: 4.7 g/dL (ref 3.5–5.2)
Alkaline Phosphatase: 52 U/L (ref 39–117)
Bilirubin, Direct: 0.1 mg/dL (ref 0.0–0.3)
Total Bilirubin: 0.5 mg/dL (ref 0.2–1.2)
Total Protein: 7.8 g/dL (ref 6.0–8.3)

## 2021-04-02 LAB — URINALYSIS, ROUTINE W REFLEX MICROSCOPIC
Bilirubin Urine: NEGATIVE
Hgb urine dipstick: NEGATIVE
Ketones, ur: NEGATIVE
Leukocytes,Ua: NEGATIVE
Nitrite: NEGATIVE
RBC / HPF: NONE SEEN (ref 0–?)
Specific Gravity, Urine: 1.015 (ref 1.000–1.030)
Total Protein, Urine: NEGATIVE
Urine Glucose: NEGATIVE
Urobilinogen, UA: 0.2 (ref 0.0–1.0)
pH: 7 (ref 5.0–8.0)

## 2021-04-02 LAB — LDL CHOLESTEROL, DIRECT: Direct LDL: 111 mg/dL

## 2021-04-02 LAB — LIPID PANEL
Cholesterol: 170 mg/dL (ref 0–200)
HDL: 40.4 mg/dL (ref >39.00)
NonHDL: 129.56
Total CHOL/HDL Ratio: 4
Triglycerides: 229 mg/dL — ABNORMAL HIGH (ref 0.0–149.0)
VLDL: 45.8 mg/dL — ABNORMAL HIGH (ref 0.0–40.0)

## 2021-04-02 MED ORDER — SILDENAFIL CITRATE 100 MG PO TABS: 100.0000 mg | ORAL_TABLET | Freq: Every day | ORAL | 11 refills | Status: AC | PRN

## 2021-04-02 NOTE — Assessment & Plan Note (Signed)
Lab Results  Component Value Date   HGBA1C 6.6 (H) 11/16/2020   Stable, pt to continue current medical treatment metformin, refer DM education

## 2021-04-02 NOTE — Patient Instructions (Addendum)
Please take all new medication as prescribed- the sildenafil  You will be contacted regarding the referral for: Dietician  Please continue all other medications as before, and refills have been done if requested.  Please have the pharmacy call with any other refills you may need.  Please continue your efforts at being more active, low cholesterol diet, and weight control.  You are otherwise up to date with prevention measures today.  Please keep your appointments with your specialists as you may have planned - cardiology  Please go to the LAB at the blood drawing area for the tests to be done  You will be contacted by phone if any changes need to be made immediately.  Otherwise, you will receive a letter about your results with an explanation, but please check with MyChart first.  Please remember to sign up for MyChart if you have not done so, as this will be important to you in the future with finding out test results, communicating by private email, and scheduling acute appointments online when needed.  Please make an Appointment to return in 6 months, or sooner if needed

## 2021-04-02 NOTE — Assessment & Plan Note (Signed)
Pt to continue crestor, DM/low chol diet, refer DM education, and f/u cardiology as referred

## 2021-04-02 NOTE — Assessment & Plan Note (Signed)
Age and sex appropriate education and counseling updated with regular exercise and diet Referrals for preventative services - none needed Immunizations addressed - decliens covid booster, shingrix Smoking counseling  - none needed Evidence for depression or other mood disorder - none significant Most recent labs reviewed. I have personally reviewed and have noted: 1) the patient's medical and social history 2) The patient's current medications and supplements 3) The patient's height, weight, and BMI have been recorded in the chart  

## 2021-04-02 NOTE — Assessment & Plan Note (Signed)
Recent worsening, for viagra prn,  to f/u any worsening symptoms or concerns 

## 2021-04-02 NOTE — Assessment & Plan Note (Signed)
BP Readings from Last 3 Encounters:  04/02/21 132/70  01/03/21 122/70  11/16/20 140/70   Stable, pt to continue medical treatment losartan

## 2021-04-02 NOTE — Assessment & Plan Note (Signed)
Pt to continue low chol DM diet, crestor, excercise

## 2021-04-02 NOTE — Assessment & Plan Note (Signed)
Stable, cont inhaler prn 

## 2021-04-02 NOTE — Assessment & Plan Note (Signed)
Last vitamin D Lab Results  Component Value Date   VD25OH 48.58 07/04/2020   Stable, cont oral replacement

## 2021-04-02 NOTE — Assessment & Plan Note (Signed)
Lab Results  Component Value Date   LDLCALC 69 01/18/2020   Stable, pt to continue current statin crestor
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
## 2021-04-03 LAB — MICROALBUMIN / CREATININE URINE RATIO
Creatinine,U: 108.7 mg/dL
Microalb Creat Ratio: 0.9 mg/g (ref 0.0–30.0)
Microalb, Ur: 1 mg/dL (ref 0.0–1.9)

## 2021-04-03 LAB — PSA: PSA: 1.51 ng/mL (ref 0.10–4.00)

## 2021-04-03 LAB — TSH: TSH: 0.66 u[IU]/mL (ref 0.35–5.50)

## 2021-04-03 LAB — VITAMIN D 25 HYDROXY (VIT D DEFICIENCY, FRACTURES): VITD: 49.33 ng/mL (ref 30.00–100.00)

## 2021-04-03 LAB — VITAMIN B12: Vitamin B-12: 1029 pg/mL — ABNORMAL HIGH (ref 211–911)

## 2021-04-09 ENCOUNTER — Telehealth: Payer: Self-pay

## 2021-04-09 DIAGNOSIS — E1165 Type 2 diabetes mellitus with hyperglycemia: Secondary | ICD-10-CM

## 2021-04-09 NOTE — Telephone Encounter (Signed)
Pt states that the referral that was place for NUTRITION AND DIABETES MANAGEMENT  is not in network with his insurance. Pt would like to go be seen in network. ? ?Pt [phone] ?
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
## 2021-04-17 ENCOUNTER — Ambulatory Visit: Payer: No Typology Code available for payment source | Admitting: Interventional Cardiology

## 2021-04-17 ENCOUNTER — Other Ambulatory Visit: Payer: Self-pay

## 2021-04-17 ENCOUNTER — Encounter: Payer: Self-pay | Admitting: Interventional Cardiology

## 2021-04-17 VITALS — BP 142/102 | HR 73 | Ht 73.0 in | Wt 239.8 lb

## 2021-04-17 DIAGNOSIS — E1165 Type 2 diabetes mellitus with hyperglycemia: Secondary | ICD-10-CM | POA: Diagnosis not present

## 2021-04-17 DIAGNOSIS — I700 Atherosclerosis of aorta: Secondary | ICD-10-CM

## 2021-04-17 DIAGNOSIS — I10 Essential (primary) hypertension: Secondary | ICD-10-CM

## 2021-04-17 DIAGNOSIS — R072 Precordial pain: Secondary | ICD-10-CM | POA: Diagnosis not present

## 2021-04-17 DIAGNOSIS — E7849 Other hyperlipidemia: Secondary | ICD-10-CM | POA: Diagnosis not present

## 2021-04-17 DIAGNOSIS — R931 Abnormal findings on diagnostic imaging of heart and coronary circulation: Secondary | ICD-10-CM | POA: Diagnosis not present

## 2021-04-17 LAB — BASIC METABOLIC PANEL
BUN/Creatinine Ratio: 13 (ref 10–24)
BUN: 12 mg/dL (ref 8–27)
CO2: 24 mmol/L (ref 20–29)
Calcium: 10.1 mg/dL (ref 8.6–10.2)
Chloride: 100 mmol/L (ref 96–106)
Creatinine, Ser: 0.91 mg/dL (ref 0.76–1.27)
Glucose: 121 mg/dL — ABNORMAL HIGH (ref 70–99)
Potassium: 4.3 mmol/L (ref 3.5–5.2)
Sodium: 138 mmol/L (ref 134–144)
eGFR: 94 mL/min/{1.73_m2} (ref >59)

## 2021-04-17 MED ORDER — METOPROLOL TARTRATE 100 MG PO TABS: ORAL_TABLET | ORAL | 0 refills | Status: DC

## 2021-04-17 NOTE — Patient Instructions (Signed)
Medication Instructions:  ?Your physician recommends that you continue on your current medications as directed. Please refer to the Current Medication list given to you today. ? ?*If you need a refill on your cardiac medications before your next appointment, please call your pharmacy* ? ? ?Lab Work: ?BMET today ? ?If you have labs (blood work) drawn today and your tests are completely normal, you will receive your results only by: ?MyChart Message (if you have MyChart) OR ?A paper copy in the mail ?If you have any lab test that is abnormal or we need to change your treatment, we will call you to review the results. ? ? ?Testing/Procedures: ?Your physician recommends that you have a Coronary CT performed. ? ? ?Follow-Up: ?At [facility], you and your health needs are our priority.  As part of our continuing mission to provide you with exceptional heart care, we have created designated Provider Care Teams.  These Care Teams include your primary Cardiologist (physician) and Advanced Practice Providers (APPs -  Physician Assistants and Nurse Practitioners) who all work together to provide you with the care you need, when you need it. ? ?We recommend signing up for the patient portal called "MyChart".  Sign up information is provided on this After Visit Summary.  MyChart is used to connect with patients for Virtual Visits (Telemedicine).  Patients are able to view lab/test results, encounter notes, upcoming appointments, etc.  Non-urgent messages can be sent to your provider as well.   ?To learn more about what you can do with MyChart, go to [email or web address].   ? ?Your next appointment:   ?As needed ? ?The format for your next appointment:   ?In Person ? ?Provider:   ?Illene Labrador, III, MD  ? ? ?Other Instructions ? ? ? ?Your cardiac CT will be scheduled at one of the below locations:  ? ?[facility] ?[address] ?[place], [place] [address] ?[phone] ? ?OR ? ?[place] ?[address] ?[address] ?[place], [place] [address] ?[phone] ? ?If scheduled at [facility], please arrive at the [facility] and Children's Entrance (Entrance C2) of [facility] 30 minutes prior to test start time. ?You can use the FREE valet parking offered at entrance C (encouraged to control the heart rate for the test)  ?Proceed to the [facility] Radiology Department (first floor) to check-in and test prep. ? ?All radiology patients and guests should use entrance C2 at [facility], accessed from [facility], even though the hospital's physical address listed is [address] ? ? ? ?If scheduled at [facility], please arrive 15 mins early for check-in and test prep. ? ?Please follow these instructions carefully (unless otherwise directed): ? ?Hold all erectile dysfunction medications at least 3 days (72 hrs) prior to test. ? ?On the Night Before the Test: ?Be sure to Drink plenty of water. ?Do not consume any caffeinated/decaffeinated beverages or chocolate 12 hours prior to your test. ?Do not take any antihistamines 12 hours prior to your test. ? ?On the Day of the Test: ?Drink plenty of water until 1 hour prior to the test. ?Do not eat any food 4 hours prior to the test. ?You may take your regular medications prior to the test.  ?Take metoprolol (Lopressor) two hours prior to test. ?HOLD Furosemide/Hydrochlorothiazide morning of the test ? ?     ?After the Test: ?Drink plenty of water. ?After receiving IV contrast, you  may experience a mild flushed feeling. This is normal. ?On occasion, you may experience a mild rash up to 24 hours after the test. This is not dangerous. If this occurs, you can take Benadryl 25 mg and increase your fluid intake. ?If you experience trouble breathing, this can be serious. If it is severe call 911 IMMEDIATELY. If it is mild, please call our office. ?If you take any of these  medications: Glipizide/Metformin, Avandament, Glucavance, please do not take 48 hours after completing test unless otherwise instructed. ? ?We will call to schedule your test 2-4 weeks out understanding that some insurance companies will need an authorization prior to the service being performed.  ? ?For non-scheduling related questions, please contact the cardiac imaging nurse navigator should you have any questions/concerns: ?Marchia Bond, Cardiac Imaging Nurse Navigator ?Gordy Clement, Cardiac Imaging Nurse Navigator ?[facility] ?Direct Office Dial: [phone]  ? ?For scheduling needs, including cancellations and rescheduling, please call [place], [phone].   ?

## 2021-04-18 ENCOUNTER — Encounter: Payer: Self-pay | Admitting: Interventional Cardiology

## 2021-04-18 NOTE — Telephone Encounter (Signed)
Wants to be seen at [facility] office, as the insurance says it is in network.  ? ? ?Please send ASAP.  ?

## 2021-04-18 NOTE — Telephone Encounter (Signed)
Ok this is done 

## 2021-04-23 ENCOUNTER — Ambulatory Visit: Payer: No Typology Code available for payment source | Admitting: Skilled Nursing Facility1

## 2021-04-25 ENCOUNTER — Telehealth (HOSPITAL_COMMUNITY): Payer: Self-pay | Admitting: Emergency Medicine

## 2021-04-25 NOTE — Telephone Encounter (Signed)
Reaching out to patient to offer assistance regarding upcoming cardiac imaging study; pt verbalizes understanding of appt date/time, parking situation and where to check in, pre-test NPO status and medications ordered, and verified current allergies; name and call back number provided for further questions should they arise ?Marchia Bond RN Navigator Cardiac Imaging ?[facility] ?[phone] office ?[phone] cell ? ?'100mg'$  metoprolol tartrate ?Denies iv isuses ?Arrival 930 ? ?

## 2021-04-27 ENCOUNTER — Other Ambulatory Visit: Payer: Self-pay

## 2021-04-27 ENCOUNTER — Ambulatory Visit (HOSPITAL_COMMUNITY)
Admission: RE | Admit: 2021-04-27 | Discharge: 2021-04-27 | Disposition: A | Discharge status: Home / Self Care | Payer: No Typology Code available for payment source | Source: Ambulatory Visit | Attending: Interventional Cardiology | Admitting: Interventional Cardiology

## 2021-04-27 DIAGNOSIS — R072 Precordial pain: Secondary | ICD-10-CM | POA: Diagnosis not present

## 2021-04-27 MED ORDER — NITROGLYCERIN 0.4 MG SL SUBL
0.8000 mg | SUBLINGUAL_TABLET | Freq: Once | SUBLINGUAL | Status: AC
  Administered 2021-04-27: 0.8 mg via SUBLINGUAL

## 2021-04-27 MED ORDER — IOHEXOL 350 MG/ML SOLN
95.0000 mL | Freq: Once | INTRAVENOUS | Status: AC | PRN
  Administered 2021-04-27: 95 mL via INTRAVENOUS

## 2021-04-27 MED ORDER — NITROGLYCERIN 0.4 MG SL SUBL
SUBLINGUAL_TABLET | SUBLINGUAL | Status: AC
  Filled 2021-04-27: qty 2

## 2021-05-08 ENCOUNTER — Telehealth: Payer: Self-pay

## 2021-05-08 NOTE — Telephone Encounter (Signed)
I advised pt of Dr. Jenny Reichmann recommendation. Pt understood and had no further questions. ? ?FYI

## 2021-05-08 NOTE — Telephone Encounter (Signed)
Sorry, I dont believe this is an FDA approved med, so I really dont have an opinion.  He would take at his own risk,   thanks ?

## 2021-05-08 NOTE — Telephone Encounter (Signed)
Pt went to cardiologist on 3/24 and was advised that he has non-acholics fatty liver disease and purchase Silymarin(Milkthistle). The directions state  take 250 mg TID.  Pt is wanting to know if its ok to take. ? ?Please advise [phone] ?

## 2021-05-15 ENCOUNTER — Ambulatory Visit (INDEPENDENT_AMBULATORY_CARE_PROVIDER_SITE_OTHER): Payer: No Typology Code available for payment source | Admitting: Internal Medicine

## 2021-05-15 ENCOUNTER — Encounter: Payer: Self-pay | Admitting: Internal Medicine

## 2021-05-15 VITALS — BP 122/70 | HR 86 | Temp 98.7°F | Ht 73.0 in | Wt 234.0 lb

## 2021-05-15 DIAGNOSIS — R5383 Other fatigue: Secondary | ICD-10-CM

## 2021-05-15 DIAGNOSIS — E7800 Pure hypercholesterolemia, unspecified: Secondary | ICD-10-CM | POA: Diagnosis not present

## 2021-05-15 DIAGNOSIS — E559 Vitamin D deficiency, unspecified: Secondary | ICD-10-CM | POA: Diagnosis not present

## 2021-05-15 DIAGNOSIS — G4710 Hypersomnia, unspecified: Secondary | ICD-10-CM | POA: Diagnosis not present

## 2021-05-15 DIAGNOSIS — I10 Essential (primary) hypertension: Secondary | ICD-10-CM | POA: Diagnosis not present

## 2021-05-15 DIAGNOSIS — E1165 Type 2 diabetes mellitus with hyperglycemia: Secondary | ICD-10-CM

## 2021-05-15 LAB — TESTOSTERONE: Testosterone: 237.17 ng/dL — ABNORMAL LOW (ref 300.00–890.00)

## 2021-05-15 MED ORDER — EZETIMIBE 10 MG PO TABS
10.0000 mg | ORAL_TABLET | Freq: Every day | ORAL | 3 refills | Status: DC
Start: 2021-05-15 — End: 2021-11-13

## 2021-05-15 NOTE — Assessment & Plan Note (Signed)
Last vitamin D Lab Results  Component Value Date   VD25OH 49.33 04/02/2021   Stable, cont oral replacement  
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
## 2021-05-15 NOTE — Patient Instructions (Signed)
Please take all new medication as prescribed - the zetia for cholesterol ? ?Please continue all other medications as before, and refills have been done if requested. ? ?Please have the pharmacy call with any other refills you may need. ? ?Please continue your efforts at being more active, low cholesterol diet, and weight control. ? ?You are otherwise up to date with prevention measures today. ? ?Please keep your appointments with your specialists as you may have planned ? ?You will be contacted regarding the referral for: pulmonary (for possible sleep apnea), and Educations classes [facility]ner) ? ?Please go to the LAB at the blood drawing area for the tests to be done - only the testosterone level today ? ?You will be contacted by phone if any changes need to be made immediately.  Otherwise, you will receive a letter about your results with an explanation, but please check with MyChart first. ? ?Please remember to sign up for MyChart if you have not done so, as this will be important to you in the future with finding out test results, communicating by private email, and scheduling acute appointments online when needed. ? ?Please make an Appointment to return in 6 months, or sooner if needed (ok to cancel the August appt) ?

## 2021-05-20 DIAGNOSIS — R5383 Other fatigue: Secondary | ICD-10-CM | POA: Insufficient documentation

## 2021-05-20 NOTE — Assessment & Plan Note (Signed)
BP Readings from Last 3 Encounters:  ?05/15/21 122/70  ?04/27/21 (!) 126/104  ?04/17/21 (!) 142/102  ? ?Stable, pt to continue medical treatment norvasc, losartan, lopressor ? ?

## 2021-05-20 NOTE — Assessment & Plan Note (Signed)
?  Uncontrolled,,goal ldl < 70,  pt to start zetia 10 qd ? ?

## 2021-05-20 NOTE — Assessment & Plan Note (Signed)
With worsening symptoms - for pulm referral - r/o osa ?

## 2021-05-20 NOTE — Assessment & Plan Note (Signed)
Lab Results  ?Component Value Date  ? HGBA1C 7.0 (H) 04/02/2021  ? ?Stable, pt to continue current medical treatment metformin, also refer DM education ? ?

## 2021-05-20 NOTE — Assessment & Plan Note (Signed)
Also for testosterone check,  to f/u any worsening symptoms or concerns  

## 2021-05-23 ENCOUNTER — Ambulatory Visit (INDEPENDENT_AMBULATORY_CARE_PROVIDER_SITE_OTHER): Payer: No Typology Code available for payment source | Admitting: Endocrinology

## 2021-05-23 ENCOUNTER — Encounter: Payer: Self-pay | Admitting: Endocrinology

## 2021-05-23 VITALS — BP 134/90 | HR 71 | Ht 73.0 in | Wt 232.8 lb

## 2021-05-23 DIAGNOSIS — E1165 Type 2 diabetes mellitus with hyperglycemia: Secondary | ICD-10-CM | POA: Diagnosis not present

## 2021-05-23 DIAGNOSIS — E0590 Thyrotoxicosis, unspecified without thyrotoxic crisis or storm: Secondary | ICD-10-CM | POA: Diagnosis not present

## 2021-05-23 LAB — POCT GLYCOSYLATED HEMOGLOBIN (HGB A1C): Hemoglobin A1C: 7 % — AB (ref 4.0–5.6)

## 2021-05-23 LAB — TSH: TSH: 0.52 u[IU]/mL (ref 0.35–5.50)

## 2021-05-23 LAB — T4, FREE: Free T4: 0.89 ng/dL (ref 0.60–1.60)
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
## 2021-05-23 NOTE — Patient Instructions (Addendum)
Blood tests are requested for you today.  We'll let you know about the results.   ?If ever you have fever while taking methimazole, stop it and call us, even if the reason is obvious, because of the risk of a rare side-effect.  ?It is best to never miss the medication.  However, if you do miss it, next best is to double up the next time.   ?You should have an endocrinology follow-up appointment in 6 months.   ?

## 2021-06-02 ENCOUNTER — Other Ambulatory Visit: Payer: Self-pay | Admitting: Internal Medicine

## 2021-06-02 NOTE — Telephone Encounter (Signed)
Please refill as per office routine med refill policy (all routine meds to be refilled for 3 mo or monthly (per pt preference) up to one year from last visit, then month to month grace period for 3 mo, then further med refills will have to be denied) ? ?

## 2021-06-04 ENCOUNTER — Telehealth: Payer: Self-pay | Admitting: Internal Medicine

## 2021-06-04 MED ORDER — ONETOUCH VERIO W/DEVICE KIT
PACK | 0 refills | Status: DC
Start: 2021-06-04 — End: 2022-06-05

## 2021-06-04 MED ORDER — ONETOUCH VERIO VI STRP
ORAL_STRIP | 12 refills | Status: DC
Start: 2021-06-04 — End: 2022-06-06

## 2021-06-04 MED ORDER — LANCETS MISC: 3 refills | Status: DC

## 2021-06-04 NOTE — Telephone Encounter (Signed)
Patient would like a rx put in for the one touch meter.  Please send to [facility] on [address] in [place]. ?

## 2021-06-04 NOTE — Telephone Encounter (Signed)
Ok done erx 

## 2021-06-12 ENCOUNTER — Encounter: Payer: Self-pay | Admitting: Adult Health

## 2021-06-12 ENCOUNTER — Ambulatory Visit (INDEPENDENT_AMBULATORY_CARE_PROVIDER_SITE_OTHER): Payer: No Typology Code available for payment source | Admitting: Adult Health

## 2021-06-12 DIAGNOSIS — E669 Obesity, unspecified: Secondary | ICD-10-CM

## 2021-06-12 DIAGNOSIS — E66811 Obesity, class 1: Secondary | ICD-10-CM | POA: Insufficient documentation

## 2021-06-12 DIAGNOSIS — G4700 Insomnia, unspecified: Secondary | ICD-10-CM

## 2021-06-12 DIAGNOSIS — Z87891 Personal history of nicotine dependence: Secondary | ICD-10-CM

## 2021-06-12 DIAGNOSIS — R0683 Snoring: Secondary | ICD-10-CM | POA: Diagnosis not present

## 2021-06-12 DIAGNOSIS — J449 Chronic obstructive pulmonary disease, unspecified: Secondary | ICD-10-CM | POA: Diagnosis not present

## 2021-06-12 NOTE — Assessment & Plan Note (Signed)
Former smoker with heavy smoking history referred to the lung cancer screening program ?

## 2021-06-12 NOTE — Assessment & Plan Note (Signed)
Snoring, daytime sleepiness, restless sleep, BMI 31 all suspicious for underlying sleep apnea.  Patient education on sleep apnea.  Set patient up for home sleep study ?- discussed how weight can impact sleep and risk for sleep disordered breathing ?- discussed options to assist with weight loss: combination of diet modification, cardiovascular and strength training exercises ?  ?- had an extensive discussion regarding the adverse health consequences related to untreated sleep disordered breathing ?- specifically discussed the risks for hypertension, coronary artery disease, cardiac dysrhythmias, cerebrovascular disease, and diabetes ?- lifestyle modification discussed ?  ?- discussed how sleep disruption can increase risk of accidents, particularly when driving ?- safe driving practices were discussed ?  ?Plan  ?Patient Instructions  ?Set home sleep study  ?Healthy sleep regimen  ?Work on healthy weight loss  ?Do not drive if sleepy .  ?Melatonin At bedtime As needed  for insomnia .  ? ?Refer for Lung cancer CT screening program.  ? ?Follow up in 2 months to discuss results and treatment plan .  ? ? ?  ? ?

## 2021-06-12 NOTE — Assessment & Plan Note (Signed)
History of COPD.  Former patient of Dr. Melvyn Novas.  Patient is continue follow-up with primary care and current regimen. ?Heavy smoking history referral to the lung cancer screening program. ? ?

## 2021-06-12 NOTE — Patient Instructions (Addendum)
Set home sleep study  ?Healthy sleep regimen  ?Work on healthy weight loss  ?Do not drive if sleepy .  ?Melatonin At bedtime As needed  for insomnia .  ? ?Refer for Lung cancer CT screening program.  ? ?Follow up in 2 months to discuss results and treatment plan .  ? ? ?

## 2021-06-12 NOTE — Addendum Note (Signed)
Addended by: Vanessa Barbara on: 06/12/2021 02:51 PM ? ? Modules accepted: Orders ? ?

## 2021-06-12 NOTE — Assessment & Plan Note (Signed)
Healthy weight loss discussed 
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
## 2021-06-12 NOTE — Assessment & Plan Note (Signed)
Healthy sleep regimen  ?Melatonin As needed   ? ?Plan  ?Patient Instructions  ?Set home sleep study  ?Healthy sleep regimen  ?Work on healthy weight loss  ?Do not drive if sleepy .  ?Melatonin At bedtime As needed  for insomnia .  ? ?Refer for Lung cancer CT screening program.  ? ?Follow up in 2 months to discuss results and treatment plan .  ? ? ?  ? ?

## 2021-06-13 NOTE — Progress Notes (Signed)
Reviewed and agree with assessment/plan. ? ? ?Chesley Mires, MD ?[place] ?06/13/2021, 8:24 AM ?Pager:  [phone] ? ?

## 2021-07-03 ENCOUNTER — Ambulatory Visit: Payer: 59 | Admitting: Internal Medicine

## 2021-07-26 ENCOUNTER — Other Ambulatory Visit: Payer: Self-pay

## 2021-07-26 DIAGNOSIS — E0590 Thyrotoxicosis, unspecified without thyrotoxic crisis or storm: Secondary | ICD-10-CM

## 2021-07-26 MED ORDER — METHIMAZOLE 5 MG PO TABS: 5.0000 mg | ORAL_TABLET | Freq: Every day | ORAL | 3 refills | Status: DC

## 2021-08-14 ENCOUNTER — Ambulatory Visit (INDEPENDENT_AMBULATORY_CARE_PROVIDER_SITE_OTHER): Payer: No Typology Code available for payment source | Admitting: Emergency Medicine

## 2021-08-14 ENCOUNTER — Encounter: Payer: Self-pay | Admitting: Emergency Medicine

## 2021-08-14 ENCOUNTER — Telehealth: Payer: Self-pay

## 2021-08-14 VITALS — BP 140/72 | HR 83 | Temp 98.5°F | Ht 72.0 in | Wt 231.0 lb

## 2021-08-14 DIAGNOSIS — J449 Chronic obstructive pulmonary disease, unspecified: Secondary | ICD-10-CM | POA: Diagnosis not present

## 2021-08-14 DIAGNOSIS — Z87891 Personal history of nicotine dependence: Secondary | ICD-10-CM

## 2021-08-14 DIAGNOSIS — Z720 Tobacco use: Secondary | ICD-10-CM

## 2021-08-14 DIAGNOSIS — G4710 Hypersomnia, unspecified: Secondary | ICD-10-CM

## 2021-08-14 MED ORDER — BREZTRI AEROSPHERE 160-9-4.8 MCG/ACT IN AERO: 2.0000 | INHALATION_SPRAY | Freq: Two times a day (BID) | RESPIRATORY_TRACT | 0 refills | Status: DC
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
## 2021-08-14 NOTE — Telephone Encounter (Signed)
Pt is requesting update on HST.Please advise.

## 2021-08-14 NOTE — Patient Instructions (Addendum)
We will try starting a new medication called Breztri.  Take 2 puffs twice a day on a schedule.  Rinse and gargle after using.  Keep track of whether you benefit from this medication.  If so then we will order it through your pharmacy and see if it is covered by your insurance. Keep your albuterol available to use 2 puffs to be needed for shortness of breath, chest tightness, wheezing. We will work on getting your home sleep study arranged We will refer you to the lung cancer screening program. Follow Dr. Lamonte Sakai or with T. Parrett in 2 months

## 2021-08-24 ENCOUNTER — Ambulatory Visit: Payer: No Typology Code available for payment source

## 2021-08-24 DIAGNOSIS — G4700 Insomnia, unspecified: Secondary | ICD-10-CM

## 2021-08-24 DIAGNOSIS — E669 Obesity, unspecified: Secondary | ICD-10-CM

## 2021-08-24 DIAGNOSIS — R0683 Snoring: Secondary | ICD-10-CM

## 2021-08-24 DIAGNOSIS — G4733 Obstructive sleep apnea (adult) (pediatric): Secondary | ICD-10-CM

## 2021-08-29 NOTE — Assessment & Plan Note (Signed)
We will try starting a new medication called Breztri.  Take 2 puffs twice a day on a schedule.  Rinse and gargle after using.  Keep track of whether you benefit from this medication.  If so then we will order it through your pharmacy and see if it is covered by your insurance. Keep your albuterol available to use 2 puffs to be needed for shortness of breath, chest tightness, wheezing. Follow Dr. Lamonte Sakai or with T. Parrett in 2 months

## 2021-08-29 NOTE — Assessment & Plan Note (Signed)
We will refer you to the lung cancer screening program.

## 2021-08-29 NOTE — Assessment & Plan Note (Signed)
We will work on getting your home sleep study arranged

## 2021-08-31 DIAGNOSIS — G4733 Obstructive sleep apnea (adult) (pediatric): Secondary | ICD-10-CM | POA: Diagnosis not present

## 2021-09-03 ENCOUNTER — Telehealth: Payer: Self-pay

## 2021-09-03 DIAGNOSIS — I10 Essential (primary) hypertension: Secondary | ICD-10-CM

## 2021-09-03 MED ORDER — METFORMIN HCL ER 500 MG PO TB24: ORAL_TABLET | ORAL | 1 refills | Status: DC

## 2021-09-03 MED ORDER — AMLODIPINE BESYLATE 5 MG PO TABS: 5.0000 mg | ORAL_TABLET | Freq: Every day | ORAL | 1 refills | Status: DC

## 2021-09-03 MED ORDER — LOSARTAN POTASSIUM 100 MG PO TABS: 100.0000 mg | ORAL_TABLET | Freq: Every day | ORAL | 1 refills | Status: DC

## 2021-09-03 NOTE — Telephone Encounter (Signed)
Refills sent to pharmacy. 

## 2021-09-03 NOTE — Telephone Encounter (Signed)
Pt is calling requesting a refill on: metFORMIN (GLUCOPHAGE-XR) 500 MG 24 hr tablet amLODipine (NORVASC) 5 MG tablet losartan (COZAAR) 100 MG tablet  Pharmacy: [id number] - [place], [place] AT [facility] OF [address]  LOV 05/15/21 ROV 11/13/21

## 2021-09-21 ENCOUNTER — Ambulatory Visit (INDEPENDENT_AMBULATORY_CARE_PROVIDER_SITE_OTHER): Payer: No Typology Code available for payment source | Admitting: Internal Medicine

## 2021-09-21 VITALS — BP 122/66 | HR 68 | Temp 98.6°F | Ht 72.0 in | Wt 232.0 lb

## 2021-09-21 DIAGNOSIS — J449 Chronic obstructive pulmonary disease, unspecified: Secondary | ICD-10-CM | POA: Diagnosis not present

## 2021-09-21 DIAGNOSIS — E1165 Type 2 diabetes mellitus with hyperglycemia: Secondary | ICD-10-CM | POA: Diagnosis not present

## 2021-09-21 DIAGNOSIS — J309 Allergic rhinitis, unspecified: Secondary | ICD-10-CM | POA: Diagnosis not present

## 2021-09-21 DIAGNOSIS — R42 Dizziness and giddiness: Secondary | ICD-10-CM | POA: Diagnosis not present

## 2021-09-21 DIAGNOSIS — T7840XD Allergy, unspecified, subsequent encounter: Secondary | ICD-10-CM

## 2021-09-21 MED ORDER — GUAIFENESIN ER 600 MG PO TB12: 1200.0000 mg | ORAL_TABLET | Freq: Two times a day (BID) | ORAL | 2 refills | Status: AC | PRN

## 2021-09-21 MED ORDER — TRIAMCINOLONE ACETONIDE 55 MCG/ACT NA AERO: 2.0000 | INHALATION_SPRAY | Freq: Every day | NASAL | 12 refills | Status: AC

## 2021-09-21 MED ORDER — METHYLPREDNISOLONE ACETATE 80 MG/ML IJ SUSP
80.0000 mg | Freq: Once | INTRAMUSCULAR | Status: AC
  Administered 2021-09-21: 80 mg via INTRAMUSCULAR

## 2021-09-21 MED ORDER — CETIRIZINE HCL 10 MG PO TABS: 10.0000 mg | ORAL_TABLET | Freq: Every day | ORAL | 11 refills | Status: DC

## 2021-09-21 MED ORDER — PREDNISONE 10 MG PO TABS: ORAL_TABLET | ORAL | 0 refills | Status: DC

## 2021-09-21 MED ORDER — MECLIZINE HCL 12.5 MG PO TABS: 12.5000 mg | ORAL_TABLET | Freq: Three times a day (TID) | ORAL | 1 refills | Status: AC | PRN
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
## 2021-09-21 NOTE — Patient Instructions (Signed)
You had the steroid shot today  Please take all new medication as prescribed - the prednisone for the allergies, and the meclizine for the vertigo  You can also take Zyrtec and nasacort for allergies, and Mucinex (or it's generic off brand) for congestion, and tylenol as needed for pain.  Please continue all other medications as before, and refills have been done if requested.  Please have the pharmacy call with any other refills you may need.  Please keep your appointments with your specialists as you may have planned

## 2021-09-22 ENCOUNTER — Encounter: Payer: Self-pay | Admitting: Internal Medicine

## 2021-09-22 DIAGNOSIS — R42 Dizziness and giddiness: Secondary | ICD-10-CM | POA: Insufficient documentation

## 2021-09-22 NOTE — Assessment & Plan Note (Signed)
Stable, cont inhaler prn 

## 2021-09-22 NOTE — Assessment & Plan Note (Signed)
Lab Results  Component Value Date   HGBA1C 7.0 (A) 05/23/2021   Stable, pt to continue current medical treatment meformin ER 500 mg - 4 tab in qam

## 2021-09-22 NOTE — Assessment & Plan Note (Signed)
Mild to mod seasonal flare, for depomedrol I'm 80 mg, prednisone taper,  to f/u any worsening symptoms or concerns

## 2021-09-22 NOTE — Assessment & Plan Note (Signed)
Mid intermittent, c/w peripheral, for meclizine prn,  to f/u any worsening symptoms or concerns

## 2021-09-24 ENCOUNTER — Ambulatory Visit (INDEPENDENT_AMBULATORY_CARE_PROVIDER_SITE_OTHER): Payer: No Typology Code available for payment source | Admitting: Sports Medicine

## 2021-09-24 ENCOUNTER — Ambulatory Visit (INDEPENDENT_AMBULATORY_CARE_PROVIDER_SITE_OTHER): Payer: No Typology Code available for payment source

## 2021-09-24 VITALS — BP 122/66 | HR 76 | Ht 72.0 in | Wt 232.0 lb

## 2021-09-24 DIAGNOSIS — M170 Bilateral primary osteoarthritis of knee: Secondary | ICD-10-CM

## 2021-09-24 DIAGNOSIS — M25561 Pain in right knee: Secondary | ICD-10-CM | POA: Diagnosis not present

## 2021-09-24 DIAGNOSIS — M25562 Pain in left knee: Secondary | ICD-10-CM | POA: Diagnosis not present

## 2021-09-24 DIAGNOSIS — M1711 Unilateral primary osteoarthritis, right knee: Secondary | ICD-10-CM | POA: Diagnosis not present

## 2021-09-24 DIAGNOSIS — G8929 Other chronic pain: Secondary | ICD-10-CM | POA: Diagnosis not present

## 2021-09-24 DIAGNOSIS — M1712 Unilateral primary osteoarthritis, left knee: Secondary | ICD-10-CM | POA: Diagnosis not present

## 2021-09-24 NOTE — Patient Instructions (Addendum)
Good to see you Knee HEP 3-4 week follow up   
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
## 2021-10-17 ENCOUNTER — Ambulatory Visit (INDEPENDENT_AMBULATORY_CARE_PROVIDER_SITE_OTHER): Payer: No Typology Code available for payment source | Admitting: Emergency Medicine

## 2021-10-17 ENCOUNTER — Encounter: Payer: Self-pay | Admitting: Emergency Medicine

## 2021-10-17 VITALS — BP 128/74 | HR 74 | Temp 98.3°F | Ht 72.0 in | Wt 229.0 lb

## 2021-10-17 DIAGNOSIS — Z23 Encounter for immunization: Secondary | ICD-10-CM | POA: Diagnosis not present

## 2021-10-17 DIAGNOSIS — G4733 Obstructive sleep apnea (adult) (pediatric): Secondary | ICD-10-CM

## 2021-10-17 DIAGNOSIS — J449 Chronic obstructive pulmonary disease, unspecified: Secondary | ICD-10-CM | POA: Diagnosis not present
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
## 2021-10-17 NOTE — Patient Instructions (Signed)
We will arrange for CPAP machine for you to use at night while sleeping.  We will obtain a full facemask. Continue to use your albuterol 2 puffs up to every 4 hours if needed for shortness of breath, chest tightness, wheezing. Follow with either APP or Dr. Lamonte Sakai in 2 months to review your CPAP usage.

## 2021-10-17 NOTE — Assessment & Plan Note (Signed)
Confirmed on his recent sleep study.  We will start CPAP 5-20 cmH2O AutoSet with a full facemask.  Follow-up in about 60 days to confirm compliance, assess for clinical benefit

## 2021-10-17 NOTE — Assessment & Plan Note (Signed)
Difficulty obtaining his medications due to cost.  He did feel that he benefited in the past from Symbicort.  He did not like the breztri, felt that it increased his congestion.  We could consider try to get financial assistance for the Symbicort at some point going forward.  For now he will continue his albuterol as needed.

## 2021-10-30 ENCOUNTER — Telehealth: Payer: Self-pay | Admitting: Emergency Medicine

## 2021-10-30 DIAGNOSIS — E1169 Type 2 diabetes mellitus with other specified complication: Secondary | ICD-10-CM | POA: Diagnosis not present

## 2021-10-30 DIAGNOSIS — R2681 Unsteadiness on feet: Secondary | ICD-10-CM | POA: Diagnosis not present

## 2021-10-30 DIAGNOSIS — Z6830 Body mass index (BMI) 30.0-30.9, adult: Secondary | ICD-10-CM | POA: Diagnosis not present

## 2021-10-30 DIAGNOSIS — F17211 Nicotine dependence, cigarettes, in remission: Secondary | ICD-10-CM | POA: Diagnosis not present

## 2021-10-30 DIAGNOSIS — I700 Atherosclerosis of aorta: Secondary | ICD-10-CM | POA: Diagnosis not present

## 2021-10-30 DIAGNOSIS — I771 Stricture of artery: Secondary | ICD-10-CM | POA: Diagnosis not present

## 2021-10-30 DIAGNOSIS — E785 Hyperlipidemia, unspecified: Secondary | ICD-10-CM | POA: Diagnosis not present

## 2021-10-30 DIAGNOSIS — Z008 Encounter for other general examination: Secondary | ICD-10-CM | POA: Diagnosis not present

## 2021-10-30 DIAGNOSIS — J449 Chronic obstructive pulmonary disease, unspecified: Secondary | ICD-10-CM | POA: Diagnosis not present

## 2021-10-30 DIAGNOSIS — K219 Gastro-esophageal reflux disease without esophagitis: Secondary | ICD-10-CM | POA: Diagnosis not present

## 2021-10-30 DIAGNOSIS — E669 Obesity, unspecified: Secondary | ICD-10-CM | POA: Diagnosis not present

## 2021-10-30 DIAGNOSIS — I10 Essential (primary) hypertension: Secondary | ICD-10-CM | POA: Diagnosis not present

## 2021-10-30 NOTE — Telephone Encounter (Signed)
Received request for medical records from [place] to support request for SSA disability.  Faxed ov notes from 06/12/21, 08/14/21, 10/17/21 and sleep study results from 08/31/21 to fax# [phone].

## 2021-11-09 ENCOUNTER — Telehealth: Payer: Self-pay | Admitting: Internal Medicine

## 2021-11-09 DIAGNOSIS — G4733 Obstructive sleep apnea (adult) (pediatric): Secondary | ICD-10-CM | POA: Diagnosis not present

## 2021-11-09 NOTE — Telephone Encounter (Signed)
Patient needs his Zetia 10 mg generic sent to [facility] on [address]

## 2021-11-12 NOTE — Telephone Encounter (Signed)
Patient called back and needs his Zetia called in.

## 2021-11-13 ENCOUNTER — Ambulatory Visit (INDEPENDENT_AMBULATORY_CARE_PROVIDER_SITE_OTHER): Payer: No Typology Code available for payment source | Admitting: Internal Medicine

## 2021-11-13 ENCOUNTER — Encounter: Payer: Self-pay | Admitting: Internal Medicine

## 2021-11-13 ENCOUNTER — Other Ambulatory Visit: Payer: Self-pay | Admitting: Internal Medicine

## 2021-11-13 VITALS — BP 120/72 | HR 63 | Temp 98.6°F | Ht 72.0 in | Wt 228.0 lb

## 2021-11-13 DIAGNOSIS — I10 Essential (primary) hypertension: Secondary | ICD-10-CM | POA: Diagnosis not present

## 2021-11-13 DIAGNOSIS — E1165 Type 2 diabetes mellitus with hyperglycemia: Secondary | ICD-10-CM

## 2021-11-13 DIAGNOSIS — E7800 Pure hypercholesterolemia, unspecified: Secondary | ICD-10-CM

## 2021-11-13 DIAGNOSIS — K5900 Constipation, unspecified: Secondary | ICD-10-CM

## 2021-11-13 LAB — HEPATIC FUNCTION PANEL
ALT: 26 U/L (ref 0–53)
AST: 20 U/L (ref 0–37)
Albumin: 4.5 g/dL (ref 3.5–5.2)
Alkaline Phosphatase: 40 U/L (ref 39–117)
Bilirubin, Direct: 0.1 mg/dL (ref 0.0–0.3)
Total Bilirubin: 0.5 mg/dL (ref 0.2–1.2)
Total Protein: 7.9 g/dL (ref 6.0–8.3)

## 2021-11-13 LAB — BASIC METABOLIC PANEL
BUN: 13 mg/dL (ref 6–23)
CO2: 27 mEq/L (ref 19–32)
Calcium: 9.9 mg/dL (ref 8.4–10.5)
Chloride: 102 mEq/L (ref 96–112)
Creatinine, Ser: 0.89 mg/dL (ref 0.40–1.50)
GFR: 89.75 mL/min (ref >60.00)
Glucose, Bld: 146 mg/dL — ABNORMAL HIGH (ref 70–99)
Potassium: 4.2 mEq/L (ref 3.5–5.1)
Sodium: 137 mEq/L (ref 135–145)

## 2021-11-13 LAB — LIPID PANEL
Cholesterol: 109 mg/dL (ref 0–200)
HDL: 42.8 mg/dL (ref >39.00)
LDL Cholesterol: 53 mg/dL (ref 0–99)
NonHDL: 66.1
Total CHOL/HDL Ratio: 3
Triglycerides: 65 mg/dL (ref 0.0–149.0)
VLDL: 13 mg/dL (ref 0.0–40.0)

## 2021-11-13 LAB — HEMOGLOBIN A1C: Hgb A1c MFr Bld: 8.4 % — ABNORMAL HIGH (ref 4.6–6.5)

## 2021-11-13 MED ORDER — EZETIMIBE 10 MG PO TABS: 10.0000 mg | ORAL_TABLET | Freq: Every day | ORAL | 3 refills | Status: DC

## 2021-11-13 MED ORDER — GLIPIZIDE ER 2.5 MG PO TB24: 2.5000 mg | ORAL_TABLET | Freq: Every day | ORAL | 3 refills | Status: DC

## 2021-11-13 NOTE — Assessment & Plan Note (Signed)
Lab Results  Component Value Date   HGBA1C 7.0 (A) 05/23/2021   Stable, pt to continue current medical treatment metformin ER 500 mg - 4 qd

## 2021-11-13 NOTE — Patient Instructions (Signed)
Please have your Shingrix (shingles) shots done at your local pharmacy.  Please make sure to get the CPAP machine fixed  Ok to take the OTC Colace 100 mg up to twice per day to help avoid constipation  Please continue all other medications as before, and refills have been done if requested.  Please have the pharmacy call with any other refills you may need.  Please continue your efforts at being more active, low cholesterol diet, and weight control.  You are otherwise up to date with prevention measures today.  Please keep your appointments with your specialists as you may have planned  You will be contacted regarding the referral for: Eye doctor  Please go to the LAB at the blood drawing area for the tests to be done  You will be contacted by phone if any changes need to be made immediately.  Otherwise, you will receive a letter about your results with an explanation, but please check with MyChart first.  Please remember to sign up for MyChart if you have not done so, as this will be important to you in the future with finding out test results, communicating by private email, and scheduling acute appointments online when needed.  Please make an Appointment to return in 6 months, or sooner if needed

## 2021-11-13 NOTE — Assessment & Plan Note (Signed)
BP Readings from Last 3 Encounters:  11/13/21 120/72  10/17/21 128/74  09/24/21 122/66   Stable, pt to continue medical treatment losartan 100 mg qd, norvasc 5 mg qd

## 2021-11-13 NOTE — Assessment & Plan Note (Signed)
Lab Results  Component Value Date   LDLCALC 69 01/18/2020   Stable, pt to continue current statin crestor 40 mg qd

## 2021-11-13 NOTE — Telephone Encounter (Signed)
Done erx at time or visit.

## 2021-11-13 NOTE — Assessment & Plan Note (Signed)
Mild worsening recently, for colace 100 mg bid
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
## 2021-11-26 ENCOUNTER — Encounter: Payer: Self-pay | Admitting: Internal Medicine

## 2021-11-26 ENCOUNTER — Ambulatory Visit: Payer: No Typology Code available for payment source | Admitting: Internal Medicine

## 2021-11-26 VITALS — BP 126/82 | HR 68 | Ht 72.0 in | Wt 233.0 lb

## 2021-11-26 DIAGNOSIS — E1165 Type 2 diabetes mellitus with hyperglycemia: Secondary | ICD-10-CM | POA: Diagnosis not present

## 2021-11-26 DIAGNOSIS — E0590 Thyrotoxicosis, unspecified without thyrotoxic crisis or storm: Secondary | ICD-10-CM

## 2021-11-26 LAB — T4, FREE: Free T4: 0.9 ng/dL (ref 0.60–1.60)

## 2021-11-26 LAB — TSH: TSH: 1.62 u[IU]/mL (ref 0.35–5.50)

## 2021-11-26 LAB — POCT GLUCOSE (DEVICE FOR HOME USE): POC Glucose: 155 mg/dl — AB (ref 70–99)

## 2021-11-26 MED ORDER — GLIPIZIDE ER 5 MG PO TB24: 5.0000 mg | ORAL_TABLET | Freq: Every day | ORAL | 3 refills | Status: DC

## 2021-11-26 MED ORDER — METHIMAZOLE 5 MG PO TABS: 5.0000 mg | ORAL_TABLET | Freq: Every day | ORAL | 3 refills | Status: DC

## 2021-11-26 MED ORDER — METFORMIN HCL ER 500 MG PO TB24: ORAL_TABLET | ORAL | 3 refills | Status: DC
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
## 2021-11-26 NOTE — Patient Instructions (Signed)
Increase Glipizide 5 mg XL, 1 tablet before Breakfast  Continue Metformin 500 mg 4 tablets daily before Breakfast      HOW TO TREAT LOW BLOOD SUGARS (Blood sugar LESS THAN 70 MG/DL) Please follow the RULE OF 15 for the treatment of hypoglycemia treatment (when your (blood sugars are less than 70 mg/dL)   STEP 1: Take 15 grams of carbohydrates when your blood sugar is low, which includes:  3-4 GLUCOSE TABS  OR 3-4 OZ OF JUICE OR REGULAR SODA OR ONE TUBE OF GLUCOSE GEL    STEP 2: RECHECK blood sugar in 15 MINUTES STEP 3: If your blood sugar is still low at the 15 minute recheck --> then, go back to STEP 1 and treat AGAIN with another 15 grams of carbohydrates.

## 2021-11-28 LAB — T3: T3, Total: 103 ng/dL (ref 76–181)

## 2021-11-28 LAB — TRAB (TSH RECEPTOR BINDING ANTIBODY): TRAB: <1 IU/L (ref <=2.00)

## 2021-12-08 ENCOUNTER — Telehealth: Payer: Self-pay | Admitting: Internal Medicine

## 2021-12-08 DIAGNOSIS — I10 Essential (primary) hypertension: Secondary | ICD-10-CM

## 2021-12-08 NOTE — Telephone Encounter (Signed)
Please refill as per office routine med refill policy (all routine meds to be refilled for 3 mo or monthly (per pt preference) up to one year from last visit, then month to month grace period for 3 mo, then further med refills will have to be denied) ? ?

## 2021-12-10 DIAGNOSIS — G4733 Obstructive sleep apnea (adult) (pediatric): Secondary | ICD-10-CM | POA: Diagnosis not present

## 2021-12-19 ENCOUNTER — Encounter: Payer: Self-pay | Admitting: Emergency Medicine

## 2021-12-19 ENCOUNTER — Ambulatory Visit: Payer: No Typology Code available for payment source | Admitting: Emergency Medicine

## 2021-12-19 VITALS — BP 116/80 | HR 76 | Temp 97.9°F | Ht 72.0 in | Wt 235.2 lb

## 2021-12-19 DIAGNOSIS — J449 Chronic obstructive pulmonary disease, unspecified: Secondary | ICD-10-CM | POA: Diagnosis not present

## 2021-12-19 DIAGNOSIS — G4733 Obstructive sleep apnea (adult) (pediatric): Secondary | ICD-10-CM | POA: Diagnosis not present

## 2021-12-19 NOTE — Assessment & Plan Note (Signed)
Good compliance and good clinical benefit documented today.  Plan to continue CPAP 5-20 cmH2O AutoSet with a nasal mask.  He agrees with the plan.  Follow annually to confirm compliance and troubleshoot.

## 2021-12-19 NOTE — Patient Instructions (Signed)
Continue to use your CPAP every night as you have been doing.  We confirmed reliable usage and good clinical benefit today.  This will be documented in your chart. Keep your albuterol available to use 2 puffs if needed for shortness of breath, chest tightness, wheezing, spells of coughing. We will not start an every day scheduled inhaler right now but we can consider doing so going forward. Follow with Gabriel Coleman in 12 months or sooner if you have any problems.

## 2021-12-19 NOTE — Telephone Encounter (Signed)
Patient called to follow up on this. Last was 11/13/21 of OV

## 2021-12-19 NOTE — Assessment & Plan Note (Signed)
Intermittent symptoms.  Plan to continue off scheduled BD, albuterol as needed.  If insurance/cost changes then I would certainly reconsider getting him on maintenance therapy
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
## 2021-12-20 ENCOUNTER — Other Ambulatory Visit: Payer: Self-pay

## 2021-12-20 DIAGNOSIS — I10 Essential (primary) hypertension: Secondary | ICD-10-CM

## 2021-12-22 ENCOUNTER — Other Ambulatory Visit: Payer: Self-pay | Admitting: Internal Medicine

## 2021-12-22 DIAGNOSIS — I10 Essential (primary) hypertension: Secondary | ICD-10-CM

## 2021-12-23 NOTE — Telephone Encounter (Signed)
Please refill as per office routine med refill policy (all routine meds to be refilled for 3 mo or monthly (per pt preference) up to one year from last visit, then month to month grace period for 3 mo, then further med refills will have to be denied) ? ?

## 2021-12-26 ENCOUNTER — Ambulatory Visit (INDEPENDENT_AMBULATORY_CARE_PROVIDER_SITE_OTHER): Payer: No Typology Code available for payment source | Admitting: Internal Medicine

## 2021-12-26 ENCOUNTER — Encounter: Payer: Self-pay | Admitting: Internal Medicine

## 2021-12-26 VITALS — BP 138/86 | HR 65 | Ht 72.0 in | Wt 233.0 lb

## 2021-12-26 DIAGNOSIS — K5900 Constipation, unspecified: Secondary | ICD-10-CM | POA: Diagnosis not present

## 2021-12-26 DIAGNOSIS — I10 Essential (primary) hypertension: Secondary | ICD-10-CM

## 2021-12-26 DIAGNOSIS — E1165 Type 2 diabetes mellitus with hyperglycemia: Secondary | ICD-10-CM | POA: Diagnosis not present

## 2021-12-26 DIAGNOSIS — R351 Nocturia: Secondary | ICD-10-CM

## 2021-12-26 DIAGNOSIS — E559 Vitamin D deficiency, unspecified: Secondary | ICD-10-CM

## 2021-12-26 MED ORDER — SOLIFENACIN SUCCINATE 5 MG PO TABS: 5.0000 mg | ORAL_TABLET | Freq: Every day | ORAL | 3 refills | Status: AC

## 2021-12-26 MED ORDER — AMLODIPINE BESYLATE 5 MG PO TABS: 5.0000 mg | ORAL_TABLET | Freq: Every day | ORAL | 3 refills | Status: DC

## 2021-12-26 MED ORDER — LOSARTAN POTASSIUM 100 MG PO TABS: 100.0000 mg | ORAL_TABLET | Freq: Every day | ORAL | 3 refills | Status: DC

## 2021-12-26 NOTE — Patient Instructions (Addendum)
Please take all new medication as prescribed - the generic vesicare for bladder if not expensive  Please continue all other medications as before, and refills have been done if requested - the norvasc and losartan  Please have the pharmacy call with any other refills you may need.  Please continue your efforts at being more active, low cholesterol diet, and weight control.  Please keep your appointments with your specialists as you may have planned  Please make an Appointment to return in May 15, 2022 or sooner if needed

## 2021-12-26 NOTE — Assessment & Plan Note (Signed)
By hx, is suggestive of OAB - for start vesicare 5 qd if ok with insurance

## 2021-12-26 NOTE — Assessment & Plan Note (Signed)
Lab Results  Component Value Date   HGBA1C 8.4 (H) 11/13/2021   Uncontrolled but improving, pt to continue current medical treatment glucotrol 5 qd, metfomrin ER 500 - 4 qam,, f/u endo as planned
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
## 2021-12-26 NOTE — Assessment & Plan Note (Signed)
I suspect this as cause for am lower abd pains daily, for restart colace 100 bid and miralax 17 gm qd prn

## 2021-12-26 NOTE — Assessment & Plan Note (Signed)
BP Readings from Last 3 Encounters:  12/26/21 138/86  12/19/21 116/80  11/26/21 126/82   Mild uncontrolled, pt to restart medical treatment norvasc 5 qd, losartan 100 mg qd

## 2021-12-26 NOTE — Assessment & Plan Note (Signed)
Last vitamin D Lab Results  Component Value Date   VD25OH 49.33 04/02/2021   Stable, cont oral replacement

## 2022-01-09 DIAGNOSIS — G4733 Obstructive sleep apnea (adult) (pediatric): Secondary | ICD-10-CM | POA: Diagnosis not present

## 2022-01-30 DIAGNOSIS — E669 Obesity, unspecified: Secondary | ICD-10-CM | POA: Diagnosis not present

## 2022-01-30 DIAGNOSIS — E1169 Type 2 diabetes mellitus with other specified complication: Secondary | ICD-10-CM | POA: Diagnosis not present

## 2022-01-30 DIAGNOSIS — Z008 Encounter for other general examination: Secondary | ICD-10-CM | POA: Diagnosis not present

## 2022-01-30 DIAGNOSIS — E785 Hyperlipidemia, unspecified: Secondary | ICD-10-CM | POA: Diagnosis not present

## 2022-01-30 DIAGNOSIS — Z6831 Body mass index (BMI) 31.0-31.9, adult: Secondary | ICD-10-CM | POA: Diagnosis not present

## 2022-02-09 DIAGNOSIS — G4733 Obstructive sleep apnea (adult) (pediatric): Secondary | ICD-10-CM | POA: Diagnosis not present

## 2022-02-13 DIAGNOSIS — H40013 Open angle with borderline findings, low risk, bilateral: Secondary | ICD-10-CM | POA: Diagnosis not present

## 2022-02-13 DIAGNOSIS — H2513 Age-related nuclear cataract, bilateral: Secondary | ICD-10-CM | POA: Diagnosis not present

## 2022-02-13 DIAGNOSIS — E119 Type 2 diabetes mellitus without complications: Secondary | ICD-10-CM | POA: Diagnosis not present

## 2022-02-13 LAB — HM DIABETES EYE EXAM

## 2022-02-15 ENCOUNTER — Encounter: Payer: Self-pay | Admitting: Internal Medicine

## 2022-02-20 ENCOUNTER — Telehealth: Payer: Self-pay

## 2022-02-20 NOTE — Telephone Encounter (Signed)
Abstract  

## 2022-03-04 ENCOUNTER — Ambulatory Visit (INDEPENDENT_AMBULATORY_CARE_PROVIDER_SITE_OTHER): Payer: No Typology Code available for payment source

## 2022-03-04 VITALS — Ht 72.0 in | Wt 228.0 lb

## 2022-03-04 DIAGNOSIS — Z0000 Encounter for general adult medical examination without abnormal findings: Secondary | ICD-10-CM
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
## 2022-03-04 NOTE — Patient Instructions (Signed)
Gabriel Coleman , Thank you for taking time to come for your Medicare Wellness Visit. I appreciate your ongoing commitment to your health goals. Please review the following plan we discussed and let me know if I can assist you in the future.   These are the goals we discussed:  Goals      Exercise 3x per week (30 min per time)     Has gym membership that he has not started. Plan to go soon for regular exercise        This is a list of the screening recommended for you and due dates:  Health Maintenance  Topic Date Due   Zoster (Shingles) Vaccine (1 of 2) Never done   COVID-19 Vaccine (4 - 2023-24 season) 10/05/2021   Complete foot exam   11/16/2021   DTaP/Tdap/Td vaccine (2 - Td or Tdap) 11/28/2021   Yearly kidney health urinalysis for diabetes  04/02/2022   Screening for Lung Cancer  04/28/2022   Hemoglobin A1C  05/15/2022   Colon Cancer Screening  09/15/2022   Yearly kidney function blood test for diabetes  11/14/2022   Eye exam for diabetics  02/14/2023   Medicare Annual Wellness Visit  03/05/2023   Pneumonia Vaccine (3 - PPSV23 or PCV20) 01/03/2026   Flu Shot  Completed   Hepatitis C Screening: USPSTF Recommendation to screen - Ages 18-79 yo.  Completed   HPV Vaccine  Aged Out    Advanced directives: no, info placed in AVS  Conditions/risks identified: none  Next appointment: Follow up in one year for your annual wellness visit. 03/07/2023 '@1pm'$  telephone  Preventive Care 65 Years and Older, Male  Preventive care refers to lifestyle choices and visits with your health care provider that can promote health and wellness. What does preventive care include? A yearly physical exam. This is also called an annual well check. Dental exams once or twice a year. Routine eye exams. Ask your health care provider how often you should have your eyes checked. Personal lifestyle choices, including: Daily care of your teeth and gums. Regular physical activity. Eating a healthy  diet. Avoiding tobacco and drug use. Limiting alcohol use. Practicing safe sex. Taking low doses of aspirin every day. Taking vitamin and mineral supplements as recommended by your health care provider. What happens during an annual well check? The services and screenings done by your health care provider during your annual well check will depend on your age, overall health, lifestyle risk factors, and family history of disease. Counseling  Your health care provider may ask you questions about your: Alcohol use. Tobacco use. Drug use. Emotional well-being. Home and relationship well-being. Sexual activity. Eating habits. History of falls. Memory and ability to understand (cognition). Work and work Statistician. Screening  You may have the following tests or measurements: Height, weight, and BMI. Blood pressure. Lipid and cholesterol levels. These may be checked every 5 years, or more frequently if you are over 3 years old. Skin check. Lung cancer screening. You may have this screening every year starting at age 28 if you have a 30-pack-year history of smoking and currently smoke or have quit within the past 15 years. Fecal occult blood test (FOBT) of the stool. You may have this test every year starting at age 64. Flexible sigmoidoscopy or colonoscopy. You may have a sigmoidoscopy every 5 years or a colonoscopy every 10 years starting at age 57. Prostate cancer screening. Recommendations will vary depending on your family history and other risks. Hepatitis C blood  test. Hepatitis B blood test. Sexually transmitted disease (STD) testing. Diabetes screening. This is done by checking your blood sugar (glucose) after you have not eaten for a while (fasting). You may have this done every 1-3 years. Abdominal aortic aneurysm (AAA) screening. You may need this if you are a current or former smoker. Osteoporosis. You may be screened starting at age 28 if you are at high risk. Talk with  your health care provider about your test results, treatment options, and if necessary, the need for more tests. Vaccines  Your health care provider may recommend certain vaccines, such as: Influenza vaccine. This is recommended every year. Tetanus, diphtheria, and acellular pertussis (Tdap, Td) vaccine. You may need a Td booster every 10 years. Zoster vaccine. You may need this after age 28. Pneumococcal 13-valent conjugate (PCV13) vaccine. One dose is recommended after age 20. Pneumococcal polysaccharide (PPSV23) vaccine. One dose is recommended after age 94. Talk to your health care provider about which screenings and vaccines you need and how often you need them. This information is not intended to replace advice given to you by your health care provider. Make sure you discuss any questions you have with your health care provider. Document Released: 02/17/2015 Document Revised: 10/11/2015 Document Reviewed: 11/22/2014 Elsevier Interactive Patient Education  2017 [place] Prevention in the Home Falls can cause injuries. They can happen to people of all ages. There are many things you can do to make your home safe and to help prevent falls. What can I do on the outside of my home? Regularly fix the edges of walkways and driveways and fix any cracks. Remove anything that might make you trip as you walk through a door, such as a raised step or threshold. Trim any bushes or trees on the path to your home. Use bright outdoor lighting. Clear any walking paths of anything that might make someone trip, such as rocks or tools. Regularly check to see if handrails are loose or broken. Make sure that both sides of any steps have handrails. Any raised decks and porches should have guardrails on the edges. Have any leaves, snow, or ice cleared regularly. Use sand or salt on walking paths during winter. Clean up any spills in your garage right away. This includes oil or grease spills. What  can I do in the bathroom? Use night lights. Install grab bars by the toilet and in the tub and shower. Do not use towel bars as grab bars. Use non-skid mats or decals in the tub or shower. If you need to sit down in the shower, use a plastic, non-slip stool. Keep the floor dry. Clean up any water that spills on the floor as soon as it happens. Remove soap buildup in the tub or shower regularly. Attach bath mats securely with double-sided non-slip rug tape. Do not have throw rugs and other things on the floor that can make you trip. What can I do in the bedroom? Use night lights. Make sure that you have a light by your bed that is easy to reach. Do not use any sheets or blankets that are too big for your bed. They should not hang down onto the floor. Have a firm chair that has side arms. You can use this for support while you get dressed. Do not have throw rugs and other things on the floor that can make you trip. What can I do in the kitchen? Clean up any spills right away. Avoid walking on wet  floors. Keep items that you use a lot in easy-to-reach places. If you need to reach something above you, use a strong step stool that has a grab bar. Keep electrical cords out of the way. Do not use floor polish or wax that makes floors slippery. If you must use wax, use non-skid floor wax. Do not have throw rugs and other things on the floor that can make you trip. What can I do with my stairs? Do not leave any items on the stairs. Make sure that there are handrails on both sides of the stairs and use them. Fix handrails that are broken or loose. Make sure that handrails are as long as the stairways. Check any carpeting to make sure that it is firmly attached to the stairs. Fix any carpet that is loose or worn. Avoid having throw rugs at the top or bottom of the stairs. If you do have throw rugs, attach them to the floor with carpet tape. Make sure that you have a light switch at the top of the  stairs and the bottom of the stairs. If you do not have them, ask someone to add them for you. What else can I do to help prevent falls? Wear shoes that: Do not have high heels. Have rubber bottoms. Are comfortable and fit you well. Are closed at the toe. Do not wear sandals. If you use a stepladder: Make sure that it is fully opened. Do not climb a closed stepladder. Make sure that both sides of the stepladder are locked into place. Ask someone to hold it for you, if possible. Clearly mark and make sure that you can see: Any grab bars or handrails. First and last steps. Where the edge of each step is. Use tools that help you move around (mobility aids) if they are needed. These include: Canes. Walkers. Scooters. Crutches. Turn on the lights when you go into a dark area. Replace any light bulbs as soon as they burn out. Set up your furniture so you have a clear path. Avoid moving your furniture around. If any of your floors are uneven, fix them. If there are any pets around you, be aware of where they are. Review your medicines with your doctor. Some medicines can make you feel dizzy. This can increase your chance of falling. Ask your doctor what other things that you can do to help prevent falls. This information is not intended to replace advice given to you by your health care provider. Make sure you discuss any questions you have with your health care provider. Document Released: 11/17/2008 Document Revised: 06/29/2015 Document Reviewed: 02/25/2014 Elsevier Interactive Patient Education  2017 [employer].

## 2022-03-12 DIAGNOSIS — G4733 Obstructive sleep apnea (adult) (pediatric): Secondary | ICD-10-CM | POA: Diagnosis not present

## 2022-03-14 ENCOUNTER — Other Ambulatory Visit: Payer: Self-pay

## 2022-03-14 ENCOUNTER — Other Ambulatory Visit: Payer: Self-pay | Admitting: Internal Medicine

## 2022-03-14 MED ORDER — ROSUVASTATIN CALCIUM 40 MG PO TABS: 40.0000 mg | ORAL_TABLET | Freq: Every day | ORAL | 2 refills | Status: DC

## 2022-03-14 NOTE — Telephone Encounter (Signed)
Please refill as per office routine med refill policy (all routine meds to be refilled for 3 mo or monthly (per pt preference) up to one year from last visit, then month to month grace period for 3 mo, then further med refills will have to be denied) ? ?

## 2022-04-10 DIAGNOSIS — G4733 Obstructive sleep apnea (adult) (pediatric): Secondary | ICD-10-CM | POA: Diagnosis not present

## 2022-05-11 DIAGNOSIS — G4733 Obstructive sleep apnea (adult) (pediatric): Secondary | ICD-10-CM | POA: Diagnosis not present

## 2022-05-15 ENCOUNTER — Ambulatory Visit: Payer: No Typology Code available for payment source | Admitting: Internal Medicine

## 2022-05-24 ENCOUNTER — Ambulatory Visit: Payer: No Typology Code available for payment source | Admitting: Internal Medicine

## 2022-05-24 ENCOUNTER — Encounter: Payer: Self-pay | Admitting: Internal Medicine

## 2022-05-24 VITALS — BP 130/76 | HR 70 | Temp 98.8°F | Ht 72.0 in | Wt 227.0 lb

## 2022-05-24 DIAGNOSIS — G629 Polyneuropathy, unspecified: Secondary | ICD-10-CM

## 2022-05-24 DIAGNOSIS — Z0001 Encounter for general adult medical examination with abnormal findings: Secondary | ICD-10-CM

## 2022-05-24 DIAGNOSIS — E7800 Pure hypercholesterolemia, unspecified: Secondary | ICD-10-CM | POA: Diagnosis not present

## 2022-05-24 DIAGNOSIS — I700 Atherosclerosis of aorta: Secondary | ICD-10-CM

## 2022-05-24 DIAGNOSIS — E538 Deficiency of other specified B group vitamins: Secondary | ICD-10-CM

## 2022-05-24 DIAGNOSIS — I10 Essential (primary) hypertension: Secondary | ICD-10-CM

## 2022-05-24 DIAGNOSIS — R5383 Other fatigue: Secondary | ICD-10-CM | POA: Diagnosis not present

## 2022-05-24 DIAGNOSIS — E1165 Type 2 diabetes mellitus with hyperglycemia: Secondary | ICD-10-CM | POA: Diagnosis not present

## 2022-05-24 DIAGNOSIS — E559 Vitamin D deficiency, unspecified: Secondary | ICD-10-CM | POA: Diagnosis not present

## 2022-05-24 DIAGNOSIS — Z125 Encounter for screening for malignant neoplasm of prostate: Secondary | ICD-10-CM | POA: Diagnosis not present

## 2022-05-24 LAB — BASIC METABOLIC PANEL
BUN: 14 mg/dL (ref 6–23)
CO2: 25 mEq/L (ref 19–32)
Calcium: 9.8 mg/dL (ref 8.4–10.5)
Chloride: 101 mEq/L (ref 96–112)
Creatinine, Ser: 0.98 mg/dL (ref 0.40–1.50)
GFR: 80.46 mL/min (ref >60.00)
Glucose, Bld: 132 mg/dL — ABNORMAL HIGH (ref 70–99)
Potassium: 4.2 mEq/L (ref 3.5–5.1)
Sodium: 135 mEq/L (ref 135–145)

## 2022-05-24 LAB — URINALYSIS, ROUTINE W REFLEX MICROSCOPIC
Bilirubin Urine: NEGATIVE
Hgb urine dipstick: NEGATIVE
Ketones, ur: NEGATIVE
Leukocytes,Ua: NEGATIVE
Nitrite: NEGATIVE
RBC / HPF: NONE SEEN (ref 0–?)
Specific Gravity, Urine: <=1.005 — AB (ref 1.000–1.030)
Total Protein, Urine: NEGATIVE
Urine Glucose: NEGATIVE
Urobilinogen, UA: 0.2 (ref 0.0–1.0)
WBC, UA: NONE SEEN (ref 0–?)
pH: 6 (ref 5.0–8.0)

## 2022-05-24 LAB — TSH: TSH: 0.85 u[IU]/mL (ref 0.35–5.50)

## 2022-05-24 LAB — CBC WITH DIFFERENTIAL/PLATELET
Basophils Absolute: 0 10*3/uL (ref 0.0–0.1)
Basophils Relative: 0.5 % (ref 0.0–3.0)
Eosinophils Absolute: 0.1 10*3/uL (ref 0.0–0.7)
Eosinophils Relative: 1.3 % (ref 0.0–5.0)
HCT: 44.1 % (ref 39.0–52.0)
Hemoglobin: 14.7 g/dL (ref 13.0–17.0)
Lymphocytes Relative: 41.5 % (ref 12.0–46.0)
Lymphs Abs: 2.7 10*3/uL (ref 0.7–4.0)
MCHC: 33.3 g/dL (ref 30.0–36.0)
MCV: 88 fl (ref 78.0–100.0)
Monocytes Absolute: 0.7 10*3/uL (ref 0.1–1.0)
Monocytes Relative: 10.3 % (ref 3.0–12.0)
Neutro Abs: 3 10*3/uL (ref 1.4–7.7)
Neutrophils Relative %: 46.4 % (ref 43.0–77.0)
Platelets: 229 10*3/uL (ref 150.0–400.0)
RBC: 5.01 Mil/uL (ref 4.22–5.81)
RDW: 13.6 % (ref 11.5–15.5)
WBC: 6.5 10*3/uL (ref 4.0–10.5)

## 2022-05-24 LAB — LIPID PANEL
Cholesterol: 94 mg/dL (ref 0–200)
HDL: 34.9 mg/dL — ABNORMAL LOW (ref >39.00)
LDL Cholesterol: 49 mg/dL (ref 0–99)
NonHDL: 59.24
Total CHOL/HDL Ratio: 3
Triglycerides: 53 mg/dL (ref 0.0–149.0)
VLDL: 10.6 mg/dL (ref 0.0–40.0)

## 2022-05-24 LAB — MICROALBUMIN / CREATININE URINE RATIO
Creatinine,U: 31.4 mg/dL
Microalb Creat Ratio: 2.3 mg/g (ref 0.0–30.0)
Microalb, Ur: 0.7 mg/dL (ref 0.0–1.9)

## 2022-05-24 LAB — TESTOSTERONE: Testosterone: 316.44 ng/dL (ref 300.00–890.00)

## 2022-05-24 LAB — HEPATIC FUNCTION PANEL
ALT: 26 U/L (ref 0–53)
AST: 25 U/L (ref 0–37)
Albumin: 4.5 g/dL (ref 3.5–5.2)
Alkaline Phosphatase: 44 U/L (ref 39–117)
Bilirubin, Direct: 0.1 mg/dL (ref 0.0–0.3)
Total Bilirubin: 0.6 mg/dL (ref 0.2–1.2)
Total Protein: 7.4 g/dL (ref 6.0–8.3)

## 2022-05-24 LAB — VITAMIN D 25 HYDROXY (VIT D DEFICIENCY, FRACTURES): VITD: 47.43 ng/mL (ref 30.00–100.00)

## 2022-05-24 LAB — PSA: PSA: 2.43 ng/mL (ref 0.10–4.00)

## 2022-05-24 LAB — VITAMIN B12: Vitamin B-12: 719 pg/mL (ref 211–911)

## 2022-05-24 LAB — HEMOGLOBIN A1C: Hgb A1c MFr Bld: 7.2 % — ABNORMAL HIGH (ref 4.6–6.5)

## 2022-05-24 MED ORDER — GABAPENTIN 100 MG PO CAPS
100.0000 mg | ORAL_CAPSULE | Freq: Three times a day (TID) | ORAL | 5 refills | Status: DC
Start: 2022-05-24 — End: 2023-05-29
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
## 2022-05-24 NOTE — Patient Instructions (Addendum)
Please take all new medication as prescribed - the gabapentin 100 mg three times per day  Please continue all other medications as before, and refills have been done if requested.  Please have the pharmacy call with any other refills you may need.  Please continue your efforts at being more active, low cholesterol diet, and weight control.  You are otherwise up to date with prevention measures today.  Please keep your appointments with your specialists as you may have planned  Please go to the LAB at the blood drawing area for the tests to be done  You will be contacted by phone if any changes need to be made immediately.  Otherwise, you will receive a letter about your results with an explanation, but please check with MyChart first.  Please remember to sign up for MyChart if you have not done so, as this will be important to you in the future with finding out test results, communicating by private email, and scheduling acute appointments online when needed.  Please make an Appointment to return in 6 months, or sooner if needed

## 2022-05-24 NOTE — Progress Notes (Signed)
The test results show that your current treatment is OK, as the tests are stable.  Please continue the same plan.  There is no other need for change of treatment or further evaluation based on these results, at this time.  thanks 

## 2022-05-26 ENCOUNTER — Encounter: Payer: Self-pay | Admitting: Internal Medicine

## 2022-05-26 NOTE — Assessment & Plan Note (Signed)
Pt for exercise, low chol diet, and crestor 40 mg qd

## 2022-05-26 NOTE — Assessment & Plan Note (Signed)
Last vitamin D Lab Results  Component Value Date   VD25OH 47.43 05/24/2022   Stable, cont oral replacement

## 2022-05-26 NOTE — Assessment & Plan Note (Signed)
BP Readings from Last 3 Encounters:  05/24/22 130/76  12/26/21 138/86  12/19/21 116/80   Stable, pt to continue medical treatment losartan 100 mg qd, norvasc 5 mg qd

## 2022-05-26 NOTE — Assessment & Plan Note (Signed)
Ok for testosterone level 

## 2022-05-26 NOTE — Assessment & Plan Note (Signed)
Lab Results  Component Value Date   HGBA1C 7.2 (H) 05/24/2022   Uncontrolled, goal A1c < 7,  pt to continue current medical treatment glipizide ER 5 mg qd, metformin ER 500 mg - 4 qd

## 2022-05-26 NOTE — Assessment & Plan Note (Signed)
Lab Results  Component Value Date   LDLCALC 49 05/24/2022   Stable, pt to continue current statin crestor 40 mg qd

## 2022-05-26 NOTE — Assessment & Plan Note (Signed)
Age and sex appropriate education and counseling updated with regular exercise and diet Referrals for preventative services - none needed Immunizations addressed - for tdap at pharmacy, declines covid booster Smoking counseling  - none needed Evidence for depression or other mood disorder - none significant Most recent labs reviewed. I have personally reviewed and have noted: 1) the patient's medical and social history 2) The patient's current medications and supplements 3) The patient's height, weight, and BMI have been recorded in the chart

## 2022-05-26 NOTE — Assessment & Plan Note (Signed)
With worsening pain, for start gabapentin 100 tid, consider increase to 300 mg if no sedation

## 2022-05-27 ENCOUNTER — Other Ambulatory Visit: Payer: Self-pay

## 2022-05-27 DIAGNOSIS — Z122 Encounter for screening for malignant neoplasm of respiratory organs: Secondary | ICD-10-CM

## 2022-05-27 DIAGNOSIS — Z87891 Personal history of nicotine dependence: Secondary | ICD-10-CM

## 2022-05-27 DIAGNOSIS — N5201 Erectile dysfunction due to arterial insufficiency: Secondary | ICD-10-CM | POA: Diagnosis not present

## 2022-05-28 ENCOUNTER — Ambulatory Visit: Payer: No Typology Code available for payment source | Admitting: Internal Medicine
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
## 2022-05-29 ENCOUNTER — Ambulatory Visit: Payer: No Typology Code available for payment source | Admitting: Internal Medicine

## 2022-05-29 ENCOUNTER — Encounter: Payer: Self-pay | Admitting: Internal Medicine

## 2022-05-29 VITALS — BP 122/78 | HR 71 | Ht 72.0 in | Wt 228.0 lb

## 2022-05-29 DIAGNOSIS — E1165 Type 2 diabetes mellitus with hyperglycemia: Secondary | ICD-10-CM

## 2022-05-29 DIAGNOSIS — Z7984 Long term (current) use of oral hypoglycemic drugs: Secondary | ICD-10-CM | POA: Diagnosis not present

## 2022-05-29 DIAGNOSIS — E0590 Thyrotoxicosis, unspecified without thyrotoxic crisis or storm: Secondary | ICD-10-CM

## 2022-05-29 MED ORDER — METFORMIN HCL ER 500 MG PO TB24: ORAL_TABLET | ORAL | 3 refills | Status: DC

## 2022-05-29 MED ORDER — METHIMAZOLE 5 MG PO TABS: 5.0000 mg | ORAL_TABLET | Freq: Every day | ORAL | 3 refills | Status: DC

## 2022-05-29 MED ORDER — GLIPIZIDE ER 5 MG PO TB24: 5.0000 mg | ORAL_TABLET | Freq: Every day | ORAL | 3 refills | Status: DC
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
## 2022-05-29 NOTE — Patient Instructions (Signed)
Continue  Glipizide 5 mg XL, 1 tablet before Breakfast  Continue Metformin 500 mg 4 tablets daily before Breakfast      HOW TO TREAT LOW BLOOD SUGARS (Blood sugar LESS THAN 70 MG/DL) Please follow the RULE OF 15 for the treatment of hypoglycemia treatment (when your (blood sugars are less than 70 mg/dL)   STEP 1: Take 15 grams of carbohydrates when your blood sugar is low, which includes:  3-4 GLUCOSE TABS  OR 3-4 OZ OF JUICE OR REGULAR SODA OR ONE TUBE OF GLUCOSE GEL    STEP 2: RECHECK blood sugar in 15 MINUTES STEP 3: If your blood sugar is still low at the 15 minute recheck --> then, go back to STEP 1 and treat AGAIN with another 15 grams of carbohydrates.

## 2022-06-04 ENCOUNTER — Other Ambulatory Visit: Payer: Self-pay | Admitting: Internal Medicine

## 2022-06-04 NOTE — Telephone Encounter (Signed)
Patient also needs one touch verio test strips

## 2022-06-05 ENCOUNTER — Other Ambulatory Visit: Payer: Self-pay

## 2022-06-05 ENCOUNTER — Ambulatory Visit: Payer: No Typology Code available for payment source | Admitting: Physician Assistant

## 2022-06-05 ENCOUNTER — Telehealth: Payer: Self-pay | Admitting: Internal Medicine

## 2022-06-05 ENCOUNTER — Encounter: Payer: Self-pay | Admitting: Physician Assistant

## 2022-06-05 DIAGNOSIS — Z87891 Personal history of nicotine dependence: Secondary | ICD-10-CM

## 2022-06-05 MED ORDER — ONETOUCH VERIO W/DEVICE KIT: PACK | 0 refills | Status: DC

## 2022-06-05 NOTE — Progress Notes (Signed)
Virtual Visit via Telephone Note  I connected with Peyton Najjar on 06/05/22 at 10:00 AM EDT by telephone and verified that I am speaking with the correct person using two identifiers.  Location: Patient: home Provider: working virtually from home    I discussed the limitations, risks, security and privacy concerns of performing an evaluation and management service by telephone and the availability of in person appointments. I also discussed with the patient that there may be a patient responsible charge related to this service. The patient expressed understanding and agreed to proceed.       Shared Decision Making Visit Lung Cancer Screening Program [phone])   Eligibility: Age 67 y.o. Pack Years Smoking History Calculation 42 (# packs/per year x # years smoked) Recent History of coughing up blood  no Unexplained weight loss? no ( >Than 15 pounds within the last 6 months ) Prior History Lung / other cancer no (Diagnosis within the last 5 years already requiring surveillance chest CT Scans). Smoking Status Former Smoker Former Smokers: Years since quit: 8 years  Quit Date: 2016  Visit Components: Discussion included one or more decision making aids. yes Discussion included risk/benefits of screening. yes Discussion included potential follow up diagnostic testing for abnormal scans. yes Discussion included meaning and risk of over diagnosis. yes Discussion included meaning and risk of False Positives. yes Discussion included meaning of total radiation exposure. yes  Counseling Included: Importance of adherence to annual lung cancer LDCT screening. yes Impact of comorbidities on ability to participate in the program. yes Ability and willingness to under diagnostic treatment. yes  Smoking Cessation Counseling: Former Smokers:  Discussed the importance of maintaining cigarette abstinence. yes Diagnosis Code: Personal History of Nicotine Dependence. U04.540 Information  about tobacco cessation classes and interventions provided to patient. Yes Patient provided with "ticket" for LDCT Scan. N/a Written Order for Lung Cancer Screening with LDCT placed in Epic. Yes (CT Chest Lung Cancer Screening Low Dose W/O CM) [id number] Z12.2-Screening of respiratory organs Z87.891-Personal history of nicotine dependence   I spent 25 minutes of face to face time/virtual visit time  with the patient discussing the risks and benefits of lung cancer screening. We took the time to pause the power point at intervals to allow for questions to be asked and answered to ensure understanding. We discussed that he had taken the single most powerful action possible to decrease his risk of developing lung cancer when he quit smoking. I counseled him to remain smoke free, and to contact me if he ever had the desire to smoke again so that I can provide resources and tools to help support the effort to remain smoke free. We discussed the time and location of the scan, and that either  Abigail Miyamoto RN, Karlton Lemon, RN or I  or I will call / send a letter with the results within  24-72 hours of receiving them. He has the office contact information in the event he needs to speak with me,  he verbalized understanding of all of the above and had no further questions upon leaving the office.     I explained to the patient that there has been a high incidence of coronary artery disease noted on these exams. I explained that this is a non-gated exam therefore degree or severity cannot be determined. This patient is on statin therapy. I have asked the patient to follow-up with their PCP regarding any incidental finding of coronary artery disease and management with diet or medication as  they feel is clinically indicated. The patient verbalized understanding of the above and had no further questions.      Otilio Carpen Mordecai Tindol, PA-C

## 2022-06-05 NOTE — Patient Instructions (Signed)
Thank you for participating in the [facility] Screening [facility]. It was our pleasure to meet you today. We will call you with the results of your scan within the next few days. Your scan will be assigned a Lung RADS category score by the physicians reading the scans.  This Lung RADS score determines follow up scanning.  See below for description of categories, and follow up screening recommendations. We will be in touch to schedule your follow up screening annually or based on recommendations of our providers. We will fax a copy of your scan results to your Primary Care Physician, or the physician who referred you to the program, to ensure they have the results. Please call the office if you have any questions or concerns regarding your scanning experience or results.  Our office number is [phone]. Please speak with Denise Phelps, RN. , or  Denise Buckner RN, They are  our Lung Cancer Screening RN.'s If They are unavailable when you call, Please leave a message on the voice mail. We will return your call at our earliest convenience.This voice mail is monitored several times a day.  Remember, if your scan is normal, we will scan you annually as long as you continue to meet the criteria for the program. (Age 50-80, Current smoker or smoker who has quit within the last 15 years). If you are a smoker, remember, quitting is the single most powerful action that you can take to decrease your risk of lung cancer and other pulmonary, breathing related problems. We know quitting is hard, and we are here to help.  Please let us know if there is anything we can do to help you meet your goal of quitting. If you are a former smoker, congratulations. We are proud of you! Remain smoke free! Remember you can refer friends or family members through the number above.  We will screen them to make sure they meet criteria for the program. Thank you for helping us take better care of you by  participating in Lung Screening.  You can receive free nicotine replacement therapy ( patches, gum or mints) by calling 1-800-QUIT NOW. Please call so we can get you on the path to becoming  a non-smoker. I know it is hard, but you can do this!  Lung RADS Categories:  Lung RADS 1: no nodules or definitely non-concerning nodules.  Recommendation is for a repeat annual scan in 12 months.  Lung RADS 2:  nodules that are non-concerning in appearance and behavior with a very low likelihood of becoming an active cancer. Recommendation is for a repeat annual scan in 12 months.  Lung RADS 3: nodules that are probably non-concerning , includes nodules with a low likelihood of becoming an active cancer.  Recommendation is for a 6-month repeat screening scan. Often noted after an upper respiratory illness. We will be in touch to make sure you have no questions, and to schedule your 6-month scan.  Lung RADS 4 A: nodules with concerning findings, recommendation is most often for a follow up scan in 3 months or additional testing based on our provider's assessment of the scan. We will be in touch to make sure you have no questions and to schedule the recommended 3 month follow up scan.  Lung RADS 4 B:  indicates findings that are concerning. We will be in touch with you to schedule additional diagnostic testing based on our provider's  assessment of the scan.  Other options for assistance in smoking cessation (   As covered by your insurance benefits)  Hypnosis for smoking cessation  [employer] [phone]  Acupuncture for smoking cessation  [facility] [phone]   

## 2022-06-05 NOTE — Telephone Encounter (Signed)
Prescription Request  06/05/2022  LOV: 05/24/2022  What is the name of the medication or equipment?  glucose blood (ONETOUCH VERIO) test strip   Have you contacted your pharmacy to request a refill? Yes   Which pharmacy would you like this sent to?  [id number] - [place], [place] - [address] AT [facility] [address] [place] [address] Phone: [phone] Fax: [phone]     Patient notified that their request is being sent to the clinical staff for review and that they should receive a response within 2 business days.   Please advise at [facility] [phone]

## 2022-06-05 NOTE — Telephone Encounter (Signed)
Refill has been sent.  °

## 2022-06-06 ENCOUNTER — Other Ambulatory Visit: Payer: Self-pay

## 2022-06-06 DIAGNOSIS — E1165 Type 2 diabetes mellitus with hyperglycemia: Secondary | ICD-10-CM

## 2022-06-06 MED ORDER — ONETOUCH VERIO VI STRP
ORAL_STRIP | 12 refills | Status: DC
Start: 2022-06-06 — End: 2022-08-14

## 2022-06-10 ENCOUNTER — Other Ambulatory Visit: Payer: Self-pay

## 2022-06-10 DIAGNOSIS — G4733 Obstructive sleep apnea (adult) (pediatric): Secondary | ICD-10-CM | POA: Diagnosis not present

## 2022-06-10 MED ORDER — LANCET DEVICE MISC: 1.0000 | Freq: Three times a day (TID) | 1 refills | Status: AC

## 2022-06-10 MED ORDER — BLOOD GLUCOSE TEST VI STRP: 1.0000 | ORAL_STRIP | Freq: Three times a day (TID) | 5 refills | Status: DC

## 2022-06-10 MED ORDER — LANCETS MISC. MISC: 1.0000 | Freq: Three times a day (TID) | 1 refills | Status: AC

## 2022-06-10 MED ORDER — BLOOD GLUCOSE MONITORING SUPPL DEVI: 1.0000 | Freq: Three times a day (TID) | 1 refills | Status: DC

## 2022-06-11 MED ORDER — ACCU-CHEK GUIDE VI STRP: ORAL_STRIP | 12 refills | Status: DC

## 2022-06-11 MED ORDER — LANCETS MISC: 3 refills | Status: DC

## 2022-06-11 MED ORDER — ACCU-CHEK GUIDE ME W/DEVICE KIT: PACK | 0 refills | Status: DC

## 2022-06-11 NOTE — Progress Notes (Signed)
Ok done erx 

## 2022-06-19 ENCOUNTER — Ambulatory Visit
Admission: RE | Admit: 2022-06-19 | Discharge: 2022-06-19 | Disposition: A | Discharge status: Home / Self Care | Payer: No Typology Code available for payment source | Source: Ambulatory Visit | Attending: Internal Medicine | Admitting: Internal Medicine

## 2022-06-19 DIAGNOSIS — J439 Emphysema, unspecified: Secondary | ICD-10-CM | POA: Diagnosis not present

## 2022-06-19 DIAGNOSIS — Z122 Encounter for screening for malignant neoplasm of respiratory organs: Secondary | ICD-10-CM

## 2022-06-19 DIAGNOSIS — I700 Atherosclerosis of aorta: Secondary | ICD-10-CM | POA: Diagnosis not present

## 2022-06-19 DIAGNOSIS — Z87891 Personal history of nicotine dependence: Secondary | ICD-10-CM

## 2022-06-24 ENCOUNTER — Other Ambulatory Visit: Payer: Self-pay | Admitting: Acute Care

## 2022-06-24 DIAGNOSIS — Z122 Encounter for screening for malignant neoplasm of respiratory organs: Secondary | ICD-10-CM

## 2022-06-24 DIAGNOSIS — Z87891 Personal history of nicotine dependence: Secondary | ICD-10-CM

## 2022-07-11 DIAGNOSIS — G4733 Obstructive sleep apnea (adult) (pediatric): Secondary | ICD-10-CM | POA: Diagnosis not present

## 2022-08-02 DIAGNOSIS — E0590 Thyrotoxicosis, unspecified without thyrotoxic crisis or storm: Secondary | ICD-10-CM | POA: Diagnosis not present

## 2022-08-02 DIAGNOSIS — I700 Atherosclerosis of aorta: Secondary | ICD-10-CM | POA: Diagnosis not present

## 2022-08-02 DIAGNOSIS — E1169 Type 2 diabetes mellitus with other specified complication: Secondary | ICD-10-CM | POA: Diagnosis not present

## 2022-08-02 DIAGNOSIS — E785 Hyperlipidemia, unspecified: Secondary | ICD-10-CM | POA: Diagnosis not present

## 2022-08-02 DIAGNOSIS — I10 Essential (primary) hypertension: Secondary | ICD-10-CM | POA: Diagnosis not present

## 2022-08-02 DIAGNOSIS — G629 Polyneuropathy, unspecified: Secondary | ICD-10-CM | POA: Diagnosis not present

## 2022-08-02 DIAGNOSIS — Z008 Encounter for other general examination: Secondary | ICD-10-CM | POA: Diagnosis not present

## 2022-08-02 DIAGNOSIS — Z6830 Body mass index (BMI) 30.0-30.9, adult: Secondary | ICD-10-CM | POA: Diagnosis not present

## 2022-08-02 DIAGNOSIS — K219 Gastro-esophageal reflux disease without esophagitis: Secondary | ICD-10-CM | POA: Diagnosis not present

## 2022-08-02 DIAGNOSIS — J449 Chronic obstructive pulmonary disease, unspecified: Secondary | ICD-10-CM | POA: Diagnosis not present

## 2022-08-02 DIAGNOSIS — I771 Stricture of artery: Secondary | ICD-10-CM | POA: Diagnosis not present

## 2022-08-02 DIAGNOSIS — G4733 Obstructive sleep apnea (adult) (pediatric): Secondary | ICD-10-CM | POA: Diagnosis not present

## 2022-08-02 DIAGNOSIS — E669 Obesity, unspecified: Secondary | ICD-10-CM | POA: Diagnosis not present

## 2022-08-10 DIAGNOSIS — G4733 Obstructive sleep apnea (adult) (pediatric): Secondary | ICD-10-CM | POA: Diagnosis not present

## 2022-08-13 ENCOUNTER — Telehealth: Payer: Self-pay | Admitting: Internal Medicine

## 2022-08-13 NOTE — Telephone Encounter (Signed)
Patient said his test strips say to test 3 times a day instead of once. He would like to know if he supposed to test more often or if it was a mistake. Patient would like a call back at [phone].

## 2022-08-14 MED ORDER — ACCU-CHEK GUIDE VI STRP: ORAL_STRIP | 12 refills | Status: DC

## 2022-08-14 NOTE — Telephone Encounter (Signed)
Ok this is done erx 

## 2022-08-14 NOTE — Telephone Encounter (Signed)
See below

## 2022-09-10 DIAGNOSIS — G4733 Obstructive sleep apnea (adult) (pediatric): Secondary | ICD-10-CM | POA: Diagnosis not present

## 2022-09-19 ENCOUNTER — Telehealth: Payer: Self-pay | Admitting: Internal Medicine

## 2022-09-19 MED ORDER — CETIRIZINE HCL 10 MG PO TABS: 10.0000 mg | ORAL_TABLET | Freq: Every day | ORAL | 5 refills | Status: DC

## 2022-09-19 NOTE — Telephone Encounter (Signed)
Sent refill to [facility]../lmb 

## 2022-09-19 NOTE — Telephone Encounter (Signed)
Prescription Request  09/19/2022  LOV: 05/24/2022  What is the name of the medication or equipment?  cetirizine (ZYRTEC) 10 MG tablet  Have you contacted your pharmacy to request a refill? Yes   Which pharmacy would you like this sent to?  [id number] - [place], [place] - [address] AT [facility] OF [address] [place] [address] Phone: [phone] Fax: [phone]    Patient notified that their request is being sent to the clinical staff for review and that they should receive a response within 2 business days.   Please advise at [facility] [phone]
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
## 2022-10-02 ENCOUNTER — Ambulatory Visit (INDEPENDENT_AMBULATORY_CARE_PROVIDER_SITE_OTHER): Payer: No Typology Code available for payment source | Admitting: Sports Medicine

## 2022-10-02 VITALS — HR 77 | Ht 72.0 in | Wt 233.0 lb

## 2022-10-02 DIAGNOSIS — G8929 Other chronic pain: Secondary | ICD-10-CM

## 2022-10-02 DIAGNOSIS — M170 Bilateral primary osteoarthritis of knee: Secondary | ICD-10-CM

## 2022-10-02 DIAGNOSIS — M25561 Pain in right knee: Secondary | ICD-10-CM

## 2022-10-02 NOTE — Patient Instructions (Signed)
As needed follow up 

## 2022-10-11 DIAGNOSIS — G4733 Obstructive sleep apnea (adult) (pediatric): Secondary | ICD-10-CM | POA: Diagnosis not present

## 2022-11-10 DIAGNOSIS — G4733 Obstructive sleep apnea (adult) (pediatric): Secondary | ICD-10-CM | POA: Diagnosis not present

## 2022-11-22 ENCOUNTER — Encounter: Payer: Self-pay | Admitting: Internal Medicine

## 2022-11-22 ENCOUNTER — Ambulatory Visit: Payer: No Typology Code available for payment source | Admitting: Internal Medicine

## 2022-11-22 VITALS — BP 130/82 | HR 70 | Temp 99.0°F | Ht 72.0 in | Wt 235.0 lb

## 2022-11-22 DIAGNOSIS — E1165 Type 2 diabetes mellitus with hyperglycemia: Secondary | ICD-10-CM | POA: Diagnosis not present

## 2022-11-22 DIAGNOSIS — E559 Vitamin D deficiency, unspecified: Secondary | ICD-10-CM | POA: Diagnosis not present

## 2022-11-22 DIAGNOSIS — Z7984 Long term (current) use of oral hypoglycemic drugs: Secondary | ICD-10-CM

## 2022-11-22 DIAGNOSIS — I10 Essential (primary) hypertension: Secondary | ICD-10-CM | POA: Diagnosis not present

## 2022-11-22 DIAGNOSIS — J309 Allergic rhinitis, unspecified: Secondary | ICD-10-CM

## 2022-11-22 DIAGNOSIS — H578A1 Foreign body sensation, right eye: Secondary | ICD-10-CM

## 2022-11-22 DIAGNOSIS — E7800 Pure hypercholesterolemia, unspecified: Secondary | ICD-10-CM

## 2022-11-22 DIAGNOSIS — R972 Elevated prostate specific antigen [PSA]: Secondary | ICD-10-CM | POA: Diagnosis not present

## 2022-11-22 LAB — HEPATIC FUNCTION PANEL
ALT: 23 U/L (ref 0–53)
AST: 17 U/L (ref 0–37)
Albumin: 4.4 g/dL (ref 3.5–5.2)
Alkaline Phosphatase: 46 U/L (ref 39–117)
Bilirubin, Direct: 0.1 mg/dL (ref 0.0–0.3)
Total Bilirubin: 0.5 mg/dL (ref 0.2–1.2)
Total Protein: 7.1 g/dL (ref 6.0–8.3)

## 2022-11-22 LAB — BASIC METABOLIC PANEL
BUN: 10 mg/dL (ref 6–23)
CO2: 27 meq/L (ref 19–32)
Calcium: 10 mg/dL (ref 8.4–10.5)
Chloride: 102 meq/L (ref 96–112)
Creatinine, Ser: 0.88 mg/dL (ref 0.40–1.50)
GFR: 89.41 mL/min (ref >60.00)
Glucose, Bld: 171 mg/dL — ABNORMAL HIGH (ref 70–99)
Potassium: 4.3 meq/L (ref 3.5–5.1)
Sodium: 138 meq/L (ref 135–145)

## 2022-11-22 LAB — LIPID PANEL
Cholesterol: 96 mg/dL (ref 0–200)
HDL: 36.2 mg/dL — ABNORMAL LOW (ref >39.00)
LDL Cholesterol: 47 mg/dL (ref 0–99)
NonHDL: 59.55
Total CHOL/HDL Ratio: 3
Triglycerides: 62 mg/dL (ref 0.0–149.0)
VLDL: 12.4 mg/dL (ref 0.0–40.0)

## 2022-11-22 LAB — HEMOGLOBIN A1C: Hgb A1c MFr Bld: 7.6 % — ABNORMAL HIGH (ref 4.6–6.5)

## 2022-11-22 LAB — PSA: PSA: 1.62 ng/mL (ref 0.10–4.00)

## 2022-11-22 NOTE — Assessment & Plan Note (Signed)
BP Readings from Last 3 Encounters:  11/22/22 130/82  05/29/22 122/78  05/24/22 130/76   Stable, pt to continue medical treatment norvasc 5 every day, losartan 100 qd

## 2022-11-22 NOTE — Assessment & Plan Note (Signed)
Lab Results  Component Value Date   LDLCALC 47 11/22/2022   Stable, pt to continue current statin zetia 10 mg, crestor 40 mg
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
## 2022-11-22 NOTE — Patient Instructions (Addendum)
Please continue all other medications as before, and refills have been done if requested.  Please have the pharmacy call with any other refills you may need.  Please continue your efforts at being more active, low cholesterol diet, and weight control.  You are otherwise up to date with prevention measures today.  Please keep your appointments with your specialists as you may have planned  - endo next wk  You will be contacted regarding the referral for: Dr Dione Booze  Please go to the LAB at the blood drawing area for the tests to be done  You will be contacted by phone if any changes need to be made immediately.  Otherwise, you will receive a letter about your results with an explanation, but please check with MyChart first.  Please make an Appointment to return in 6 months, or sooner if needed

## 2022-11-22 NOTE — Assessment & Plan Note (Signed)
Asympt,  Lab Results  Component Value Date   PSA 1.62 11/22/2022   PSA 2.43 05/24/2022   PSA 1.51 04/02/2021   Pt now improved,  to f/u any worsening symptoms or concerns

## 2022-11-22 NOTE — Assessment & Plan Note (Signed)
Last vitamin D Lab Results  Component Value Date   VD25OH 47.43 05/24/2022   Stable, cont oral replacement

## 2022-11-22 NOTE — Assessment & Plan Note (Signed)
Uncontrolled, pt to restart allegra 180 every day and add nasacort asd

## 2022-11-22 NOTE — Assessment & Plan Note (Signed)
Pt will need urgent eye exam referral

## 2022-11-22 NOTE — Assessment & Plan Note (Signed)
Lab Results  Component Value Date   HGBA1C 7.6 (H) 11/22/2022   uncontrolled, pt to continue current medical treatment glipizide xl 5 every day, metfomrin ER 500  - 4 every day, but d/w pt that due to recent several low sugars and ongoing obesity worsening, he may beneift from change of glipizide to a GLP1.

## 2022-11-25 ENCOUNTER — Ambulatory Visit (INDEPENDENT_AMBULATORY_CARE_PROVIDER_SITE_OTHER): Payer: No Typology Code available for payment source

## 2022-11-25 ENCOUNTER — Encounter (HOSPITAL_COMMUNITY): Payer: Self-pay

## 2022-11-25 ENCOUNTER — Ambulatory Visit (HOSPITAL_COMMUNITY)
Admission: EM | Admit: 2022-11-25 | Discharge: 2022-11-25 | Disposition: A | Discharge status: Home / Self Care | Payer: No Typology Code available for payment source | Attending: Physician Assistant | Admitting: Physician Assistant

## 2022-11-25 DIAGNOSIS — Z23 Encounter for immunization: Secondary | ICD-10-CM | POA: Diagnosis not present

## 2022-11-25 DIAGNOSIS — S61217A Laceration without foreign body of left little finger without damage to nail, initial encounter: Secondary | ICD-10-CM

## 2022-11-25 MED ORDER — TETANUS-DIPHTH-ACELL PERTUSSIS 5-2.5-18.5 LF-MCG/0.5 IM SUSY
PREFILLED_SYRINGE | INTRAMUSCULAR | Status: AC
  Filled 2022-11-25: qty 0.5

## 2022-11-25 MED ORDER — LIDOCAINE HCL 2 % IJ SOLN
INTRAMUSCULAR | Status: AC
  Filled 2022-11-25: qty 20

## 2022-11-25 MED ORDER — TETANUS-DIPHTH-ACELL PERTUSSIS 5-2.5-18.5 LF-MCG/0.5 IM SUSY
0.5000 mL | PREFILLED_SYRINGE | Freq: Once | INTRAMUSCULAR | Status: AC
  Administered 2022-11-25: 0.5 mL via INTRAMUSCULAR
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
## 2022-11-25 NOTE — ED Triage Notes (Addendum)
Pt complaining of left pinky finger injury at this time. Pt states "I hit my left pinky finger with a tool in my garage." Pt has left pinky finger wrapped in guaze and a paper towel. Bleeding is controlled at this time. Pt reports he washed the site with antibacterial soap and water immediately after the incident however no medications taken for pain.

## 2022-11-25 NOTE — Discharge Instructions (Signed)
Keep clean and dry Return in 7-10 days for suture removal If you develop redness, swelling, or drainage return sooner.

## 2022-11-28 ENCOUNTER — Encounter: Payer: Self-pay | Admitting: Internal Medicine

## 2022-11-28 ENCOUNTER — Ambulatory Visit: Payer: No Typology Code available for payment source | Admitting: Internal Medicine

## 2022-11-28 VITALS — BP 124/80 | HR 80 | Ht 72.0 in | Wt 233.0 lb

## 2022-11-28 DIAGNOSIS — Z7984 Long term (current) use of oral hypoglycemic drugs: Secondary | ICD-10-CM | POA: Diagnosis not present

## 2022-11-28 DIAGNOSIS — Z7985 Long-term (current) use of injectable non-insulin antidiabetic drugs: Secondary | ICD-10-CM | POA: Diagnosis not present

## 2022-11-28 DIAGNOSIS — E1165 Type 2 diabetes mellitus with hyperglycemia: Secondary | ICD-10-CM

## 2022-11-28 DIAGNOSIS — E0590 Thyrotoxicosis, unspecified without thyrotoxic crisis or storm: Secondary | ICD-10-CM | POA: Diagnosis not present

## 2022-11-28 MED ORDER — METHIMAZOLE 5 MG PO TABS: 5.0000 mg | ORAL_TABLET | Freq: Every day | ORAL | 3 refills | Status: DC

## 2022-11-28 MED ORDER — SEMAGLUTIDE(0.25 OR 0.5MG/DOS) 2 MG/3ML ~~LOC~~ SOPN: 0.5000 mg | PEN_INJECTOR | SUBCUTANEOUS | 11 refills | Status: DC

## 2022-11-28 MED ORDER — METFORMIN HCL ER 500 MG PO TB24: ORAL_TABLET | ORAL | 3 refills | Status: DC

## 2022-11-28 MED ORDER — GLIPIZIDE ER 5 MG PO TB24: 5.0000 mg | ORAL_TABLET | Freq: Every day | ORAL | 3 refills | Status: DC
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
## 2022-11-28 NOTE — Patient Instructions (Addendum)
Continue  Glipizide 5 mg XL, 1 tablet before Breakfast  Continue Metformin 500 mg 4 tablets daily before Breakfast  Start Ozempic 0.25 mg once weekly for 6 weeks, then increase to 0.5 mg weekly   If Ozempic is too expensive, please contact our office to prescribe Actos 15 mg daily  A referral to  podiatry has been placed to help with nail trimming   An order for thyroid ultrasound has been placed, you will be contacted separately to schedule this     HOW TO TREAT LOW BLOOD SUGARS (Blood sugar LESS THAN 70 MG/DL) Please follow the RULE OF 15 for the treatment of hypoglycemia treatment (when your (blood sugars are less than 70 mg/dL)   STEP 1: Take 15 grams of carbohydrates when your blood sugar is low, which includes:  3-4 GLUCOSE TABS  OR 3-4 OZ OF JUICE OR REGULAR SODA OR ONE TUBE OF GLUCOSE GEL    STEP 2: RECHECK blood sugar in 15 MINUTES STEP 3: If your blood sugar is still low at the 15 minute recheck --> then, go back to STEP 1 and treat AGAIN with another 15 grams of carbohydrates.
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
## 2022-12-02 ENCOUNTER — Encounter (HOSPITAL_COMMUNITY): Payer: Self-pay

## 2022-12-02 ENCOUNTER — Ambulatory Visit (HOSPITAL_COMMUNITY)
Admission: EM | Admit: 2022-12-02 | Discharge: 2022-12-02 | Disposition: A | Discharge status: Home / Self Care | Payer: No Typology Code available for payment source

## 2022-12-02 DIAGNOSIS — Z4802 Encounter for removal of sutures: Secondary | ICD-10-CM | POA: Diagnosis not present

## 2022-12-02 NOTE — ED Triage Notes (Signed)
Patient here today to have sutures removed from left little finger.

## 2022-12-04 ENCOUNTER — Inpatient Hospital Stay
Admission: RE | Admit: 2022-12-04 | Discharge: 2022-12-04 | Discharge status: Home / Self Care | Payer: No Typology Code available for payment source | Source: Ambulatory Visit | Attending: Internal Medicine | Admitting: Internal Medicine

## 2022-12-04 DIAGNOSIS — E049 Nontoxic goiter, unspecified: Secondary | ICD-10-CM | POA: Diagnosis not present

## 2022-12-04 DIAGNOSIS — E0590 Thyrotoxicosis, unspecified without thyrotoxic crisis or storm: Secondary | ICD-10-CM

## 2022-12-10 ENCOUNTER — Other Ambulatory Visit: Payer: Self-pay

## 2022-12-10 ENCOUNTER — Encounter: Payer: Self-pay | Admitting: Podiatry

## 2022-12-10 ENCOUNTER — Ambulatory Visit (INDEPENDENT_AMBULATORY_CARE_PROVIDER_SITE_OTHER): Payer: No Typology Code available for payment source | Admitting: Podiatry

## 2022-12-10 ENCOUNTER — Other Ambulatory Visit: Payer: Self-pay | Admitting: Internal Medicine

## 2022-12-10 DIAGNOSIS — I10 Essential (primary) hypertension: Secondary | ICD-10-CM

## 2022-12-10 DIAGNOSIS — B351 Tinea unguium: Secondary | ICD-10-CM | POA: Diagnosis not present

## 2022-12-10 DIAGNOSIS — M79674 Pain in right toe(s): Secondary | ICD-10-CM | POA: Diagnosis not present

## 2022-12-10 DIAGNOSIS — M79675 Pain in left toe(s): Secondary | ICD-10-CM | POA: Diagnosis not present

## 2022-12-10 DIAGNOSIS — E119 Type 2 diabetes mellitus without complications: Secondary | ICD-10-CM | POA: Diagnosis not present
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
## 2022-12-13 ENCOUNTER — Other Ambulatory Visit: Payer: Self-pay | Admitting: Internal Medicine

## 2022-12-13 ENCOUNTER — Other Ambulatory Visit: Payer: Self-pay

## 2023-02-12 ENCOUNTER — Telehealth: Payer: Self-pay

## 2023-02-12 MED ORDER — PIOGLITAZONE HCL 15 MG PO TABS: 15.0000 mg | ORAL_TABLET | Freq: Every day | ORAL | 3 refills | Status: DC

## 2023-02-12 NOTE — Telephone Encounter (Signed)
 Patient unable to afford the Ozempic and would like to have the Actos prescribed as discussed.

## 2023-02-12 NOTE — Telephone Encounter (Signed)
 Patient aware.

## 2023-02-19 ENCOUNTER — Telehealth: Payer: Self-pay

## 2023-02-19 NOTE — Telephone Encounter (Signed)
 Patient will stop by and pick up  pen needles for his Ozempic  pen.

## 2023-02-20 DIAGNOSIS — E119 Type 2 diabetes mellitus without complications: Secondary | ICD-10-CM | POA: Diagnosis not present

## 2023-02-20 DIAGNOSIS — H2513 Age-related nuclear cataract, bilateral: Secondary | ICD-10-CM | POA: Diagnosis not present

## 2023-02-20 DIAGNOSIS — H40013 Open angle with borderline findings, low risk, bilateral: Secondary | ICD-10-CM | POA: Diagnosis not present

## 2023-02-20 LAB — HM DIABETES EYE EXAM

## 2023-02-28 ENCOUNTER — Encounter: Payer: Self-pay | Admitting: Emergency Medicine

## 2023-02-28 ENCOUNTER — Ambulatory Visit (INDEPENDENT_AMBULATORY_CARE_PROVIDER_SITE_OTHER): Payer: No Typology Code available for payment source | Admitting: Emergency Medicine

## 2023-02-28 VITALS — BP 158/95 | HR 70 | Ht 72.0 in | Wt 223.4 lb

## 2023-02-28 DIAGNOSIS — G4733 Obstructive sleep apnea (adult) (pediatric): Secondary | ICD-10-CM

## 2023-02-28 DIAGNOSIS — Z87891 Personal history of nicotine dependence: Secondary | ICD-10-CM

## 2023-02-28 DIAGNOSIS — J449 Chronic obstructive pulmonary disease, unspecified: Secondary | ICD-10-CM

## 2023-02-28 MED ORDER — SPIRIVA RESPIMAT 1.25 MCG/ACT IN AERS: 2.0000 | INHALATION_SPRAY | Freq: Every day | RESPIRATORY_TRACT | 11 refills | Status: AC

## 2023-02-28 NOTE — Patient Instructions (Addendum)
Please continue your CPAP every night. We will send another order to [facility] to ensure that you can get your CPAP supplies.  Be sure to order these when they ask you if you want them to be sent. We will do a trial of Spiriva 2 puffs once daily.  Take this every day on a schedule.  Keep track of how it helps your breathing. Keep your albuterol available to use 2 puffs when needed for shortness of breath, chest times, wheezing. We reviewed your lung cancer screening CT chest today.  Your next scan will be in May 2025 Follow with Dr. Delton Coombes in 12 months or sooner if you have any problems.
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
## 2023-02-28 NOTE — Assessment & Plan Note (Signed)
Please continue your CPAP every night. We will send another order to [facility] to ensure that you can get your CPAP supplies.  Be sure to order these when they ask you if you want them to be sent.

## 2023-02-28 NOTE — Assessment & Plan Note (Signed)
We reviewed your lung cancer screening CT chest today.  Your next scan will be in May 2025

## 2023-02-28 NOTE — Assessment & Plan Note (Signed)
We will do a trial of Spiriva 2 puffs once daily.  Take this every day on a schedule.  Keep track of how it helps your breathing. Keep your albuterol available to use 2 puffs when needed for shortness of breath, chest times, wheezing. Follow with Dr. Delton Coombes in 12 months or sooner if you have any problems.

## 2023-03-05 ENCOUNTER — Other Ambulatory Visit: Payer: Self-pay | Admitting: Internal Medicine

## 2023-03-06 ENCOUNTER — Other Ambulatory Visit: Payer: Self-pay

## 2023-03-07 ENCOUNTER — Ambulatory Visit (INDEPENDENT_AMBULATORY_CARE_PROVIDER_SITE_OTHER): Payer: No Typology Code available for payment source

## 2023-03-07 VITALS — BP 140/90 | HR 76 | Ht 70.5 in | Wt 232.0 lb

## 2023-03-07 DIAGNOSIS — Z1211 Encounter for screening for malignant neoplasm of colon: Secondary | ICD-10-CM

## 2023-03-07 DIAGNOSIS — Z0000 Encounter for general adult medical examination without abnormal findings: Secondary | ICD-10-CM

## 2023-03-07 DIAGNOSIS — Z1212 Encounter for screening for malignant neoplasm of rectum: Secondary | ICD-10-CM | POA: Diagnosis not present
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
## 2023-03-07 NOTE — Patient Instructions (Addendum)
Gabriel Coleman , Thank you for taking time to come for your Medicare Wellness Visit. I appreciate your ongoing commitment to your health goals. Please review the following plan we discussed and let me know if I can assist you in the future.   Referrals/Orders/Follow-Ups/Clinician Recommendations: It was nice to meet you today.  You are due for colonoscopy and the order has been placed.  Please call [facility], [address] [place] [address], [place], [place] [address], at [phone] to schedule your colonoscopy.  Aim for 30 minutes of exercise or brisk walking, 6-8 glasses of water, and 5 servings of fruits and vegetables each day.    This is a list of the screening recommended for you and due dates:  Health Maintenance  Topic Date Due   Colon Cancer Screening  09/15/2022   COVID-19 Vaccine (5 - 2024-25 season) 12/02/2022   Hemoglobin A1C  05/23/2023   Yearly kidney health urinalysis for diabetes  05/24/2023   Screening for Lung Cancer  06/19/2023   Yearly kidney function blood test for diabetes  11/22/2023   Complete foot exam   12/10/2023   Eye exam for diabetics  02/20/2024   Medicare Annual Wellness Visit  03/06/2024   Pneumonia Vaccine (3 of 3 - PPSV23 or PCV20) 01/03/2026   DTaP/Tdap/Td vaccine (3 - Td or Tdap) 11/24/2032   Flu Shot  Completed   Hepatitis C Screening  Completed   Zoster (Shingles) Vaccine  Completed   HPV Vaccine  Aged Out    Advanced directives: (Declined) Advance directive discussed with you today. Even though you declined this today, please call our office should you change your mind, and we can give you the proper paperwork for you to fill out.  Next Medicare Annual Wellness Visit scheduled for next year: Yes

## 2023-03-12 ENCOUNTER — Telehealth: Payer: Self-pay | Admitting: *Deleted

## 2023-03-12 NOTE — Telephone Encounter (Signed)
 Copied from CRM [id number]. Topic: General - Other >> Mar 12, 2023  1:34 PM Pinkey ORN wrote: Reason for CRM: losartan  (COZAAR ) 100 MG tablet >> Mar 12, 2023  1:37 PM Pinkey ORN wrote: Patient states he spoke with Tyus, Brendell L, CMA during his Medicare annual wellness visit and she asked if he was still taking the medication. Patient wanted to advise her that he is in fact still currently taking the medication.

## 2023-03-18 ENCOUNTER — Ambulatory Visit (INDEPENDENT_AMBULATORY_CARE_PROVIDER_SITE_OTHER): Payer: No Typology Code available for payment source | Admitting: Podiatry

## 2023-03-18 ENCOUNTER — Encounter: Payer: Self-pay | Admitting: Podiatry

## 2023-03-18 DIAGNOSIS — M79674 Pain in right toe(s): Secondary | ICD-10-CM

## 2023-03-18 DIAGNOSIS — M79675 Pain in left toe(s): Secondary | ICD-10-CM

## 2023-03-18 DIAGNOSIS — B351 Tinea unguium: Secondary | ICD-10-CM | POA: Diagnosis not present
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
## 2023-04-01 ENCOUNTER — Encounter: Payer: Self-pay | Admitting: Internal Medicine

## 2023-04-01 ENCOUNTER — Ambulatory Visit (INDEPENDENT_AMBULATORY_CARE_PROVIDER_SITE_OTHER): Payer: No Typology Code available for payment source | Admitting: Internal Medicine

## 2023-04-01 VITALS — BP 134/82 | HR 88 | Ht 70.5 in | Wt 236.0 lb

## 2023-04-01 DIAGNOSIS — E0590 Thyrotoxicosis, unspecified without thyrotoxic crisis or storm: Secondary | ICD-10-CM | POA: Diagnosis not present

## 2023-04-01 DIAGNOSIS — Z7984 Long term (current) use of oral hypoglycemic drugs: Secondary | ICD-10-CM

## 2023-04-01 DIAGNOSIS — E119 Type 2 diabetes mellitus without complications: Secondary | ICD-10-CM | POA: Diagnosis not present

## 2023-04-01 DIAGNOSIS — E1165 Type 2 diabetes mellitus with hyperglycemia: Secondary | ICD-10-CM

## 2023-04-01 LAB — POCT GLYCOSYLATED HEMOGLOBIN (HGB A1C): Hemoglobin A1C: 6.7 % — AB (ref 4.0–5.6)

## 2023-04-01 LAB — POCT GLUCOSE (DEVICE FOR HOME USE): Glucose Fasting, POC: 127 mg/dL — AB (ref 70–99)

## 2023-04-01 MED ORDER — PIOGLITAZONE HCL 15 MG PO TABS: 15.0000 mg | ORAL_TABLET | Freq: Every day | ORAL | 3 refills | Status: DC

## 2023-04-01 MED ORDER — METFORMIN HCL ER 500 MG PO TB24: 2000.0000 mg | ORAL_TABLET | Freq: Every day | ORAL | 3 refills | Status: DC

## 2023-04-01 MED ORDER — GLIPIZIDE ER 5 MG PO TB24: 5.0000 mg | ORAL_TABLET | Freq: Every day | ORAL | 3 refills | Status: DC
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
## 2023-04-01 NOTE — Patient Instructions (Signed)
 Continue  Glipizide 5 mg XL, 1 tablet before Breakfast  Continue Metformin 500 mg 4 tablets daily before Breakfast  Continue Actos (Pioglitazone) 15 mg daily   HOW TO TREAT LOW BLOOD SUGARS (Blood sugar LESS THAN 70 MG/DL) Please follow the RULE OF 15 for the treatment of hypoglycemia treatment (when your (blood sugars are less than 70 mg/dL)   STEP 1: Take 15 grams of carbohydrates when your blood sugar is low, which includes:  3-4 GLUCOSE TABS  OR 3-4 OZ OF JUICE OR REGULAR SODA OR ONE TUBE OF GLUCOSE GEL    STEP 2: RECHECK blood sugar in 15 MINUTES STEP 3: If your blood sugar is still low at the 15 minute recheck --> then, go back to STEP 1 and treat AGAIN with another 15 grams of carbohydrates.

## 2023-04-02 ENCOUNTER — Encounter: Payer: Self-pay | Admitting: Internal Medicine

## 2023-04-02 LAB — T4, FREE: Free T4: 1.2 ng/dL (ref 0.8–1.8)

## 2023-04-02 LAB — TSH: TSH: 0.81 m[IU]/L (ref 0.40–4.50)

## 2023-04-02 MED ORDER — METHIMAZOLE 5 MG PO TABS
5.0000 mg | ORAL_TABLET | Freq: Every day | ORAL | 3 refills | Status: DC
Start: 2023-04-02 — End: 2023-09-30

## 2023-04-09 DIAGNOSIS — G4733 Obstructive sleep apnea (adult) (pediatric): Secondary | ICD-10-CM | POA: Diagnosis not present

## 2023-05-29 ENCOUNTER — Ambulatory Visit (INDEPENDENT_AMBULATORY_CARE_PROVIDER_SITE_OTHER): Admitting: Internal Medicine

## 2023-05-29 ENCOUNTER — Encounter: Payer: Self-pay | Admitting: Internal Medicine

## 2023-05-29 VITALS — BP 122/80 | HR 80 | Temp 99.2°F | Ht 70.5 in | Wt 232.0 lb

## 2023-05-29 DIAGNOSIS — Z125 Encounter for screening for malignant neoplasm of prostate: Secondary | ICD-10-CM

## 2023-05-29 DIAGNOSIS — E1165 Type 2 diabetes mellitus with hyperglycemia: Secondary | ICD-10-CM | POA: Diagnosis not present

## 2023-05-29 DIAGNOSIS — J449 Chronic obstructive pulmonary disease, unspecified: Secondary | ICD-10-CM

## 2023-05-29 DIAGNOSIS — I10 Essential (primary) hypertension: Secondary | ICD-10-CM | POA: Diagnosis not present

## 2023-05-29 DIAGNOSIS — E559 Vitamin D deficiency, unspecified: Secondary | ICD-10-CM | POA: Diagnosis not present

## 2023-05-29 DIAGNOSIS — E538 Deficiency of other specified B group vitamins: Secondary | ICD-10-CM | POA: Diagnosis not present

## 2023-05-29 DIAGNOSIS — Z0001 Encounter for general adult medical examination with abnormal findings: Secondary | ICD-10-CM

## 2023-05-29 DIAGNOSIS — K219 Gastro-esophageal reflux disease without esophagitis: Secondary | ICD-10-CM

## 2023-05-29 DIAGNOSIS — Z7984 Long term (current) use of oral hypoglycemic drugs: Secondary | ICD-10-CM | POA: Diagnosis not present

## 2023-05-29 DIAGNOSIS — Z1211 Encounter for screening for malignant neoplasm of colon: Secondary | ICD-10-CM

## 2023-05-29 DIAGNOSIS — G629 Polyneuropathy, unspecified: Secondary | ICD-10-CM

## 2023-05-29 DIAGNOSIS — Z0000 Encounter for general adult medical examination without abnormal findings: Secondary | ICD-10-CM

## 2023-05-29 LAB — URINALYSIS, ROUTINE W REFLEX MICROSCOPIC
Bilirubin Urine: NEGATIVE
Hgb urine dipstick: NEGATIVE
Ketones, ur: NEGATIVE
Leukocytes,Ua: NEGATIVE
Nitrite: NEGATIVE
RBC / HPF: NONE SEEN (ref 0–?)
Specific Gravity, Urine: 1.015 (ref 1.000–1.030)
Total Protein, Urine: NEGATIVE
Urine Glucose: NEGATIVE
Urobilinogen, UA: 0.2 (ref 0.0–1.0)
pH: 6.5 (ref 5.0–8.0)

## 2023-05-29 LAB — BASIC METABOLIC PANEL WITH GFR
BUN: 17 mg/dL (ref 6–23)
CO2: 26 meq/L (ref 19–32)
Calcium: 9.9 mg/dL (ref 8.4–10.5)
Chloride: 103 meq/L (ref 96–112)
Creatinine, Ser: 0.91 mg/dL (ref 0.40–1.50)
GFR: 87.32 mL/min (ref >60.00)
Glucose, Bld: 161 mg/dL — ABNORMAL HIGH (ref 70–99)
Potassium: 3.8 meq/L (ref 3.5–5.1)
Sodium: 138 meq/L (ref 135–145)

## 2023-05-29 LAB — CBC WITH DIFFERENTIAL/PLATELET
Basophils Absolute: 0 10*3/uL (ref 0.0–0.1)
Basophils Relative: 0.4 % (ref 0.0–3.0)
Eosinophils Absolute: 0.1 10*3/uL (ref 0.0–0.7)
Eosinophils Relative: 1.8 % (ref 0.0–5.0)
HCT: 44.5 % (ref 39.0–52.0)
Hemoglobin: 15 g/dL (ref 13.0–17.0)
Lymphocytes Relative: 35.9 % (ref 12.0–46.0)
Lymphs Abs: 1.9 10*3/uL (ref 0.7–4.0)
MCHC: 33.6 g/dL (ref 30.0–36.0)
MCV: 89.3 fl (ref 78.0–100.0)
Monocytes Absolute: 0.6 10*3/uL (ref 0.1–1.0)
Monocytes Relative: 12 % (ref 3.0–12.0)
Neutro Abs: 2.6 10*3/uL (ref 1.4–7.7)
Neutrophils Relative %: 49.9 % (ref 43.0–77.0)
Platelets: 191 10*3/uL (ref 150.0–400.0)
RBC: 4.99 Mil/uL (ref 4.22–5.81)
RDW: 14.3 % (ref 11.5–15.5)
WBC: 5.3 10*3/uL (ref 4.0–10.5)

## 2023-05-29 LAB — LIPID PANEL
Cholesterol: 100 mg/dL (ref 0–200)
HDL: 38.5 mg/dL — ABNORMAL LOW (ref >39.00)
LDL Cholesterol: 44 mg/dL (ref 0–99)
NonHDL: 61.25
Total CHOL/HDL Ratio: 3
Triglycerides: 84 mg/dL (ref 0.0–149.0)
VLDL: 16.8 mg/dL (ref 0.0–40.0)

## 2023-05-29 LAB — MICROALBUMIN / CREATININE URINE RATIO
Creatinine,U: 127.1 mg/dL
Microalb Creat Ratio: 21.8 mg/g (ref 0.0–30.0)
Microalb, Ur: 2.8 mg/dL — ABNORMAL HIGH (ref 0.0–1.9)

## 2023-05-29 LAB — HEPATIC FUNCTION PANEL
ALT: 17 U/L (ref 0–53)
AST: 16 U/L (ref 0–37)
Albumin: 4.4 g/dL (ref 3.5–5.2)
Alkaline Phosphatase: 39 U/L (ref 39–117)
Bilirubin, Direct: 0.2 mg/dL (ref 0.0–0.3)
Total Bilirubin: 0.6 mg/dL (ref 0.2–1.2)
Total Protein: 7.2 g/dL (ref 6.0–8.3)

## 2023-05-29 LAB — PSA: PSA: 2.34 ng/mL (ref 0.10–4.00)

## 2023-05-29 LAB — TSH: TSH: 0.55 u[IU]/mL (ref 0.35–5.50)

## 2023-05-29 LAB — VITAMIN D 25 HYDROXY (VIT D DEFICIENCY, FRACTURES): VITD: 55.54 ng/mL (ref 30.00–100.00)

## 2023-05-29 LAB — VITAMIN B12: Vitamin B-12: 661 pg/mL (ref 211–911)

## 2023-05-29 LAB — HEMOGLOBIN A1C: Hgb A1c MFr Bld: 6.7 % — ABNORMAL HIGH (ref 4.6–6.5)

## 2023-05-29 MED ORDER — PANTOPRAZOLE SODIUM 40 MG PO TBEC: 40.0000 mg | DELAYED_RELEASE_TABLET | Freq: Every day | ORAL | 3 refills | Status: AC

## 2023-05-29 MED ORDER — FLUTICASONE-SALMETEROL 230-21 MCG/ACT IN AERO: 2.0000 | INHALATION_SPRAY | Freq: Two times a day (BID) | RESPIRATORY_TRACT | 12 refills | Status: AC

## 2023-05-29 MED ORDER — GABAPENTIN 100 MG PO CAPS: 100.0000 mg | ORAL_CAPSULE | Freq: Three times a day (TID) | ORAL | 1 refills | Status: AC

## 2023-05-29 NOTE — Assessment & Plan Note (Signed)
 Pt with difficulty with powder inhalers - for advair hfa 2 puffs qd
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
## 2023-05-29 NOTE — Assessment & Plan Note (Signed)
 Also with neuritic pain right anterior upper leg post traumatic, for restart gabapentin  100 tid

## 2023-05-29 NOTE — Assessment & Plan Note (Signed)
 Lab Results  Component Value Date   HGBA1C 6.7 (A) 04/01/2023   Stable, pt to continue current medical treatment glucotrol  xl 5 every day, metformin  ER 500 mg - 4 every day, actos  15 mg qd

## 2023-05-29 NOTE — Assessment & Plan Note (Signed)
 Mild to mod, for protonix 40 every day,  to f/u any worsening symptoms or concerns

## 2023-05-29 NOTE — Patient Instructions (Addendum)
 Please continue all other medications as before, and refills have been done for the protonix , and gabapentin   Please take all new medication as prescribed  - the Advair HFA inhaler if ok with insurance  Please have the pharmacy call with any other refills you may need.  Please continue your efforts at being more active, low cholesterol diet, and weight control.  You are otherwise up to date with prevention measures today.  Please keep your appointments with your specialists as you may have planned  You will be contacted regarding the referral for: colonoscopy  Please go to the LAB at the blood drawing area for the tests to be done  You will be contacted by phone if any changes need to be made immediately.  Otherwise, you will receive a letter about your results with an explanation, but please check with MyChart first.  Please make an Appointment to return in 6 months, or sooner if needed

## 2023-05-29 NOTE — Assessment & Plan Note (Signed)
 Age and sex appropriate education and counseling updated with regular exercise and diet Referrals for preventative services - for colonoscopy Immunizations addressed - none needed Smoking counseling  - none needed Evidence for depression or other mood disorder - none significant Most recent labs reviewed. I have personally reviewed and have noted: 1) the patient's medical and social history 2) The patient's current medications and supplements 3) The patient's height, weight, and BMI have been recorded in the chart

## 2023-05-29 NOTE — Progress Notes (Signed)
 The test results show that your current treatment is OK, as the tests are stable.  Please continue the same plan.  There is no other need for change of treatment or further evaluation based on these results, at this time.  thanks

## 2023-05-29 NOTE — Assessment & Plan Note (Signed)
 BP Readings from Last 3 Encounters:  05/29/23 122/80  04/01/23 134/82  03/07/23 (!) 140/90   Stable, pt to continue medical treatment norvasc  5 mg every day, losartan  100 qd

## 2023-05-29 NOTE — Assessment & Plan Note (Signed)
Last vitamin D Lab Results  Component Value Date   VD25OH 47.43 05/24/2022   Stable, cont oral replacement

## 2023-05-31 ENCOUNTER — Other Ambulatory Visit: Payer: Self-pay | Admitting: Family

## 2023-06-06 ENCOUNTER — Ambulatory Visit: Payer: Self-pay | Admitting: Internal Medicine

## 2023-06-06 ENCOUNTER — Telehealth: Payer: Self-pay | Admitting: Internal Medicine

## 2023-06-06 ENCOUNTER — Other Ambulatory Visit: Payer: Self-pay

## 2023-06-06 NOTE — Telephone Encounter (Signed)
 Last Fill: 08/14/22  Last OV: 05/29/23 Next OV: None Scheduled  Routing to provider for review/authorization.

## 2023-06-06 NOTE — Telephone Encounter (Signed)
 Zetia  refill has been sent.

## 2023-06-06 NOTE — Telephone Encounter (Signed)
 Agent attempted a warm transfer when patient called back. She states it was a bad connection and call disconnected. Unable to call patient for triage due to bad connection. Patient requesting medication and testing strips. Routing message to clinic. Reason for Disposition . Unable to complete triage due to phone connection issues  Protocols used: No Contact or Duplicate Contact Call-A-AH

## 2023-06-06 NOTE — Telephone Encounter (Signed)
 2 attempts to call patient. Poor signal from phone on patient's end, unable to hear what he is saying other than occasional static. Advised patient that if he can hear me to call back for nurse triage to discuss his medications and blood sugar. Pend and send request sent for medications.  Copied from CRM [id number]. Topic: Clinical - Prescription Issue >> Jun 06, 2023 11:38 AM Gabriel Coleman wrote: Reason for CRM: Patient has been waiting a week for his ezetimibe  (ZETIA ) 10 MG tablet to be refilled. He has not heard anything back. He is also completely out of test strips and cannot check his blood sugar levels.

## 2023-06-06 NOTE — Telephone Encounter (Signed)
 Unfortunately we are unable to send strips as the current rx for accuchek strips is no longer able to be done in epic  Please contact pt for name of glucometer so we can do the correct strips   thanks

## 2023-06-06 NOTE — Telephone Encounter (Signed)
 Copied from CRM [id number]. Topic: Clinical - Medication Refill >> Jun 06, 2023 11:35 AM [place] J wrote: Most Recent Primary Care Visit:  Provider: Roslyn Coombe  Department: [facility] [place]  Visit Type: OFFICE VISIT  Date: 05/29/2023  Medication:  glucose blood (ACCU-CHEK GUIDE) test strip  Has the patient contacted their pharmacy? Yes (Agent: If no, request that the patient contact the pharmacy for the refill. If patient does not wish to contact the pharmacy document the reason why and proceed with request.) (Agent: If yes, when and what did the pharmacy advise?)  Is this the correct pharmacy for this prescription? Yes If no, delete pharmacy and type the correct one.  This is the patient's preferred pharmacy:  [id number] - [place], [place] - [address] AT [facility] [address] [place] [address] Phone: [phone] Fax: [phone]  Has the prescription been filled recently? No  Is the patient out of the medication? Yes  Has the patient been seen for an appointment in the last year OR does the patient have an upcoming appointment? Yes  Can we respond through MyChart? No  Agent: Please be advised that Rx refills may take up to 3 business days. We ask that you follow-up with your pharmacy.

## 2023-06-16 ENCOUNTER — Ambulatory Visit (INDEPENDENT_AMBULATORY_CARE_PROVIDER_SITE_OTHER): Payer: No Typology Code available for payment source | Admitting: Podiatry

## 2023-06-16 DIAGNOSIS — M79675 Pain in left toe(s): Secondary | ICD-10-CM | POA: Diagnosis not present

## 2023-06-16 DIAGNOSIS — M79674 Pain in right toe(s): Secondary | ICD-10-CM

## 2023-06-16 DIAGNOSIS — B351 Tinea unguium: Secondary | ICD-10-CM | POA: Diagnosis not present
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
## 2023-06-23 ENCOUNTER — Telehealth: Payer: Self-pay | Admitting: Internal Medicine

## 2023-06-23 DIAGNOSIS — E1169 Type 2 diabetes mellitus with other specified complication: Secondary | ICD-10-CM | POA: Diagnosis not present

## 2023-06-23 DIAGNOSIS — E785 Hyperlipidemia, unspecified: Secondary | ICD-10-CM | POA: Diagnosis not present

## 2023-06-23 DIAGNOSIS — Z6830 Body mass index (BMI) 30.0-30.9, adult: Secondary | ICD-10-CM | POA: Diagnosis not present

## 2023-06-23 DIAGNOSIS — E669 Obesity, unspecified: Secondary | ICD-10-CM | POA: Diagnosis not present

## 2023-06-23 DIAGNOSIS — I2510 Atherosclerotic heart disease of native coronary artery without angina pectoris: Secondary | ICD-10-CM | POA: Diagnosis not present

## 2023-06-23 DIAGNOSIS — Z008 Encounter for other general examination: Secondary | ICD-10-CM | POA: Diagnosis not present

## 2023-06-23 NOTE — Telephone Encounter (Signed)
 Copied from CRM [id number]. Topic: Referral - Status >> Jun 23, 2023  3:56 PM Aisha D wrote: Reason for CRM: Belideth with [facility] is calling to see if the pt has the referral for the coloscopy. Audra Blend stated that she spoke with the pt and they stated that they haven't heard anything since April. Audra Blend would like for someone to contact the pt regarding the referral status.

## 2023-06-24 NOTE — Telephone Encounter (Signed)
 Called and left voice mail

## 2023-07-08 ENCOUNTER — Other Ambulatory Visit: Payer: Self-pay | Admitting: Emergency Medicine

## 2023-07-08 DIAGNOSIS — Z122 Encounter for screening for malignant neoplasm of respiratory organs: Secondary | ICD-10-CM

## 2023-07-08 DIAGNOSIS — Z87891 Personal history of nicotine dependence: Secondary | ICD-10-CM

## 2023-07-10 DIAGNOSIS — G4733 Obstructive sleep apnea (adult) (pediatric): Secondary | ICD-10-CM | POA: Diagnosis not present

## 2023-07-14 ENCOUNTER — Other Ambulatory Visit: Payer: Self-pay | Admitting: Internal Medicine

## 2023-07-17 ENCOUNTER — Ambulatory Visit
Admission: RE | Admit: 2023-07-17 | Discharge: 2023-07-17 | Disposition: A | Discharge status: Home / Self Care | Source: Ambulatory Visit | Attending: Acute Care | Admitting: Acute Care

## 2023-07-17 DIAGNOSIS — Z87891 Personal history of nicotine dependence: Secondary | ICD-10-CM | POA: Diagnosis not present

## 2023-07-17 DIAGNOSIS — Z122 Encounter for screening for malignant neoplasm of respiratory organs: Secondary | ICD-10-CM | POA: Diagnosis not present

## 2023-08-04 ENCOUNTER — Other Ambulatory Visit: Payer: Self-pay | Admitting: Acute Care

## 2023-08-04 DIAGNOSIS — Z87891 Personal history of nicotine dependence: Secondary | ICD-10-CM

## 2023-08-04 DIAGNOSIS — Z122 Encounter for screening for malignant neoplasm of respiratory organs: Secondary | ICD-10-CM

## 2023-08-17 IMAGING — CT CT CARDIAC CORONARY ARTERY CALCIUM SCORE
3 series · 14 of 20 positions shown, 16 images · non-contrast
Comparison: 03/24/2017 chest radiograph.
COMPARISON: 03/24/2017 chest radiograph.

Addendum:
EXAM:
OVER-READ INTERPRETATION  CT CHEST

The following report is an over-read performed by radiologist Dr.
Alecia Tienda [REDACTED] on 03/28/2021. This over-read
does not include interpretation of cardiac or coronary anatomy or
pathology. The calcium score interpretation by the cardiologist is
attached.
CLINICAL DATA: Cardiovascular Disease Risk stratification
Coronary Calcium Score
TECHNIQUE: A gated, non-contrast computed tomography scan of the heart was
performed using 3mm slice thickness. Axial images were analyzed on a
dedicated workstation. Calcium scoring of the coronary arteries was
performed using the Agatston method.

[Series 2: cascseq 2.0 sa36 (id) (id) · axial · 0.42mm/px · z∈[-290,-188]mm · 4 of 85 slices shown]
[im 17/85  vessel]
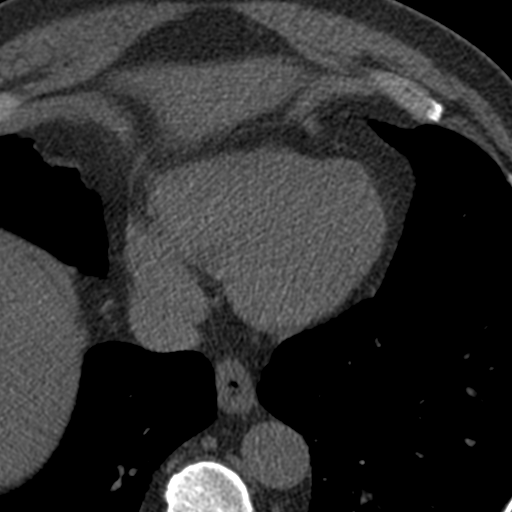
[im 34/85  vessel]
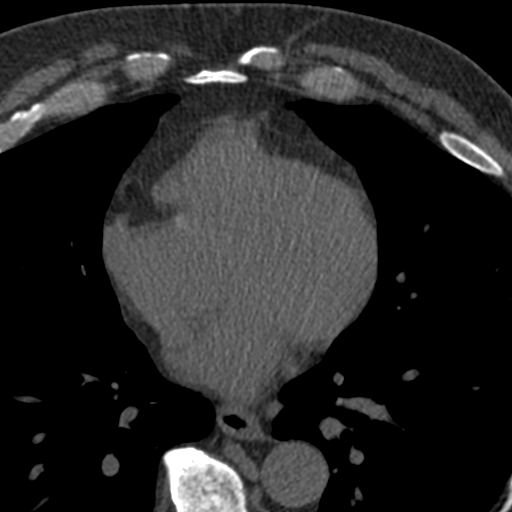
[im 51/85  vessel]
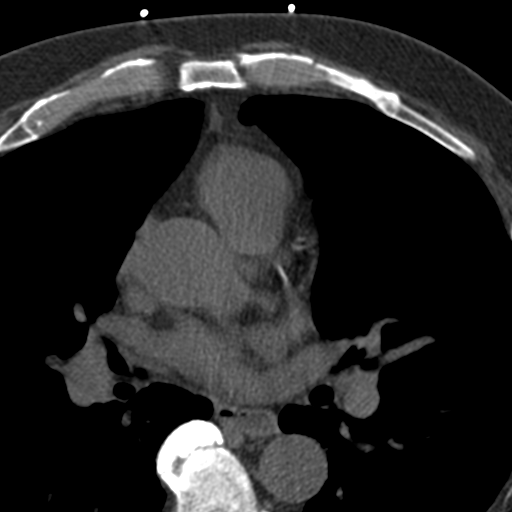
[im 68/85  vessel]
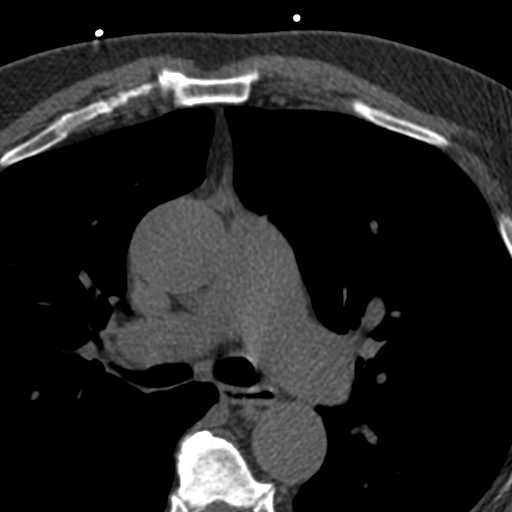

[Series 3: cascseq 2.0 bf37 st · axial · 0.75mm/px · z∈[-294,-182]mm · 5 of 85 slices shown, 7 images]
[im 15/85  vessel]
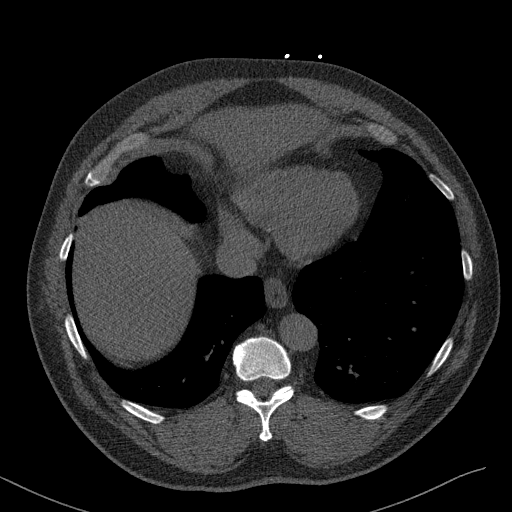
[im 15/85  lung]
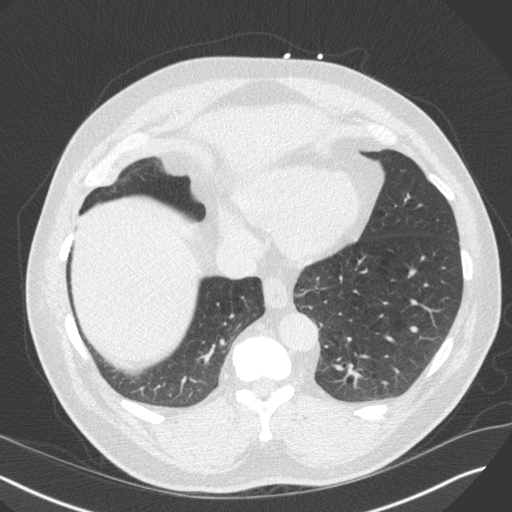
[im 29/85  vessel]
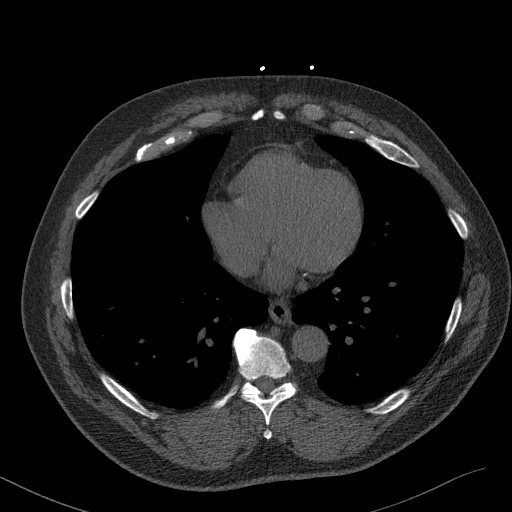
[im 43/85  vessel]
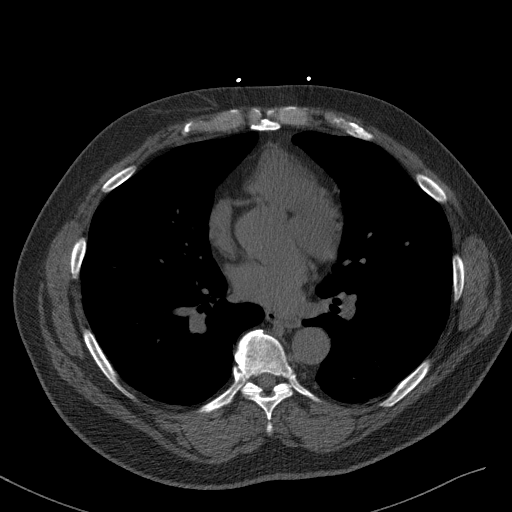
[im 57/85  vessel]
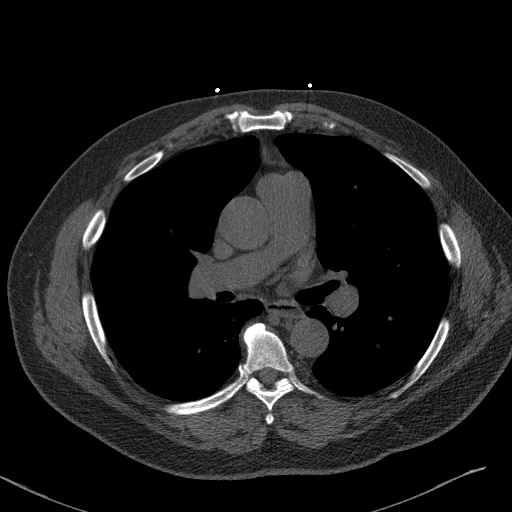
[im 71/85  vessel]
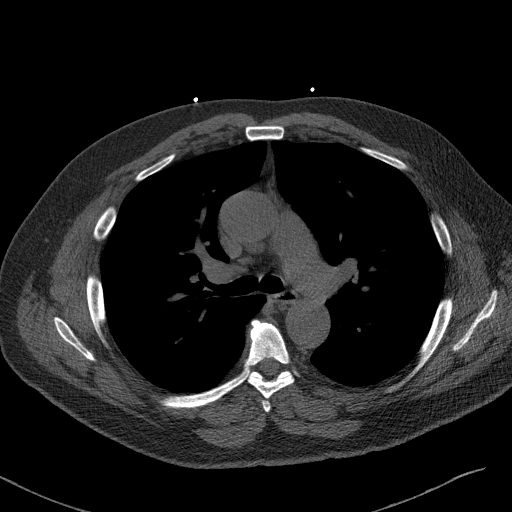
[im 71/85  lung]
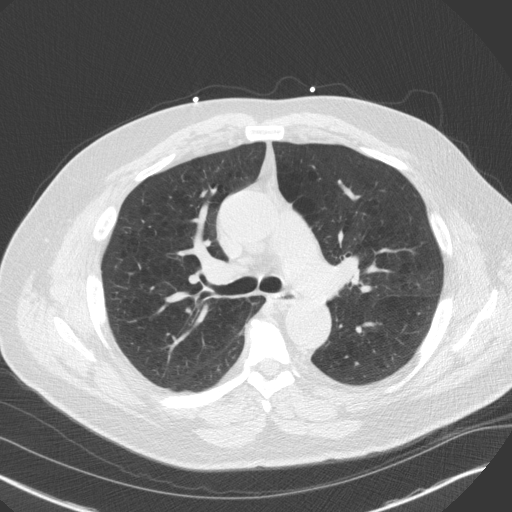

[Series 4: cascseq 2.0 br59 lung · axial · 0.75mm/px · z∈[-294,-182]mm · 5 of 85 slices shown]
[im 15/85  lung]
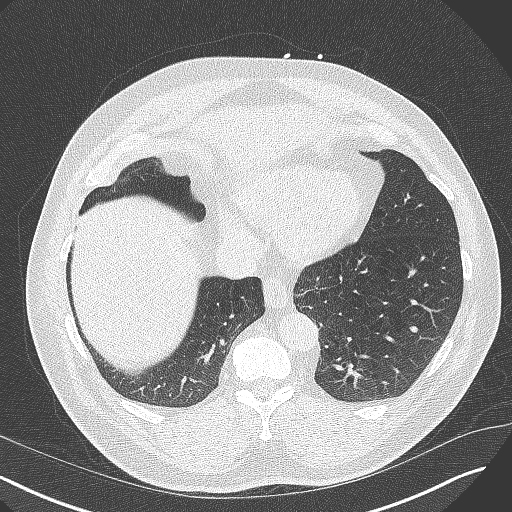
[im 29/85  lung]
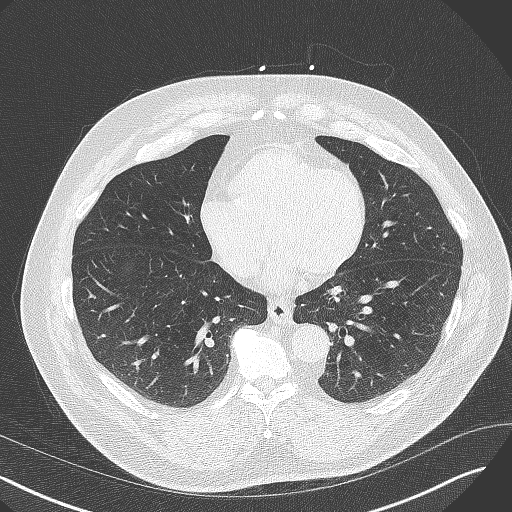
[im 43/85  lung]
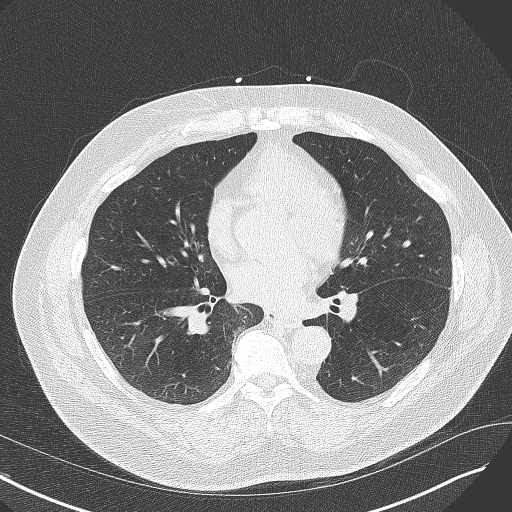
[im 57/85  lung]
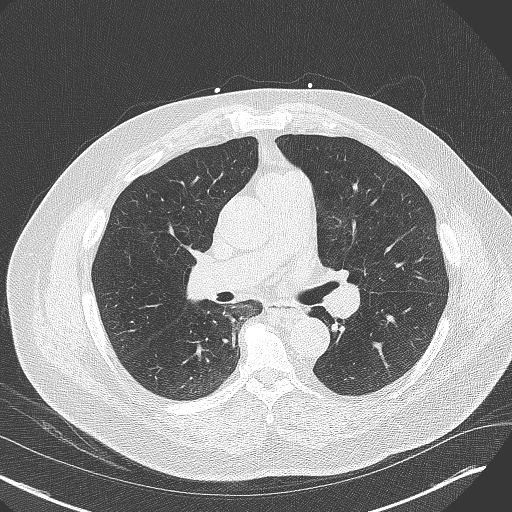
[im 71/85  lung]
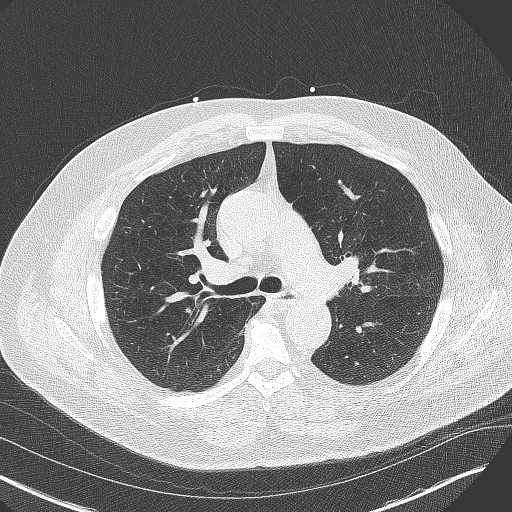

[14 of 20 positions shown; findings below may reference images not displayed]

FINDINGS: Vascular: Aortic atherosclerosis. Tortuous thoracic aorta. Pulmonary
artery enlargement, outflow tract 3.2 cm

Mediastinum/Nodes: No imaged thoracic adenopathy.

Subtle air-fluid or air debris level in the esophagus on [DATE].

Lungs/Pleura: No pleural fluid.  Centrilobular emphysema.

Upper Abdomen: Incompletely imaged hepatomegaly and hepatic
steatosis. Normal imaged portions of the spleen, stomach.

Musculoskeletal: No acute osseous abnormality.
IMPRESSION: 1.  No acute findings in the imaged extracardiac chest.
2. Pulmonary artery enlargement suggests pulmonary arterial
hypertension.
3. Esophageal air fluid level suggests dysmotility or
gastroesophageal reflux.
4. Aortic atherosclerosis (80N35-PRQ.Q) and emphysema (80N35-45D.S).
5. Hepatic steatosis and hepatomegaly.
FINDINGS: Coronary arteries: Normal origins.

Coronary Calcium Score:

Left main: 0

Left anterior descending artery: 257

Left circumflex artery:

Right coronary artery:

Total: 260

Percentile: 72nd

Pericardium: Normal.

Ascending Aorta: Dilated to 4.0 cm

Non-cardiac: See separate report from [REDACTED].
IMPRESSION: Coronary calcium score of 260 Agatston units. This was 72nd
percentile for age-, race-, and sex-matched controls.



If CAC=0, it is reasonable to withhold statin therapy and reassess
in 5 to 10 years, as long as higher risk conditions are absent
(diabetes mellitus, family history of premature CHD in first degree
relatives (males <55 years; females <65 years), cigarette smoking,
or LDL >=190 mg/dL).

If CAC is 1 to 99, it is reasonable to initiate statin therapy for
patients >=55 years of age.

If CAC is >=100 or >=75th percentile, it is reasonable to initiate
statin therapy at any age.

Cardiology referral should be considered for patients with CAC
scores >=400 or >=75th percentile.

*6115 AHA/ACC/AACVPR/AAPA/ABC/KLEVER/ANNAKARIN/JUMPER/Jimmy Arturo/BRANDIN/BERHAN/TSCHICK
Guideline on the Management of Blood Cholesterol: A Report of the
American College of Cardiology/American Heart Association Task Force
on Clinical Practice Guidelines. J Am Coll Cardiol.
5357;73(24):6027-6421.

*** End of Addendum ***
EXAM:
OVER-READ INTERPRETATION  CT CHEST

The following report is an over-read performed by radiologist Dr.
Alecia Tienda [REDACTED] on 03/28/2021. This over-read
does not include interpretation of cardiac or coronary anatomy or
pathology. The calcium score interpretation by the cardiologist is
attached.
FINDINGS: Vascular: Aortic atherosclerosis. Tortuous thoracic aorta. Pulmonary
artery enlargement, outflow tract 3.2 cm

Mediastinum/Nodes: No imaged thoracic adenopathy.

Subtle air-fluid or air debris level in the esophagus on [DATE].

Lungs/Pleura: No pleural fluid.  Centrilobular emphysema.

Upper Abdomen: Incompletely imaged hepatomegaly and hepatic
steatosis. Normal imaged portions of the spleen, stomach.

Musculoskeletal: No acute osseous abnormality.
IMPRESSION: 1.  No acute findings in the imaged extracardiac chest.
2. Pulmonary artery enlargement suggests pulmonary arterial
hypertension.
3. Esophageal air fluid level suggests dysmotility or
gastroesophageal reflux.
4. Aortic atherosclerosis (80N35-PRQ.Q) and emphysema (80N35-45D.S).
5. Hepatic steatosis and hepatomegaly.

## 2023-08-21 ENCOUNTER — Other Ambulatory Visit: Payer: Self-pay | Admitting: Internal Medicine

## 2023-09-16 ENCOUNTER — Encounter: Payer: Self-pay | Admitting: Podiatry

## 2023-09-16 ENCOUNTER — Ambulatory Visit: Admitting: Podiatry

## 2023-09-16 DIAGNOSIS — B351 Tinea unguium: Secondary | ICD-10-CM

## 2023-09-16 DIAGNOSIS — M79675 Pain in left toe(s): Secondary | ICD-10-CM | POA: Diagnosis not present

## 2023-09-16 DIAGNOSIS — M79674 Pain in right toe(s): Secondary | ICD-10-CM
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
## 2023-09-27 ENCOUNTER — Other Ambulatory Visit: Payer: Self-pay | Admitting: Internal Medicine

## 2023-09-29 ENCOUNTER — Encounter: Payer: Self-pay | Admitting: Internal Medicine

## 2023-09-29 ENCOUNTER — Ambulatory Visit: Payer: No Typology Code available for payment source | Admitting: Internal Medicine

## 2023-09-29 VITALS — BP 130/80 | HR 81 | Ht 70.5 in | Wt 233.0 lb

## 2023-09-29 DIAGNOSIS — E0590 Thyrotoxicosis, unspecified without thyrotoxic crisis or storm: Secondary | ICD-10-CM | POA: Diagnosis not present

## 2023-09-29 DIAGNOSIS — Z7984 Long term (current) use of oral hypoglycemic drugs: Secondary | ICD-10-CM

## 2023-09-29 DIAGNOSIS — R109 Unspecified abdominal pain: Secondary | ICD-10-CM

## 2023-09-29 DIAGNOSIS — E1165 Type 2 diabetes mellitus with hyperglycemia: Secondary | ICD-10-CM

## 2023-09-29 LAB — POCT GLYCOSYLATED HEMOGLOBIN (HGB A1C): Hemoglobin A1C: 7.7 % — AB (ref 4.0–5.6)

## 2023-09-29 LAB — TSH: TSH: 0.57 m[IU]/L (ref 0.40–4.50)

## 2023-09-29 LAB — POCT GLUCOSE (DEVICE FOR HOME USE): POC Glucose: 233 mg/dL — AB (ref 70–99)

## 2023-09-29 LAB — T4, FREE: Free T4: 1.4 ng/dL (ref 0.8–1.8)

## 2023-09-29 MED ORDER — METFORMIN HCL ER 500 MG PO TB24: 1000.0000 mg | ORAL_TABLET | Freq: Two times a day (BID) | ORAL | 3 refills | Status: DC

## 2023-09-29 MED ORDER — GLIPIZIDE ER 5 MG PO TB24: 5.0000 mg | ORAL_TABLET | Freq: Every day | ORAL | 3 refills | Status: DC

## 2023-09-29 MED ORDER — PIOGLITAZONE HCL 15 MG PO TABS: 15.0000 mg | ORAL_TABLET | Freq: Every day | ORAL | 3 refills | Status: DC
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
## 2023-09-29 NOTE — Patient Instructions (Addendum)
 Continue  Glipizide  5 mg XL, 1 tablet before Breakfast  Take Metformin  500 mg, 2 tablets before Breakfast and 2 tablets daily before supper Continue Actos  (Pioglitazone ) 15 mg daily      HOW TO TREAT LOW BLOOD SUGARS (Blood sugar LESS THAN 70 MG/DL) Please follow the RULE OF 15 for the treatment of hypoglycemia treatment (when your (blood sugars are less than 70 mg/dL)   STEP 1: Take 15 grams of carbohydrates when your blood sugar is low, which includes:  3-4 GLUCOSE TABS  OR 3-4 OZ OF JUICE OR REGULAR SODA OR ONE TUBE OF GLUCOSE GEL    STEP 2: RECHECK blood sugar in 15 MINUTES STEP 3: If your blood sugar is still low at the 15 minute recheck --> then, go back to STEP 1 and treat AGAIN with another 15 grams of carbohydrates.

## 2023-09-30 ENCOUNTER — Other Ambulatory Visit: Payer: Self-pay

## 2023-09-30 ENCOUNTER — Ambulatory Visit: Payer: Self-pay | Admitting: Internal Medicine

## 2023-09-30 MED ORDER — METFORMIN HCL ER 500 MG PO TB24: ORAL_TABLET | ORAL | 3 refills | Status: DC

## 2023-09-30 MED ORDER — METHIMAZOLE 5 MG PO TABS: 5.0000 mg | ORAL_TABLET | Freq: Every day | ORAL | 3 refills | Status: DC

## 2023-10-01 DIAGNOSIS — G4733 Obstructive sleep apnea (adult) (pediatric): Secondary | ICD-10-CM | POA: Diagnosis not present

## 2023-11-28 ENCOUNTER — Ambulatory Visit (INDEPENDENT_AMBULATORY_CARE_PROVIDER_SITE_OTHER): Admitting: Internal Medicine

## 2023-11-28 ENCOUNTER — Encounter: Payer: Self-pay | Admitting: Internal Medicine

## 2023-11-28 ENCOUNTER — Ambulatory Visit: Payer: Self-pay | Admitting: Internal Medicine

## 2023-11-28 VITALS — BP 148/86 | HR 59 | Temp 98.6°F | Ht 70.5 in | Wt 236.0 lb

## 2023-11-28 DIAGNOSIS — E7800 Pure hypercholesterolemia, unspecified: Secondary | ICD-10-CM

## 2023-11-28 DIAGNOSIS — E559 Vitamin D deficiency, unspecified: Secondary | ICD-10-CM

## 2023-11-28 DIAGNOSIS — M170 Bilateral primary osteoarthritis of knee: Secondary | ICD-10-CM

## 2023-11-28 DIAGNOSIS — R1084 Generalized abdominal pain: Secondary | ICD-10-CM

## 2023-11-28 DIAGNOSIS — M7071 Other bursitis of hip, right hip: Secondary | ICD-10-CM

## 2023-11-28 DIAGNOSIS — R931 Abnormal findings on diagnostic imaging of heart and coronary circulation: Secondary | ICD-10-CM

## 2023-11-28 DIAGNOSIS — I10 Essential (primary) hypertension: Secondary | ICD-10-CM

## 2023-11-28 DIAGNOSIS — Z860101 Personal history of adenomatous and serrated colon polyps: Secondary | ICD-10-CM

## 2023-11-28 DIAGNOSIS — Z7984 Long term (current) use of oral hypoglycemic drugs: Secondary | ICD-10-CM

## 2023-11-28 DIAGNOSIS — E1165 Type 2 diabetes mellitus with hyperglycemia: Secondary | ICD-10-CM

## 2023-11-28 LAB — LIPASE: Lipase: 72 U/L — ABNORMAL HIGH (ref 11.0–59.0)

## 2023-11-28 LAB — HEPATIC FUNCTION PANEL
ALT: 19 U/L (ref 0–53)
AST: 18 U/L (ref 0–37)
Albumin: 4.6 g/dL (ref 3.5–5.2)
Alkaline Phosphatase: 47 U/L (ref 39–117)
Bilirubin, Direct: 0.2 mg/dL (ref 0.0–0.3)
Total Bilirubin: 0.6 mg/dL (ref 0.2–1.2)
Total Protein: 7.9 g/dL (ref 6.0–8.3)

## 2023-11-28 LAB — BASIC METABOLIC PANEL WITH GFR
BUN: 12 mg/dL (ref 6–23)
CO2: 25 meq/L (ref 19–32)
Calcium: 9.9 mg/dL (ref 8.4–10.5)
Chloride: 103 meq/L (ref 96–112)
Creatinine, Ser: 0.92 mg/dL (ref 0.40–1.50)
GFR: 85.88 mL/min (ref >60.00)
Glucose, Bld: 99 mg/dL (ref 70–99)
Potassium: 4.1 meq/L (ref 3.5–5.1)
Sodium: 140 meq/L (ref 135–145)

## 2023-11-28 LAB — URINALYSIS, ROUTINE W REFLEX MICROSCOPIC
Bilirubin Urine: NEGATIVE
Hgb urine dipstick: NEGATIVE
Ketones, ur: NEGATIVE
Leukocytes,Ua: NEGATIVE
Nitrite: NEGATIVE
Specific Gravity, Urine: 1.01 (ref 1.000–1.030)
Total Protein, Urine: NEGATIVE
Urine Glucose: NEGATIVE
Urobilinogen, UA: 1 (ref 0.0–1.0)
pH: 6 (ref 5.0–8.0)

## 2023-11-28 LAB — CBC WITH DIFFERENTIAL/PLATELET
Basophils Absolute: 0 K/uL (ref 0.0–0.1)
Basophils Relative: 0.5 % (ref 0.0–3.0)
Eosinophils Absolute: 0.1 K/uL (ref 0.0–0.7)
Eosinophils Relative: 2 % (ref 0.0–5.0)
HCT: 46.3 % (ref 39.0–52.0)
Hemoglobin: 15.3 g/dL (ref 13.0–17.0)
Lymphocytes Relative: 41.5 % (ref 12.0–46.0)
Lymphs Abs: 2.9 K/uL (ref 0.7–4.0)
MCHC: 33 g/dL (ref 30.0–36.0)
MCV: 88 fl (ref 78.0–100.0)
Monocytes Absolute: 0.8 K/uL (ref 0.1–1.0)
Monocytes Relative: 11.7 % (ref 3.0–12.0)
Neutro Abs: 3.1 K/uL (ref 1.4–7.7)
Neutrophils Relative %: 44.3 % (ref 43.0–77.0)
Platelets: 201 K/uL (ref 150.0–400.0)
RBC: 5.26 Mil/uL (ref 4.22–5.81)
RDW: 13.5 % (ref 11.5–15.5)
WBC: 6.9 K/uL (ref 4.0–10.5)

## 2023-11-28 LAB — LIPID PANEL
Cholesterol: 109 mg/dL (ref 0–200)
HDL: 36.5 mg/dL — ABNORMAL LOW (ref >39.00)
LDL Cholesterol: 49 mg/dL (ref 0–99)
NonHDL: 72.32
Total CHOL/HDL Ratio: 3
Triglycerides: 118 mg/dL (ref 0.0–149.0)
VLDL: 23.6 mg/dL (ref 0.0–40.0)

## 2023-11-28 MED ORDER — CETIRIZINE HCL 10 MG PO TABS: 10.0000 mg | ORAL_TABLET | Freq: Every day | ORAL | 3 refills | Status: AC

## 2023-11-28 NOTE — Progress Notes (Signed)
 The test results show that your current treatment is OK, as the tests are stable.  Please continue the same plan.  There is no other need for change of treatment or further evaluation based on these results, at this time.  thanks
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
## 2023-11-28 NOTE — Patient Instructions (Signed)
 Please continue all other medications as before, and refills have been done for the cetirizine   Please have the pharmacy call with any other refills you may need.  Please continue your efforts at being more active, low cholesterol diet, and weight control.  You are otherwise up to date with prevention measures today.  Please keep your appointments with your specialists as you may have planned  You will be contacted regarding the referral for: Colonoscopy  You will be contacted regarding the referral for: CT abdomen pelvis  You will be contacted regarding the referral for: Sports Medicine for the right hip and both knees  Please go to the LAB at the blood drawing area for the tests to be done  You will be contacted by phone if any changes need to be made immediately.  Otherwise, you will receive a letter about your results with an explanation, but please check with MyChart first.  Please make an Appointment to return in 6 months, or sooner if needed

## 2023-11-28 NOTE — Assessment & Plan Note (Addendum)
 With hyperglycemia, no insulin  Lab Results  Component Value Date   HGBA1C 7.7 (A) 09/29/2023   Uncontrolled but tolerating increased metformin , pt to continue current medical treatment glucotrol  xl 5 mg every day, metformin  ER 500 mg - 2 bid, actos  15 mg every day and plans to f/u endo as planned

## 2023-12-01 ENCOUNTER — Encounter: Payer: Self-pay | Admitting: Internal Medicine

## 2023-12-01 DIAGNOSIS — Z860101 Personal history of adenomatous and serrated colon polyps: Secondary | ICD-10-CM | POA: Insufficient documentation

## 2023-12-01 DIAGNOSIS — M7070 Other bursitis of hip, unspecified hip: Secondary | ICD-10-CM | POA: Insufficient documentation

## 2023-12-01 DIAGNOSIS — M170 Bilateral primary osteoarthritis of knee: Secondary | ICD-10-CM | POA: Insufficient documentation

## 2023-12-01 DIAGNOSIS — R1084 Generalized abdominal pain: Secondary | ICD-10-CM | POA: Insufficient documentation

## 2023-12-01 NOTE — Assessment & Plan Note (Signed)
 With right flank and abd pain - for labs including cbc and urine cx, also for CT abd pelvis r/o hernia vs stone vs other

## 2023-12-01 NOTE — Assessment & Plan Note (Signed)
 Also with recent worsening pain, also for sport med referral possible cortisone needed

## 2023-12-01 NOTE — Assessment & Plan Note (Signed)
 Stable overall, to continue asa 81 mg every day, crestor  now at 40 mg qd

## 2023-12-01 NOTE — Assessment & Plan Note (Signed)
 Mild to mod, for sports medicine referral, to f/u any worsening symptoms or concerns

## 2023-12-01 NOTE — Assessment & Plan Note (Signed)
 Lab Results  Component Value Date   LDLCALC 49 11/28/2023   Stable, pt to continue current statin crestor  40 mg every day, zetia  10 mg qd

## 2023-12-01 NOTE — Assessment & Plan Note (Signed)
 Last vitamin D  Lab Results  Component Value Date   VD25OH 55.54 05/29/2023   Stable, cont oral replacement

## 2023-12-01 NOTE — Assessment & Plan Note (Signed)
Due for colonoscopy 

## 2023-12-01 NOTE — Assessment & Plan Note (Signed)
 BP Readings from Last 3 Encounters:  11/28/23 (!) 148/86  09/29/23 130/80  05/29/23 122/80   Uncontrolled, likely reactive,, pt to continue medical treatment norvasc  5 mg every day, losartan  100 mg every day, declines other change

## 2023-12-05 ENCOUNTER — Other Ambulatory Visit: Payer: Self-pay | Admitting: Internal Medicine

## 2023-12-08 ENCOUNTER — Other Ambulatory Visit: Payer: Self-pay

## 2023-12-08 ENCOUNTER — Encounter: Payer: Self-pay | Admitting: Family Medicine

## 2023-12-08 ENCOUNTER — Ambulatory Visit: Admitting: Family Medicine

## 2023-12-08 ENCOUNTER — Ambulatory Visit (INDEPENDENT_AMBULATORY_CARE_PROVIDER_SITE_OTHER)

## 2023-12-08 VITALS — BP 142/90 | HR 86 | Ht 70.5 in | Wt 234.0 lb

## 2023-12-08 DIAGNOSIS — M25561 Pain in right knee: Secondary | ICD-10-CM

## 2023-12-08 DIAGNOSIS — M170 Bilateral primary osteoarthritis of knee: Secondary | ICD-10-CM

## 2023-12-08 DIAGNOSIS — M25551 Pain in right hip: Secondary | ICD-10-CM

## 2023-12-08 DIAGNOSIS — G8929 Other chronic pain: Secondary | ICD-10-CM

## 2023-12-08 DIAGNOSIS — M25562 Pain in left knee: Secondary | ICD-10-CM

## 2023-12-08 NOTE — Patient Instructions (Addendum)
 Thank you for coming in today.   You received an injection today. Seek immediate medical attention if the joint becomes red, extremely painful, or is oozing fluid.   Please get an Xray today before you leave   A referral for physical therapy has been submitted. A representative from the physical therapy office will contact you to coordinate scheduling after confirming your benefits with your insurance provider. If you do not hear from the physical therapy office within the next 1-2 weeks, please let us  know.   See you back in 1 week for the left knee injection.

## 2023-12-08 NOTE — Progress Notes (Signed)
 I, Leotis Batter, CMA acting as a scribe for Artist Lloyd, MD.  Gabriel Coleman is a 68 y.o. male who presents to [facility] today for R hip pain x 2-3 months. Pt locates pain to lateral aspect of the hip.   Radiates: denies Aggravates: side sleeping Treatments tried: changing positions in bed  Pt also c/o bilat knee pain, L>R, x several months. The knees feel very stiff. Locates pain throughout the entire joint. Denies lower leg pain.   Swelling: R knee Mechanical symptoms: denies  Dx imaging: 09/24/21 R & L knee XR  Pertinent review of systems: No fevers or chills  Relevant historical information: Hypertension and diabetes.  A1c 7.7 about 2 months ago.   Exam:  BP (!) 142/90   Pulse 86   Ht 5' 10.5 (1.791 m)   Wt 234 lb (106.1 kg)   SpO2 92%   BMI 33.10 kg/m  General: Well Developed, well nourished, and in no acute distress.   MSK: Right hip normal-appearing Tender palpation greater trochanter.  Hip abduction strength is mildly decreased.  Right knee mild effusion normal.  Otherwise normal motion.  Left knee mild effusion normal motion    Lab and Radiology Results  Procedure: Real-time Ultrasound Guided Injection of right hip greater trochanter bursa Device: Philips Affiniti 50G/GE Logiq Images permanently stored and available for review in PACS Verbal informed consent obtained.  Discussed risks and benefits of procedure. Warned about infection, bleeding, hyperglycemia damage to structures among others. Patient expresses understanding and agreement Time-out conducted.   Noted no overlying erythema, induration, or other signs of local infection.   Skin prepped in a sterile fashion.   Local anesthesia: Topical Ethyl chloride.   With sterile technique and under real time ultrasound guidance: 40 mg of Depo-Medrol  and 2 mL of Marcaine  injected into greater trochanter bursa. Fluid seen entering the bursa.   Completed without  difficulty   Pain immediately resolved suggesting accurate placement of the medication.   Advised to call if fevers/chills, erythema, induration, drainage, or persistent bleeding.   Images permanently stored and available for review in the ultrasound unit.  Impression: Technically successful ultrasound guided injection.    Procedure: Real-time Ultrasound Guided Injection of right knee joint superior lateral patella space Device: Philips Affiniti 50G/GE Logiq Images permanently stored and available for review in PACS Verbal informed consent obtained.  Discussed risks and benefits of procedure. Warned about infection, bleeding, hyperglycemia damage to structures among others. Patient expresses understanding and agreement Time-out conducted.   Noted no overlying erythema, induration, or other signs of local infection.   Skin prepped in a sterile fashion.   Local anesthesia: Topical Ethyl chloride.   With sterile technique and under real time ultrasound guidance: 40 mg of Depo-Medrol  and 2 mL of Marcaine  injected into knee joint. Fluid seen entering the joint capsule.   Completed without difficulty   Pain immediately resolved suggesting accurate placement of the medication.   Advised to call if fevers/chills, erythema, induration, drainage, or persistent bleeding.   Images permanently stored and available for review in the ultrasound unit.  Impression: Technically successful ultrasound guided injection.   X-ray images bilateral knees obtained today personally and independently interpreted.  Right knee: Moderate lateral DJD.  No acute fractures.  Left knee: Mild DJD.  No acute fracture.  Metallic foreign body present medial proximal leg near tibia  Await formal radiology review    Assessment and Plan: 67 y.o. male with right lateral hip pain  due to greater trochanteric bursitis.  Plan for injection and physical therapy referral.  Bilateral knee pain right worse than left due to  exacerbation of DJD plan for steroid injection right knee.  Recheck in 1 week.  He really could have 3 injections today but his diabetes would cause significant hyperglycemia with 3 injections.  Will schedule back in a week and consider left knee injection that if needed.   PDMP not reviewed this encounter. Orders Placed This Encounter  Procedures   US  LIMITED JOINT SPACE STRUCTURES LOW RIGHT(NO LINKED CHARGES)    Reason for Exam (SYMPTOM  OR DIAGNOSIS REQUIRED):   right hip, B knees    Preferred imaging location?:   [facility] Knee AP/LAT W/Sunrise Right    Standing Status:   Future    Number of Occurrences:   1    Expiration Date:   01/07/2024    Reason for Exam (SYMPTOM  OR DIAGNOSIS REQUIRED):   bilat knee pain    Preferred imaging location?:   [facility]   DG Knee AP/LAT W/Sunrise Left    Standing Status:   Future    Number of Occurrences:   1    Expiration Date:   01/07/2024    Reason for Exam (SYMPTOM  OR DIAGNOSIS REQUIRED):   bilat knee pain    Preferred imaging location?:   [facility]   Ambulatory referral to Physical Therapy    Referral Priority:   Routine    Referral Type:   Physical Medicine    Referral Reason:   Specialty Services Required    Requested Specialty:   Physical Therapy    Number of Visits Requested:   1   No orders of the defined types were placed in this encounter.    Discussed warning signs or symptoms. Please see discharge instructions. Patient expresses understanding.   The above documentation has been reviewed and is accurate and complete Artist Lloyd, M.D.

## 2023-12-09 ENCOUNTER — Ambulatory Visit
Admission: RE | Admit: 2023-12-09 | Discharge: 2023-12-09 | Disposition: A | Discharge status: Home / Self Care | Source: Ambulatory Visit | Attending: Internal Medicine | Admitting: Internal Medicine

## 2023-12-09 DIAGNOSIS — R1084 Generalized abdominal pain: Secondary | ICD-10-CM

## 2023-12-09 MED ORDER — IOPAMIDOL (ISOVUE-370) INJECTION 76%
75.0000 mL | Freq: Once | INTRAVENOUS | Status: AC | PRN
  Administered 2023-12-09: 75 mL via INTRAVENOUS

## 2023-12-12 ENCOUNTER — Ambulatory Visit: Payer: Self-pay | Admitting: Family Medicine

## 2023-12-12 NOTE — Progress Notes (Signed)
 Left knee x-ray shows a little bit of swelling.

## 2023-12-12 NOTE — Progress Notes (Signed)
 Right knee x-ray shows mild arthritis.

## 2023-12-16 ENCOUNTER — Ambulatory Visit: Admitting: Podiatry

## 2023-12-16 DIAGNOSIS — B351 Tinea unguium: Secondary | ICD-10-CM | POA: Diagnosis not present

## 2023-12-16 DIAGNOSIS — M79675 Pain in left toe(s): Secondary | ICD-10-CM

## 2023-12-16 DIAGNOSIS — M79674 Pain in right toe(s): Secondary | ICD-10-CM
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
## 2023-12-17 ENCOUNTER — Other Ambulatory Visit: Payer: Self-pay | Admitting: Internal Medicine

## 2023-12-17 ENCOUNTER — Ambulatory Visit: Admitting: Family Medicine

## 2023-12-17 DIAGNOSIS — I10 Essential (primary) hypertension: Secondary | ICD-10-CM

## 2023-12-17 MED ORDER — AMLODIPINE BESYLATE 5 MG PO TABS: 5.0000 mg | ORAL_TABLET | Freq: Every day | ORAL | 0 refills | Status: AC

## 2023-12-17 NOTE — Telephone Encounter (Signed)
 Copied from CRM [id number]. Topic: Clinical - Medication Refill >> Dec 17, 2023  3:21 PM Gabriel Coleman ORN wrote: Medication:  amLODipine  (NORVASC ) 5 MG tablet  Has the patient contacted their pharmacy? Yes  This is the patient's preferred pharmacy:  [id number] - [place], [place] - [address] AT [facility] [address] [place] [address] Phone: [phone] Fax: [phone]   Is this the correct pharmacy for this prescription? Yes If no, delete pharmacy and type the correct one.   Has the prescription been filled recently? No  Is the patient out of the medication? No , 2 left   Has the patient been seen for an appointment in the last year OR does the patient have an upcoming appointment? Yes  Can we respond through MyChart? Yes

## 2024-02-02 ENCOUNTER — Encounter: Payer: Self-pay | Admitting: Internal Medicine

## 2024-02-02 ENCOUNTER — Ambulatory Visit: Admitting: Internal Medicine

## 2024-02-02 VITALS — BP 162/100 | Ht 70.5 in | Wt 242.0 lb

## 2024-02-02 DIAGNOSIS — Z7984 Long term (current) use of oral hypoglycemic drugs: Secondary | ICD-10-CM

## 2024-02-02 DIAGNOSIS — E119 Type 2 diabetes mellitus without complications: Secondary | ICD-10-CM | POA: Diagnosis not present

## 2024-02-02 DIAGNOSIS — E0590 Thyrotoxicosis, unspecified without thyrotoxic crisis or storm: Secondary | ICD-10-CM

## 2024-02-02 DIAGNOSIS — E1165 Type 2 diabetes mellitus with hyperglycemia: Secondary | ICD-10-CM

## 2024-02-02 LAB — POCT GLYCOSYLATED HEMOGLOBIN (HGB A1C): Hemoglobin A1C: 6.6 % — AB (ref 4.0–5.6)

## 2024-02-02 MED ORDER — METHIMAZOLE 5 MG PO TABS: 5.0000 mg | ORAL_TABLET | Freq: Every day | ORAL | 3 refills | Status: AC

## 2024-02-02 MED ORDER — METFORMIN HCL ER 500 MG PO TB24: ORAL_TABLET | ORAL | 3 refills | Status: AC

## 2024-02-02 MED ORDER — GLIPIZIDE ER 5 MG PO TB24: 5.0000 mg | ORAL_TABLET | Freq: Every day | ORAL | 3 refills | Status: AC
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
## 2024-02-02 NOTE — Patient Instructions (Signed)
 Continue Glipizide  5 mg XL, 1 tablet before Breakfast  Take Metformin  500 mg, 2 tablets before Breakfast and 2 tablets daily before supper     HOW TO TREAT LOW BLOOD SUGARS (Blood sugar LESS THAN 70 MG/DL) Please follow the RULE OF 15 for the treatment of hypoglycemia treatment (when your (blood sugars are less than 70 mg/dL)   STEP 1: Take 15 grams of carbohydrates when your blood sugar is low, which includes:  3-4 GLUCOSE TABS  OR 3-4 OZ OF JUICE OR REGULAR SODA OR ONE TUBE OF GLUCOSE GEL    STEP 2: RECHECK blood sugar in 15 MINUTES STEP 3: If your blood sugar is still low at the 15 minute recheck --> then, go back to STEP 1 and treat AGAIN with another 15 grams of carbohydrates.

## 2024-02-17 ENCOUNTER — Other Ambulatory Visit: Payer: Self-pay | Admitting: Internal Medicine

## 2024-02-17 NOTE — Telephone Encounter (Signed)
 Copied from CRM [id number]. Topic: Clinical - Medication Refill >> Feb 17, 2024  2:05 PM Alfonso HERO wrote: Medication: rosuvastatin  (CRESTOR ) 40 MG tablet  Has the patient contacted their pharmacy? No (Agent: If no, request that the patient contact the pharmacy for the refill. If patient does not wish to contact the pharmacy document the reason why and proceed with request.) (Agent: If yes, when and what did the pharmacy advise?)  This is the patient's preferred pharmacy:  [id number] - [place], [place] - [address] AT [facility] OF [address] [place] [address] Phone: [phone] Fax: [phone]  [id number] - [place] - [address] [place] [address] Phone: [phone] Fax: [phone]  Is this the correct pharmacy for this prescription? Yes If no, delete pharmacy and type the correct one.   Has the prescription been filled recently? Yes  Is the patient out of the medication? Yes  Has the patient been seen for an appointment in the last year OR does the patient have an upcoming appointment? Yes  Can we respond through MyChart? Yes  Agent: Please be advised that Rx refills may take up to 3 business days. We ask that you follow-up with your pharmacy.

## 2024-02-20 ENCOUNTER — Encounter: Payer: Self-pay | Admitting: Gastroenterology

## 2024-02-24 LAB — OPHTHALMOLOGY REPORT-SCANNED

## 2024-02-27 ENCOUNTER — Telehealth: Payer: Self-pay | Admitting: Internal Medicine

## 2024-02-27 NOTE — Telephone Encounter (Signed)
 Documentation left in providers inbox.

## 2024-02-27 NOTE — Telephone Encounter (Signed)
 Patient states that his insurance has made changes to his medication management in regards to the Accu-check supplies. Patient also asks if there are any samples of lancets that he might be able to use.

## 2024-03-02 ENCOUNTER — Ambulatory Visit

## 2024-03-02 VITALS — Ht 70.5 in | Wt 231.6 lb

## 2024-03-02 DIAGNOSIS — Z860100 Personal history of colon polyps, unspecified: Secondary | ICD-10-CM

## 2024-03-02 MED ORDER — NA SULFATE-K SULFATE-MG SULF 17.5-3.13-1.6 GM/177ML PO SOLN: 1.0000 | Freq: Once | ORAL | 0 refills | Status: AC

## 2024-03-02 NOTE — Progress Notes (Signed)
 PCP MD at time of PV: Norleen, MD __________________________________________________________________________________________________________________________________________  No egg allergy known to patient  No soy allergy known to patient No issues known to pt with past sedation with any surgeries or procedures Patient denies ever being told they had issues or difficulty with intubation  No FH of Malignant Hyperthermia Pt is not on diet pills Pt is not on  home 02  Pt is not on blood thinners  No A fib or A flutter Have any cardiac testing pending--no  LOA: independent  No Chew or Snuff tobacco __________________________________________________________________________________________________________________________________________  Constipation: no  Prep: suprep  __________________________________________________________________________________________________________________________________________  PV completed with patient. Prep instructions reviewed and provided during apt. Rx sent to preferred pharmacy.  __________________________________________________________________________________________________________________________________________  Patient's chart reviewed by Norleen Schillings CNRA prior to previsit and patient appropriate for the [facility].  Previsit completed and red dot placed by patient's name on their procedure day (on provider's schedule).

## 2024-03-02 NOTE — Telephone Encounter (Signed)
 Form has been placed on provider desk.

## 2024-03-05 ENCOUNTER — Other Ambulatory Visit: Payer: Self-pay | Admitting: Internal Medicine

## 2024-03-05 MED ORDER — ONETOUCH DELICA PLUS LANCET33G MISC: 3 refills | Status: AC

## 2024-03-05 MED ORDER — ACCU-CHEK GUIDE ME W/DEVICE KIT: PACK | 0 refills | Status: AC

## 2024-03-05 MED ORDER — ACCU-CHEK GUIDE TEST VI STRP: ORAL_STRIP | 12 refills | Status: AC

## 2024-03-09 ENCOUNTER — Ambulatory Visit

## 2024-03-10 ENCOUNTER — Telehealth: Payer: Self-pay

## 2024-03-10 ENCOUNTER — Ambulatory Visit

## 2024-03-10 VITALS — BP 148/78 | HR 86 | Ht 70.0 in | Wt 243.0 lb

## 2024-03-10 DIAGNOSIS — Z0000 Encounter for general adult medical examination without abnormal findings: Secondary | ICD-10-CM | POA: Diagnosis not present
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
## 2024-03-10 NOTE — Patient Instructions (Addendum)
 Gabriel Coleman,  Thank you for taking the time for your Medicare Wellness Visit. I appreciate your continued commitment to your health goals. Please review the care plan we discussed, and feel free to reach out if I can assist you further.  Please note that Annual Wellness Visits do not include a physical exam. Some assessments may be limited, especially if the visit was conducted virtually. If needed, we may recommend an in-person follow-up with your provider.  Ongoing Care Seeing your primary care provider every 3 to 6 months helps us  monitor your health and provide consistent, personalized care.   Referrals If a referral was made during today's visit and you haven't received any updates within two weeks, please contact the referred provider directly to check on the status.  Recommended Screenings:  Health Maintenance  Topic Date Due   Colon Cancer Screening  09/15/2022   Kidney health urinalysis for diabetes  05/28/2024   Complete foot exam   05/28/2024   Screening for Lung Cancer  07/16/2024   Hemoglobin A1C  08/02/2024   Yearly kidney function blood test for diabetes  11/27/2024   Eye exam for diabetics  02/23/2025   Medicare Annual Wellness Visit  03/10/2025   Pneumococcal Vaccine for age over 42 (3 of 3 - PCV20 or PCV21) 01/03/2026   DTaP/Tdap/Td vaccine (3 - Td or Tdap) 11/24/2032   Flu Shot  Completed   Hepatitis C Screening  Completed   Zoster (Shingles) Vaccine  Completed   Meningitis B Vaccine  Aged Out   Hepatitis B Vaccine  Discontinued   COVID-19 Vaccine  Discontinued       03/07/2023    1:46 PM  Advanced Directives  Does Patient Have a Medical Advance Directive? No  Would patient like information on creating a medical advance directive? No - Patient declined    Vision: Annual vision screenings are recommended for early detection of glaucoma, cataracts, and diabetic retinopathy. These exams can also reveal signs of chronic conditions such as diabetes and high blood  pressure.  Dental: Annual dental screenings help detect early signs of oral cancer, gum disease, and other conditions linked to overall health, including heart disease and diabetes.

## 2024-03-10 NOTE — Telephone Encounter (Signed)
 need more Accucheck Guide Lancets for the device. Was only sent #10. Send in Rx to [facility]. Appt w/Henson for TOC.

## 2024-03-11 NOTE — Telephone Encounter (Signed)
 This refill was sent on 03/05/24.

## 2024-03-24 ENCOUNTER — Ambulatory Visit: Admitting: Podiatry

## 2024-03-30 ENCOUNTER — Encounter: Admitting: Gastroenterology

## 2024-07-19 ENCOUNTER — Other Ambulatory Visit

## 2024-09-22 ENCOUNTER — Ambulatory Visit: Admitting: Family Medicine

## 2025-03-11 ENCOUNTER — Ambulatory Visit
